# Patient Record
Sex: Male | Born: 1945 | Race: White | Hispanic: No | Smoking: Current every day smoker
Health system: Southern US, Community
[De-identification: ages and names within clinical notes are randomized; demographics above are authoritative.]

## PROBLEM LIST (undated history)

## (undated) DIAGNOSIS — I619 Nontraumatic intracerebral hemorrhage, unspecified: Secondary | ICD-10-CM

## (undated) DIAGNOSIS — I10 Essential (primary) hypertension: Secondary | ICD-10-CM

## (undated) DIAGNOSIS — I639 Cerebral infarction, unspecified: Secondary | ICD-10-CM

## (undated) DIAGNOSIS — Z982 Presence of cerebrospinal fluid drainage device: Secondary | ICD-10-CM

## (undated) DIAGNOSIS — Z720 Tobacco use: Secondary | ICD-10-CM

## (undated) DIAGNOSIS — E785 Hyperlipidemia, unspecified: Secondary | ICD-10-CM

## (undated) HISTORY — PX: OTHER SURGICAL HISTORY: SHX169

## (undated) HISTORY — PX: VENTRICULOPERITONEAL SHUNT: SHX204

## (undated) HISTORY — DX: Cerebral infarction, unspecified: I63.9

---

## 2016-11-15 ENCOUNTER — Observation Stay (HOSPITAL_COMMUNITY)
Admission: EM | Admit: 2016-11-15 | Discharge: 2016-11-17 | Disposition: A | Discharge status: Home / Self Care | Payer: Medicare Other | Attending: Internal Medicine | Admitting: Internal Medicine

## 2016-11-15 ENCOUNTER — Encounter (HOSPITAL_COMMUNITY): Payer: Self-pay | Admitting: Internal Medicine

## 2016-11-15 ENCOUNTER — Emergency Department (HOSPITAL_COMMUNITY): Payer: Medicare Other

## 2016-11-15 DIAGNOSIS — K7290 Hepatic failure, unspecified without coma: Secondary | ICD-10-CM | POA: Insufficient documentation

## 2016-11-15 DIAGNOSIS — I700 Atherosclerosis of aorta: Secondary | ICD-10-CM | POA: Diagnosis not present

## 2016-11-15 DIAGNOSIS — F17200 Nicotine dependence, unspecified, uncomplicated: Secondary | ICD-10-CM | POA: Insufficient documentation

## 2016-11-15 DIAGNOSIS — Z7982 Long term (current) use of aspirin: Secondary | ICD-10-CM | POA: Insufficient documentation

## 2016-11-15 DIAGNOSIS — I69354 Hemiplegia and hemiparesis following cerebral infarction affecting left non-dominant side: Secondary | ICD-10-CM | POA: Diagnosis not present

## 2016-11-15 DIAGNOSIS — E785 Hyperlipidemia, unspecified: Secondary | ICD-10-CM | POA: Diagnosis not present

## 2016-11-15 DIAGNOSIS — Z91013 Allergy to seafood: Secondary | ICD-10-CM | POA: Insufficient documentation

## 2016-11-15 DIAGNOSIS — I4510 Unspecified right bundle-branch block: Secondary | ICD-10-CM | POA: Insufficient documentation

## 2016-11-15 DIAGNOSIS — I10 Essential (primary) hypertension: Secondary | ICD-10-CM | POA: Diagnosis present

## 2016-11-15 DIAGNOSIS — Z801 Family history of malignant neoplasm of trachea, bronchus and lung: Secondary | ICD-10-CM | POA: Insufficient documentation

## 2016-11-15 DIAGNOSIS — R441 Visual hallucinations: Secondary | ICD-10-CM

## 2016-11-15 DIAGNOSIS — I619 Nontraumatic intracerebral hemorrhage, unspecified: Secondary | ICD-10-CM | POA: Diagnosis not present

## 2016-11-15 DIAGNOSIS — Z8249 Family history of ischemic heart disease and other diseases of the circulatory system: Secondary | ICD-10-CM | POA: Diagnosis not present

## 2016-11-15 DIAGNOSIS — G9341 Metabolic encephalopathy: Secondary | ICD-10-CM | POA: Diagnosis not present

## 2016-11-15 DIAGNOSIS — Z720 Tobacco use: Secondary | ICD-10-CM | POA: Diagnosis present

## 2016-11-15 DIAGNOSIS — K7682 Hepatic encephalopathy: Secondary | ICD-10-CM

## 2016-11-15 DIAGNOSIS — G9389 Other specified disorders of brain: Secondary | ICD-10-CM | POA: Insufficient documentation

## 2016-11-15 DIAGNOSIS — R778 Other specified abnormalities of plasma proteins: Secondary | ICD-10-CM | POA: Insufficient documentation

## 2016-11-15 DIAGNOSIS — Z8673 Personal history of transient ischemic attack (TIA), and cerebral infarction without residual deficits: Secondary | ICD-10-CM

## 2016-11-15 DIAGNOSIS — Z79899 Other long term (current) drug therapy: Secondary | ICD-10-CM | POA: Diagnosis not present

## 2016-11-15 DIAGNOSIS — E876 Hypokalemia: Secondary | ICD-10-CM | POA: Diagnosis not present

## 2016-11-15 DIAGNOSIS — Z982 Presence of cerebrospinal fluid drainage device: Secondary | ICD-10-CM

## 2016-11-15 DIAGNOSIS — Z800 Family history of malignant neoplasm of digestive organs: Secondary | ICD-10-CM | POA: Insufficient documentation

## 2016-11-15 DIAGNOSIS — R7989 Other specified abnormal findings of blood chemistry: Secondary | ICD-10-CM | POA: Diagnosis present

## 2016-11-15 HISTORY — DX: Presence of cerebrospinal fluid drainage device: Z98.2

## 2016-11-15 HISTORY — DX: Tobacco use: Z72.0

## 2016-11-15 HISTORY — DX: Hyperlipidemia, unspecified: E78.5

## 2016-11-15 HISTORY — DX: Nontraumatic intracerebral hemorrhage, unspecified: I61.9

## 2016-11-15 HISTORY — DX: Essential (primary) hypertension: I10

## 2016-11-15 LAB — URINALYSIS, ROUTINE W REFLEX MICROSCOPIC
Bilirubin Urine: NEGATIVE
GLUCOSE, UA: NEGATIVE mg/dL
Ketones, ur: NEGATIVE mg/dL
NITRITE: NEGATIVE
Protein, ur: NEGATIVE mg/dL
SPECIFIC GRAVITY, URINE: 1.01 (ref 1.005–1.030)
pH: 6 (ref 5.0–8.0)

## 2016-11-15 LAB — COMPREHENSIVE METABOLIC PANEL
ALBUMIN: 3.5 g/dL (ref 3.5–5.0)
ALK PHOS: 142 U/L — AB (ref 38–126)
ALT: 12 U/L — ABNORMAL LOW (ref 17–63)
ANION GAP: 9 (ref 5–15)
AST: 14 U/L — ABNORMAL LOW (ref 15–41)
BILIRUBIN TOTAL: 0.8 mg/dL (ref 0.3–1.2)
BUN: 12 mg/dL (ref 6–20)
CALCIUM: 8.7 mg/dL — AB (ref 8.9–10.3)
CO2: 25 mmol/L (ref 22–32)
Chloride: 108 mmol/L (ref 101–111)
Creatinine, Ser: 1.13 mg/dL (ref 0.61–1.24)
GFR calc Af Amer: >60 mL/min (ref >60)
GFR calc non Af Amer: >60 mL/min (ref >60)
GLUCOSE: 95 mg/dL (ref 65–99)
Potassium: 2.9 mmol/L — ABNORMAL LOW (ref 3.5–5.1)
Sodium: 142 mmol/L (ref 135–145)
TOTAL PROTEIN: 6.4 g/dL — AB (ref 6.5–8.1)

## 2016-11-15 LAB — CBC WITH DIFFERENTIAL/PLATELET
BASOS PCT: 1 %
Basophils Absolute: 0.1 10*3/uL (ref 0.0–0.1)
EOS ABS: 0.2 10*3/uL (ref 0.0–0.7)
Eosinophils Relative: 3 %
HCT: 49.6 % (ref 39.0–52.0)
HEMOGLOBIN: 16.4 g/dL (ref 13.0–17.0)
Lymphocytes Relative: 31 %
Lymphs Abs: 3 10*3/uL (ref 0.7–4.0)
MCH: 28.4 pg (ref 26.0–34.0)
MCHC: 33.1 g/dL (ref 30.0–36.0)
MCV: 85.8 fL (ref 78.0–100.0)
MONOS PCT: 9 %
Monocytes Absolute: 0.8 10*3/uL (ref 0.1–1.0)
NEUTROS PCT: 56 %
Neutro Abs: 5.3 10*3/uL (ref 1.7–7.7)
PLATELETS: 268 10*3/uL (ref 150–400)
RBC: 5.78 MIL/uL (ref 4.22–5.81)
RDW: 13.9 % (ref 11.5–15.5)
WBC: 9.4 10*3/uL (ref 4.0–10.5)

## 2016-11-15 LAB — RAPID URINE DRUG SCREEN, HOSP PERFORMED
AMPHETAMINES: NOT DETECTED
BARBITURATES: NOT DETECTED
BENZODIAZEPINES: NOT DETECTED
Cocaine: NOT DETECTED
Opiates: NOT DETECTED
Tetrahydrocannabinol: NOT DETECTED

## 2016-11-15 LAB — PROTIME-INR
INR: 1.12
PROTHROMBIN TIME: 14.5 s (ref 11.4–15.2)

## 2016-11-15 LAB — ETHANOL: Alcohol, Ethyl (B): <5 mg/dL (ref <5)

## 2016-11-15 LAB — TROPONIN I: TROPONIN I: 0.03 ng/mL — AB (ref <0.03)

## 2016-11-15 LAB — AMMONIA: AMMONIA: 90 umol/L — AB (ref 9–35)

## 2016-11-15 MED ORDER — AMLODIPINE BESYLATE 5 MG PO TABS
10.0000 mg | ORAL_TABLET | Freq: Every day | ORAL | Status: DC
  Administered 2016-11-16 – 2016-11-17 (×2): 10 mg via ORAL
  Filled 2016-11-15 (×2): qty 2

## 2016-11-15 MED ORDER — ACETAMINOPHEN 325 MG PO TABS: 650.0000 mg | ORAL_TABLET | ORAL | Status: DC | PRN

## 2016-11-15 MED ORDER — ATORVASTATIN CALCIUM 40 MG PO TABS
40.0000 mg | ORAL_TABLET | Freq: Every day | ORAL | Status: DC
Start: 2016-11-16 — End: 2016-11-17
  Administered 2016-11-16: 40 mg via ORAL
  Filled 2016-11-15: qty 1

## 2016-11-15 MED ORDER — SENNOSIDES-DOCUSATE SODIUM 8.6-50 MG PO TABS: 1.0000 | ORAL_TABLET | Freq: Every evening | ORAL | Status: DC | PRN

## 2016-11-15 MED ORDER — POTASSIUM CHLORIDE CRYS ER 10 MEQ PO TBCR
10.0000 meq | EXTENDED_RELEASE_TABLET | Freq: Every day | ORAL | Status: DC
  Administered 2016-11-16 – 2016-11-17 (×2): 10 meq via ORAL
  Filled 2016-11-15 (×2): qty 1

## 2016-11-15 MED ORDER — POTASSIUM CHLORIDE 20 MEQ/15ML (10%) PO SOLN
40.0000 meq | Freq: Once | ORAL | Status: AC
Start: 2016-11-15 — End: 2016-11-15
  Administered 2016-11-15: 40 meq via ORAL
  Filled 2016-11-15: qty 30

## 2016-11-15 MED ORDER — POTASSIUM CHLORIDE 10 MEQ/100ML IV SOLN
10.0000 meq | INTRAVENOUS | Status: AC
  Administered 2016-11-15 (×3): 10 meq via INTRAVENOUS
  Filled 2016-11-15 (×3): qty 100

## 2016-11-15 MED ORDER — NICOTINE 21 MG/24HR TD PT24
21.0000 mg | MEDICATED_PATCH | Freq: Every day | TRANSDERMAL | Status: DC
Start: 2016-11-15 — End: 2016-11-17
  Administered 2016-11-17: 21 mg via TRANSDERMAL
  Filled 2016-11-15 (×3): qty 1

## 2016-11-15 MED ORDER — ZOLPIDEM TARTRATE 5 MG PO TABS: 5.0000 mg | ORAL_TABLET | Freq: Every evening | ORAL | Status: DC | PRN

## 2016-11-15 MED ORDER — LISINOPRIL 20 MG PO TABS
20.0000 mg | ORAL_TABLET | Freq: Every day | ORAL | Status: DC
  Administered 2016-11-16 – 2016-11-17 (×2): 20 mg via ORAL
  Filled 2016-11-15 (×2): qty 1

## 2016-11-15 MED ORDER — ACETAMINOPHEN 650 MG RE SUPP: 650.0000 mg | RECTAL | Status: DC | PRN

## 2016-11-15 MED ORDER — ASPIRIN EC 81 MG PO TBEC
81.0000 mg | DELAYED_RELEASE_TABLET | Freq: Every day | ORAL | Status: DC
  Administered 2016-11-16 – 2016-11-17 (×2): 81 mg via ORAL
  Filled 2016-11-15 (×2): qty 1

## 2016-11-15 MED ORDER — LACTULOSE 10 GM/15ML PO SOLN
20.0000 g | Freq: Three times a day (TID) | ORAL | Status: DC
  Administered 2016-11-15 – 2016-11-17 (×5): 20 g via ORAL
  Filled 2016-11-15 (×5): qty 30

## 2016-11-15 MED ORDER — FUROSEMIDE 20 MG PO TABS
20.0000 mg | ORAL_TABLET | Freq: Every day | ORAL | Status: DC
Start: 2016-11-16 — End: 2016-11-17
  Administered 2016-11-16 – 2016-11-17 (×2): 20 mg via ORAL
  Filled 2016-11-15 (×2): qty 1

## 2016-11-15 MED ORDER — BACLOFEN 10 MG PO TABS: 10.0000 mg | ORAL_TABLET | Freq: Three times a day (TID) | ORAL | Status: DC

## 2016-11-15 MED ORDER — MAGNESIUM SULFATE IN D5W 1-5 GM/100ML-% IV SOLN
1.0000 g | Freq: Once | INTRAVENOUS | Status: AC
  Administered 2016-11-15: 1 g via INTRAVENOUS
  Filled 2016-11-15: qty 100

## 2016-11-15 MED ORDER — ACETAMINOPHEN 160 MG/5ML PO SOLN: 650.0000 mg | ORAL | Status: DC | PRN

## 2016-11-15 MED ORDER — ENOXAPARIN SODIUM 40 MG/0.4ML ~~LOC~~ SOLN
40.0000 mg | SUBCUTANEOUS | Status: DC
  Administered 2016-11-16: 40 mg via SUBCUTANEOUS
  Filled 2016-11-15 (×2): qty 0.4

## 2016-11-15 MED ORDER — ONDANSETRON HCL 4 MG/2ML IJ SOLN: 4.0000 mg | Freq: Three times a day (TID) | INTRAMUSCULAR | Status: DC | PRN

## 2016-11-15 MED ORDER — STROKE: EARLY STAGES OF RECOVERY BOOK
Freq: Once | Status: AC
  Administered 2016-11-16
  Filled 2016-11-15 (×2): qty 1

## 2016-11-15 MED ORDER — SODIUM CHLORIDE 0.9 % IV SOLN
INTRAVENOUS | Status: DC
  Administered 2016-11-15: via INTRAVENOUS

## 2016-11-15 MED ORDER — METOPROLOL TARTRATE 25 MG PO TABS
100.0000 mg | ORAL_TABLET | Freq: Two times a day (BID) | ORAL | Status: DC
  Administered 2016-11-16 – 2016-11-17 (×4): 100 mg via ORAL
  Filled 2016-11-15 (×4): qty 4

## 2016-11-15 MED ORDER — HYDRALAZINE HCL 20 MG/ML IJ SOLN: 5.0000 mg | INTRAMUSCULAR | Status: DC | PRN

## 2016-11-15 MED ORDER — IOPAMIDOL (ISOVUE-370) INJECTION 76%
INTRAVENOUS | Status: AC
  Filled 2016-11-15: qty 50

## 2016-11-15 NOTE — ED Triage Notes (Signed)
Pt. Coming from home via [facility] for odd behavior x3 days. Pt. Neighbor states he is not at baseline. Pt. Hx of stroke with left-sided deficits. Pt. Takes care of himself at home with no issues usually. Pt. Hallucinating yesterday stating there's bugs all over the floor and foam coming from his baseboards. Pt. Aox4 and follows commands. EMS reports 12 lead RBBB.

## 2016-11-15 NOTE — ED Provider Notes (Signed)
[facility]EMERGENCY DEPT Provider Note   CSN: [id number] Arrival date & time: 11/15/16  1324     History   Chief Complaint Chief Complaint  Patient presents with  . Altered Mental Status    HPI Joel Solis is a 71 y.o. male.  Joel Solis is a 71 y.o. Male with a history of CVA with left sided deficits who  After home health reports patient has not been acting normally recently. According to home health patient has  Had visual hallucinations of bugs and found coming from his floorboards. Patient reports he is feeling totally fine. He denies any complaints. He does report to me that he has seen bugs and foam coming out of his floorboards. He reports millions of bugs from his floorboards. He tells me no else has seen them because they leave when other people are looking for them. 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 He denies any physical complaints. He denies fevers, chest pain, shortness of breath, abdominal pain, nausea, vomiting, urinary symptoms, rashes, auditory hallucinations.    The history is provided by the patient and the EMS personnel. No language interpreter was used.  Altered Mental Status   Associated symptoms include hallucinations.    No past medical history on file.  There are no active problems to display for this patient.   No past surgical history on file.     Home Medications    Prior to Admission medications   Not on File    Family History No family history on file.  Social History Social History  Substance Use Topics  . Smoking status: Not on file  . Smokeless tobacco: Not on file  . Alcohol use Not on file     Allergies   Shellfish allergy   Review of Systems Review of Systems  Constitutional: Negative for chills and fever.  HENT: Negative for congestion and sore throat.   Eyes: Negative for visual disturbance.  Respiratory: Negative for cough, shortness of breath and wheezing.   Cardiovascular: Negative for chest  pain.  Gastrointestinal: Negative for abdominal pain, diarrhea, nausea and vomiting.  Genitourinary: Negative for dysuria.  Musculoskeletal: Negative for back pain and neck pain.  Skin: Negative for rash.  Neurological: Negative for headaches.  Psychiatric/Behavioral: Positive for hallucinations. Negative for suicidal ideas.     Physical Exam Updated Vital Signs BP 121/80   Pulse (!) 57   Resp 17   SpO2 100%   Physical Exam  Constitutional: He is oriented to person, place, and time. He appears well-developed and well-nourished. No distress.  Nontoxic appearing.  HENT:  Head: Normocephalic and atraumatic.  Mouth/Throat: Oropharynx is clear and moist.  Eyes: Conjunctivae are normal. Pupils are equal, round, and reactive to light. Right eye exhibits no discharge. Left eye exhibits no discharge.  Neck: Neck supple. No JVD present.  Cardiovascular: Normal rate, regular rhythm, normal heart sounds and intact distal pulses.   Pulmonary/Chest: Effort normal and breath sounds normal. No stridor. No respiratory distress. He has no wheezes. He has no rales.  Lungs are clear to ascultation bilaterally. Symmetric chest expansion bilaterally. No increased work of breathing. No rales or rhonchi.    Abdominal: Soft. There is no tenderness. There is no guarding.  Musculoskeletal: He exhibits no edema.  Lymphadenopathy:    He has no cervical adenopathy.  Neurological: He is alert and oriented to person, place, and time. No cranial nerve deficit. Coordination normal.  Patient is alert and oriented 3. Speech is clear and coherent.  His left arm and leg weakness which is chronic from her previous stroke. No facial droop.  Skin: Skin is warm and dry. Capillary refill takes less than 2 seconds. No rash noted. He is not diaphoretic. No erythema. No pallor.  Psychiatric: He has a normal mood and affect. His behavior is normal. Thought content is not paranoid. He expresses no homicidal and no suicidal  ideation.   His speech is clear and coherent. He does endorse seeing foam and no evidence of bugs coming from his floorboards at home. He denies any current hallucinations. He denies auditory hallucinations. He does not appear to be responding to internal stimuli.  Nursing note and vitals reviewed.    ED Treatments / Results  Labs (all labs ordered are listed, but only abnormal results are displayed) Labs Reviewed  COMPREHENSIVE METABOLIC PANEL - Abnormal; Notable for the following:       Result Value   Potassium 2.9 (*)    Calcium 8.7 (*)    Total Protein 6.4 (*)    AST 14 (*)    ALT 12 (*)    Alkaline Phosphatase 142 (*)    All other components within normal limits  URINALYSIS, ROUTINE W REFLEX MICROSCOPIC - Abnormal; Notable for the following:    Hgb urine dipstick SMALL (*)    Leukocytes, UA SMALL (*)    Bacteria, UA RARE (*)    Squamous Epithelial / LPF 0-5 (*)    Non Squamous Epithelial 0-5 (*)    All other components within normal limits  AMMONIA - Abnormal; Notable for the following:    Ammonia 90 (*)    All other components within normal limits  ETHANOL  CBC WITH DIFFERENTIAL/PLATELET  RAPID URINE DRUG SCREEN, HOSP PERFORMED    EKG  EKG Interpretation  Date/Time:  Tuesday November 15 2016 13:47:12 EDT Ventricular Rate:  59 PR Interval:    QRS Duration: 158 QT Interval:  478 QTC Calculation: 474 R Axis:   68 Text Interpretation:  Sinus rhythm Prolonged PR interval Right bundle branch block Borderline ST depression, lateral leads No previous ECGs available Confirmed by Richardean Canal [phone]) on 11/15/2016 1:49:34 PM Also confirmed by Richardean Canal [phone]), editor Misty Stanley [phone])  on 11/15/2016 1:57:46 PM       Radiology Dg Chest 2 View  Result Date: 11/15/2016 CLINICAL DATA:  Mental status change which is unusual for the patient. EXAM: CHEST  2 VIEW COMPARISON:  None in PACs FINDINGS: The lungs are adequately inflated and clear. The heart is top-normal  in size. The pulmonary vascularity is normal. The mediastinum is normal in width. There is no pleural effusion. There is calcification in the wall of the aortic arch. The bony thorax is unremarkable. IMPRESSION: Top-normal cardiac size without evidence of pulmonary edema. Thoracic aortic atherosclerosis. Electronically Signed   By: David  Swaziland M.D.   On: 11/15/2016 14:25   Ct Head Wo Contrast  Result Date: 11/15/2016 CLINICAL DATA:  Altered mental status, some left-sided weakness since prior stroke EXAM: CT HEAD WITHOUT CONTRAST TECHNIQUE: Contiguous axial images were obtained from the base of the skull through the vertex without intravenous contrast. COMPARISON:  None. FINDINGS: Brain: The ventricular system his min prominent as are the cortical sulci indicating diffuse atrophy. Ventriculostomy catheter enters from the right frontal region with the tip in the posterior aspect of the right lateral ventricle. The septum is midline in position. Moderate small vessel ischemic changes noted throughout the periventricular white matter. Vascular: No vascular abnormality  is noted on this unenhanced study. Skull: On bone window images, no calvarial abnormality is seen. Sinuses/Orbits: Paranasal sinuses appear clear. Other: None. IMPRESSION: Atrophy and moderate small vessel ischemic change. VP shunt present. No acute intracranial abnormality. Electronically Signed   By: Dwyane DeePaul  Barry M.D.   On: 11/15/2016 14:55    Procedures Procedures (including critical care time)  Medications Ordered in ED Medications  potassium chloride 10 mEq in 100 mL IVPB (10 mEq Intravenous New Bag/Given 11/15/16 1605)     Initial Impression / Assessment and Plan / ED Course  I have reviewed the triage vital signs and the nursing notes.  Pertinent labs & imaging results that were available during my care of the patient were reviewed by me and considered in my medical decision making (see chart for details).    This  is a 71 y.o.  Male with a history of CVA with left sided deficits who  After home health reports patient has not been acting normally recently. According to home health patient has  Had visual hallucinations of bugs and found coming from his floorboards. Patient reports he is feeling totally fine. He denies any complaints. He does report to me that he has seen bugs and foam coming out of his floorboards. He reports millions of bugs from his floorboards. He tells me no else has seen them because they leave when other people are looking for them. On exam patient is afebrile nontoxic appearing. He is alert and oriented 4. He does report seeing these bugs at home. He has no active hallucinations. He does not appear to be responded to internal stimuli. He is pleasant.  Workup here is remarkable for an elevated ammonia level. This may be contributing to his symptoms. CT of his head does show a VP shunt. Plan is for MRI to rule out any new stroke. Plan for admission.  At shift change patient care is signed out to oncoming team. Awaiting admission.   Final Clinical Impressions(s) / ED Diagnoses   Final diagnoses:  Visual hallucinations  History of CVA (cerebrovascular accident)  S/P VP shunt  Hepatic encephalopathy (HCC)  Increased ammonia level    New Prescriptions New Prescriptions   No medications on file     Everlene FarrierDansie, Brylin Stopper, Cordelia Poche-C 11/15/16 1715    Charlynne PanderYao, David Hsienta, MD 11/17/16 [phone]

## 2016-11-15 NOTE — ED Notes (Signed)
Pt signed consent to receive medical records from [place] in [place].

## 2016-11-15 NOTE — ED Notes (Signed)
Medical records sent to radiology for radiologist to review to see if pt is able to get MRI done with shunt in brain

## 2016-11-15 NOTE — ED Notes (Signed)
Gave patient a urinal patient is resting 

## 2016-11-15 NOTE — H&P (Addendum)
History and Physical    Joel PearsonDaniel Cooprider ZOX:[id number] DOB: [birth year] DOA: 11/15/2016  Referring MD/NP/PA:   PCP: [facility]   Patient coming from:  The patient is coming from home.  At baseline, pt is independent for most of ADL.   Chief Complaint: confusion and hallucination  HPI: Joel Solis is a 71 y.o. male with medical history significant of hemorrhagic stroke with left-sided weakness and a mild slurred speech, s/p of VP shunt, hypertension, hyperlipidemia, who presents with confusion and hallucination.  Per pt's daughter, pt has been confused in the past 3 days. He has abnormal behavior and hallucination. He states that there are bugs all over the floor and foam coming from his baseboards. He has chronic left-sided weakness and mild slurred speech from previous stroke, which has not changed. Patient does not have new vision change, hearing loss. No new weakness or numbness in his extremities. Patient does not have chest pain, SOB, cough, fever or chills. No nausea, vomiting, diarrhea, abdominal pain, symptoms of UTI. When I saw pt in ED, he is oriented x 3.   ED Course: pt was found to have WBC 9.4, negative UDS, alcohol level less than 5, ammonia 90, LFT normal, potassium 2.9, creatinine 1.13, urinalysis with small amount of leukocytes, chest x-rays negative, oxygen saturation 99% on room air. CT head is negative for acute intracranial abnormalities, but showed atrophy and moderate small vessel ischemic change and VP shunt.  Review of Systems:   General: no fevers, chills, no changes in body weight, has fatigue HEENT: no blurry vision, hearing changes or sore throat Respiratory: no dyspnea, coughing, wheezing CV: no chest pain, no palpitations GI: no nausea, vomiting, abdominal pain, diarrhea, constipation GU: no dysuria, burning on urination, increased urinary frequency, hematuria  Ext: no leg edema Neuro: has left sided weakness. Has hallucination  and confusion. No vision change or hearing loss Skin: no rash, no skin tear. MSK: No muscle spasm, no deformity, no limitation of range of movement in spin Heme: No easy bruising.  Travel history: No recent long distant travel.  Allergy:  Allergies  Allergen Reactions  . Shellfish Allergy Nausea And Vomiting    Past Medical History:  Diagnosis Date  . Essential hypertension   . Hemorrhagic stroke (HCC)   . HLD (hyperlipidemia)   . S/P VP shunt   . Tobacco abuse     Past Surgical History:  Procedure Laterality Date  . cyst      cyst in neck  . VENTRICULOPERITONEAL SHUNT      Social History:  reports that he has been smoking.  He has never used smokeless tobacco. He reports that he drinks alcohol. He reports that he does not use drugs.  Family History:  Family History  Problem Relation Age of Onset  . Intracerebral hemorrhage Mother   . Lung cancer Father   . Esophageal cancer Brother      Prior to Admission medications   Medication Sig Start Date End Date Taking? Authorizing Provider  amLODipine (NORVASC) 10 MG tablet Take 10 mg by mouth daily.   Yes [provider]  aspirin EC 81 MG tablet Take 81 mg by mouth daily.   Yes [provider]  atorvastatin (LIPITOR) 40 MG tablet Take 40 mg by mouth daily.   Yes [provider]  baclofen (LIORESAL) 10 MG tablet Take 10 mg by mouth 3 (three) times daily.   Yes [provider]  furosemide (LASIX) 20 MG tablet Take 20 mg  by mouth.   Yes [provider]  lisinopril (PRINIVIL,ZESTRIL) 20 MG tablet Take 20 mg by mouth daily.   Yes [provider]  metoprolol tartrate (LOPRESSOR) 100 MG tablet Take 100 mg by mouth 2 (two) times daily.   Yes [provider]  potassium chloride (K-DUR,KLOR-CON) 10 MEQ tablet Take 10 mEq by mouth daily.   Yes [provider]    Physical Exam: Vitals:   11/15/16 2142 11/15/16 2144 11/15/16 2226 11/16/16 0001  BP:  138/78 (!)  150/72 (!) 150/80  Pulse:  67 61 67  Resp:  12 18   Temp: 97.8 F (36.6 C)  97.8 F (36.6 C)   TempSrc:   Oral   SpO2:  100% 99%   Weight:   90.9 kg (200 lb 8 oz)   Height:   5\' 8"  (1.727 m)    General: Not in acute distress HEENT:       Eyes: PERRL, EOMI, no scleral icterus.       ENT: No discharge from the ears and nose, no pharynx injection, no tonsillar enlargement.        Neck: No JVD, no bruit, no mass felt. Heme: No neck lymph node enlargement. Cardiac: S1/S2, RRR, No murmurs, No gallops or rubs. Respiratory: No rales, wheezing, rhonchi or rubs. GI: Soft, nondistended, nontender, no rebound pain, no organomegaly, BS present. GU: No hematuria Ext: No pitting leg edema bilaterally. 2+DP/PT pulse bilaterally. Musculoskeletal: No joint deformities, No joint redness or warmth, no limitation of ROM in spin. Skin: No rashes.  Neuro: Alert, oriented X3, cranial nerves II-XII grossly intact. Muscle strength 1/5 in left arm and 4/5 in left leg, 5/5 in right extremities, sensation to light touch intact. Brachial reflex 2+ bilaterally Psych: Patient is not psychotic, no suicidal or hemocidal ideation.  Labs on Admission: I have personally reviewed following labs and imaging studies  CBC:  Recent Labs Lab 11/15/16 1400  WBC 9.4  NEUTROABS 5.3  HGB 16.4  HCT 49.6  MCV 85.8  PLT 268   Basic Metabolic Panel:  Recent Labs Lab 11/15/16 1400  NA 142  K 2.9*  CL 108  CO2 25  GLUCOSE 95  BUN 12  CREATININE 1.13  CALCIUM 8.7*   GFR: Estimated Creatinine Clearance: 65.6 mL/min (by C-G formula based on SCr of 1.13 mg/dL). Liver Function Tests:  Recent Labs Lab 11/15/16 1400  AST 14*  ALT 12*  ALKPHOS 142*  BILITOT 0.8  PROT 6.4*  ALBUMIN 3.5   No results for input(s): LIPASE, AMYLASE in the last 168 hours.  Recent Labs Lab 11/15/16 1601  AMMONIA 90*   Coagulation Profile:  Recent Labs Lab 11/15/16 2045  INR 1.12   Cardiac Enzymes:  Recent Labs Lab  11/15/16 2052  TROPONINI 0.03*   BNP (last 3 results) No results for input(s): PROBNP in the last 8760 hours. HbA1C: No results for input(s): HGBA1C in the last 72 hours. CBG: No results for input(s): GLUCAP in the last 168 hours. Lipid Profile: No results for input(s): CHOL, HDL, LDLCALC, TRIG, CHOLHDL, LDLDIRECT in the last 72 hours. Thyroid Function Tests: No results for input(s): TSH, T4TOTAL, FREET4, T3FREE, THYROIDAB in the last 72 hours. Anemia Panel: No results for input(s): VITAMINB12, FOLATE, FERRITIN, TIBC, IRON, RETICCTPCT in the last 72 hours. Urine analysis:    Component Value Date/Time   COLORURINE YELLOW 11/15/2016 1606   APPEARANCEUR CLEAR 11/15/2016 1606   LABSPEC 1.010 11/15/2016 1606   PHURINE 6.0 11/15/2016 1606  GLUCOSEU NEGATIVE 11/15/2016 1606   HGBUR SMALL (A) 11/15/2016 1606   BILIRUBINUR NEGATIVE 11/15/2016 1606   KETONESUR NEGATIVE 11/15/2016 1606   PROTEINUR NEGATIVE 11/15/2016 1606   NITRITE NEGATIVE 11/15/2016 1606   LEUKOCYTESUR SMALL (A) 11/15/2016 1606   Sepsis Labs: @LABRCNTIP (procalcitonin:4,lacticidven:4) )No results found for this or any previous visit (from the past 240 hour(s)).   Radiological Exams on Admission: Dg Chest 2 View  Result Date: 11/15/2016 CLINICAL DATA:  Mental status change which is unusual for the patient. EXAM: CHEST  2 VIEW COMPARISON:  None in PACs FINDINGS: The lungs are adequately inflated and clear. The heart is top-normal in size. The pulmonary vascularity is normal. The mediastinum is normal in width. There is no pleural effusion. There is calcification in the wall of the aortic arch. The bony thorax is unremarkable. IMPRESSION: Top-normal cardiac size without evidence of pulmonary edema. Thoracic aortic atherosclerosis. Electronically Signed   By: David  [place]iland M.D.   On: 11/15/2016 14:25   Ct Head Wo Contrast  Result Date: 11/15/2016 CLINICAL DATA:  Altered mental status, some left-sided weakness since prior  stroke EXAM: CT HEAD WITHOUT CONTRAST TECHNIQUE: Contiguous axial images were obtained from the base of the skull through the vertex without intravenous contrast. COMPARISON:  None. FINDINGS: Brain: The ventricular system his min prominent as are the cortical sulci indicating diffuse atrophy. Ventriculostomy catheter enters from the right frontal region with the tip in the posterior aspect of the right lateral ventricle. The septum is midline in position. Moderate small vessel ischemic changes noted throughout the periventricular white matter. Vascular: No vascular abnormality is noted on this unenhanced study. Skull: On bone window images, no calvarial abnormality is seen. Sinuses/Orbits: Paranasal sinuses appear clear. Other: None. IMPRESSION: Atrophy and moderate small vessel ischemic change. VP shunt present. No acute intracranial abnormality. Electronically Signed   By: Dwyane Dee M.D.   On: 11/15/2016 14:55     EKG: Independently reviewed.  Sinus rhythm, QTC 474, right bundle blockage, ST depression in inferior leads, V3-V5  Assessment/Plan Principal Problem:   Acute metabolic encephalopathy Active Problems:   Essential hypertension   HLD (hyperlipidemia)   Hemorrhagic stroke (HCC)   S/P VP shunt   Tobacco abuse   Hypokalemia   Elevated troponin   Acute metabolic encephalopathy: Etiology is not clear. UDS negative, alcohol level less than 5. CT head is negative for acute intracranial abnormalities. Differential diagnosis includes frontal lobe stroke and delirium. His ammonia level is elevated at 90, but liver function is okay, does not seem to have hepatic encephalopathy. Pt cannot do MRI of the brain due to presents of VP shunt.  -will admit to tele bed for obs -will get CTA of neck and head-->if negative, may consult [facility] in AM -swallowing screen. -PT/OT -start Lactulose 20 g tid -hold baclofen  Addendum: per RN report, radiologist Dr. Chase Picket approved the MRI to be done for this  patient with the shunt.  -will get MRI of brain instead of CTA of head and neck.  Elevated trop: trop 0.03. No CP. - cycle CE q6 x3 and repeat EKG in the am  - prn Nitroglycerin, and aspirin, lipitor, metoprolol - Risk factor stratification: will check FLP and A1C  - 2d echo  Hemorrhagic stroke: has sequela of left sided weakness and mild slurred speech -continue ASA and lipitor  HLD: -Lipitor  Hypokalemia: K= 2.9 on admission.  - Repleted K - Give 1 gram of magnesium sulfate - Check Mg level  Essential hypertension: -continue  amlodipine, lisinopril, metoprolol, lasix -IV hydralazine when necessary  Tobacco abuse: -Did counseling about importance of quitting smoking -Nicotine patch   DVT ppx: SQ Lovenox Code Status: Full code Family Communication:  Yes, patient's daughter and son-in law at bed side Disposition Plan:  Anticipate discharge back to previous home environment Consults called:  none Admission status: Obs / tele    Date of Service 11/16/2016    Lorretta Harp [facility] Pager [phone]  If 7PM-7AM, please contact night-coverage [email or web address] Password [facility] 11/16/2016, 12:38 AM

## 2016-11-15 NOTE — ED Notes (Signed)
Patient transported to X-ray 

## 2016-11-15 NOTE — ED Provider Notes (Signed)
Received transfer of care at end of shift from Will Dansie, PA-C pending MRI brain and TTS eval. Patient was seen today for altered mental status and hallucinations. He is currently asymptomatic and alert and oriented.  MRI ordered to rule out new CVA.  Ammonia returned elevated and patient will need to be admitted for hepatic encephalopathy. Plan for admission Spoke to Dr. Susie CassetteAbrol who will be admitting patient. Patient is refusing to be admitted but agrees to MRI and will accept admission if anything abnormal on MRI. Patient eventually accepted admission pending MRI brain.  Georgiana ShoreMitchell, Khayden Herzberg B, PA-C 11/15/16 1741    Georgiana ShoreMitchell, Damareon Lanni B, PA-C 11/16/16 0129    Vanetta MuldersZackowski, Scott, MD 11/20/16 [phone]

## 2016-11-15 NOTE — ED Notes (Signed)
Admitting provider at bedside.

## 2016-11-15 NOTE — Progress Notes (Addendum)
Patient arrived to floor with plan for MRI of brain ordered by Robinette HainesJessica Mitchell,PA. This MRI had been cancelled multiple times throughout the day and finally placed again at 2127. Pt has a VP shunt placed in his brain. RN called Niu,MD and questioned MD about this test. MD asked me who ordered this test and this RN told him that Robinette HainesJessica Mitchell,PA had ordered the MRI. MD informed RN to find out the name of the radiologist who approved the MRI and make a note about it due to the risks related to having a shunt and going through the MRI machine. This RN called MRI and spoke with Aundra MilletMegan about the orders for this MRI and about the patient's shunt. Megan told this RN that since a CT of the head had already been, the MD can d/c this order and that the radiologist had approved the MRI for this patient with a shunt. Megan told me that the radiologist Dr.Herman approved the MRI to be done for this patient with the shunt. Niu,MD notified and he told this RN that he would prefer to have the MRI done in addition to the CT of the head because it gives more information. Charge RN Hilda LiasMarie is aware of this situation.

## 2016-11-16 ENCOUNTER — Observation Stay (HOSPITAL_COMMUNITY): Payer: Medicare Other

## 2016-11-16 DIAGNOSIS — I10 Essential (primary) hypertension: Secondary | ICD-10-CM | POA: Diagnosis not present

## 2016-11-16 DIAGNOSIS — E876 Hypokalemia: Secondary | ICD-10-CM

## 2016-11-16 DIAGNOSIS — R748 Abnormal levels of other serum enzymes: Secondary | ICD-10-CM

## 2016-11-16 DIAGNOSIS — G9341 Metabolic encephalopathy: Secondary | ICD-10-CM | POA: Diagnosis not present

## 2016-11-16 DIAGNOSIS — R7989 Other specified abnormal findings of blood chemistry: Secondary | ICD-10-CM

## 2016-11-16 DIAGNOSIS — R778 Other specified abnormalities of plasma proteins: Secondary | ICD-10-CM | POA: Diagnosis present

## 2016-11-16 LAB — BASIC METABOLIC PANEL
Anion gap: 6 (ref 5–15)
BUN: 8 mg/dL (ref 6–20)
CALCIUM: 8.6 mg/dL — AB (ref 8.9–10.3)
CO2: 24 mmol/L (ref 22–32)
CREATININE: 1.02 mg/dL (ref 0.61–1.24)
Chloride: 110 mmol/L (ref 101–111)
GFR calc Af Amer: >60 mL/min (ref >60)
GLUCOSE: 96 mg/dL (ref 65–99)
Potassium: 3.3 mmol/L — ABNORMAL LOW (ref 3.5–5.1)
Sodium: 140 mmol/L (ref 135–145)

## 2016-11-16 LAB — TROPONIN I
TROPONIN I: 0.03 ng/mL — AB (ref <0.03)
TROPONIN I: 0.04 ng/mL — AB (ref <0.03)

## 2016-11-16 LAB — MAGNESIUM: Magnesium: 2.1 mg/dL (ref 1.7–2.4)

## 2016-11-16 LAB — LIPID PANEL
CHOL/HDL RATIO: 3.3 ratio
Cholesterol: 66 mg/dL (ref 0–200)
HDL: 20 mg/dL — ABNORMAL LOW (ref >40)
LDL CALC: 28 mg/dL (ref 0–99)
Triglycerides: 88 mg/dL (ref <150)
VLDL: 18 mg/dL (ref 0–40)

## 2016-11-16 MED ORDER — NITROGLYCERIN 0.4 MG SL SUBL: 0.4000 mg | SUBLINGUAL_TABLET | SUBLINGUAL | Status: DC | PRN

## 2016-11-16 MED ORDER — POTASSIUM CHLORIDE CRYS ER 20 MEQ PO TBCR
40.0000 meq | EXTENDED_RELEASE_TABLET | Freq: Once | ORAL | Status: AC
  Administered 2016-11-16: 40 meq via ORAL
  Filled 2016-11-16: qty 2

## 2016-11-16 NOTE — Progress Notes (Signed)
EEG Completed; Results Pending  

## 2016-11-16 NOTE — Procedures (Signed)
ELECTROENCEPHALOGRAM REPORT  Date of Study: 11/16/2016  Patient's Name: Joel PearsonDaniel Solis MRN: [id number] Date of Birth: [birth year]  Referring Provider: Dr. Osvaldo ShipperGokul Krishnan  Clinical History: This is a 71 year old man with altered mental status.  Medications: acetaminophen (TYLENOL) tablet 650 mg  amLODipine (NORVASC) tablet 10 mg  aspirin EC tablet 81 mg  atorvastatin (LIPITOR) tablet 40 mg  enoxaparin (LOVENOX) injection 40 mg  furosemide (LASIX) tablet 20 mg  hydrALAZINE (APRESOLINE) injection 5 mg  lactulose (CHRONULAC) 10 GM/15ML solution 20 g  lisinopril (PRINIVIL,ZESTRIL) tablet 20 mg  metoprolol tartrate (LOPRESSOR) tablet 100 mg  nicotine (NICODERM CQ - dosed in mg/24 hours) patch 21 mg  nitroGLYCERIN (NITROSTAT) SL tablet 0.4 mg  ondansetron (ZOFRAN) injection 4 mg  potassium chloride (K-DUR,KLOR-CON) CR tablet 10 mEq  senna-docusate (Senokot-S) tablet 1 tablet  zolpidem (AMBIEN) tablet 5 mg   Technical Summary: A multichannel digital EEG recording measured by the international 10-20 system with electrodes applied with paste and impedances below 5000 ohms performed as portable with EKG monitoring in an awake and asleep patient.  Hyperventilation and photic stimulation were not performed.  The digital EEG was referentially recorded, reformatted, and digitally filtered in a variety of bipolar and referential montages for optimal display.   Description: The patient is awake and asleep during the recording. He is noted to be confused and agitated. During maximal wakefulness, there is a symmetric, medium voltage 8 Hz posterior dominant rhythm that poorly attenuates with eye opening and eye closure. This is admixed with a small amount of diffuse 4-5 Hz theta slowing and occasional 2-3 Hz delta slowing of the waking background. There is independent occasional 2-3 Hz focal slowing seen over the bilateral temporal regions.  During drowsiness and sleep, there is an increase in theta and  delta slowing of the background, with poorly formed vertex waves seen.  Hyperventilation and photic stimulation were not performed.  There were no epileptiform discharges or electrographic seizures seen.    EKG lead showed sinus bradycardia.  Impression: This awake and asleep EEG is abnormal due the presence of: 1. Mild to moderate diffuse slowing of the background 2. Occasional focal slowing over the bilateral temporal regions  Clinical Correlation of the above findings indicates diffuse cerebral dysfunction that is non-specific in etiology and can be seen with hypoxic/ischemic injury, toxic/metabolic encephalopathies, neurodegenerative disorders, or medication effect. Focal slowing over the bilateral temporal regions indicates focal cerebral dysfunction in these regions suggestive of underlying structural or physiologic abnormality. The absence of epileptiform discharges does not rule out a clinical diagnosis of epilepsy.  Clinical correlation is advised.   Patrcia DollyKaren Latara Micheli, M.D.

## 2016-11-16 NOTE — Evaluation (Addendum)
Physical Therapy Evaluation Patient Details Name: Joel PearsonDaniel Schoenbeck MRN: [id number] DOB: [birth year] Today's Date: 11/16/2016   History of Present Illness  Pt admitted with confusion and hallucinations, found by his home health therapist. CT negative for acute abnormality. Pt reports he has had these type hallucinations before. PMH: hemorhagic stroke with L side weakness and mild slurred speech, VP shunt, HTN, HLD.  Clinical Impression  Orders received for PT evaluation. Patient demonstrates deficits in functional mobility as indicated below. Will benefit from continued skilled PT to address deficits and maximize function. Will see as indicated and progress as tolerated.  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 Patient has formulated modifications to mobility in his home which include use of a power chair and transfer pole in multiple rooms. Patient was set up for HHPT prior to admission, recommend resuming HHPT and adding HH Aide to assist upon discharge. Will follow acutely.    Follow Up Recommendations Home health PT;Supervision - Intermittent (resume HHPT; recommend HH AIDE)    Equipment Recommendations       Recommendations for Other Services       Precautions / Restrictions Precautions Precautions: Fall Restrictions Weight Bearing Restrictions: No      Mobility  Bed Mobility Overal bed mobility: Modified Independent             General bed mobility comments: used rail and momentum, no physical assist   Transfers Overall transfer level: Needs assistance Equipment used:  (Utilized IV pole with +2 to stabilize pole) Transfers: Sit to/from RaytheonStand;Stand Pivot Transfers Sit to Stand: Min assist Stand pivot transfers: Min assist       General transfer comment: used stabilized IV pole to simulate transfer he performs at home with transfer pole, pt with flexion of L LE and minimal ability to weight bear  Ambulation/Gait              General Gait Details: non ambulatory  Stairs            Wheelchair Mobility    Modified Rankin (Stroke Patients Only) Modified Rankin (Stroke Patients Only) Pre-Morbid Rankin Score: Severe disability Modified Rankin: Severe disability     Balance Overall balance assessment: Needs assistance   Sitting balance-Leahy Scale: Fair       Standing balance-Leahy Scale: Poor                               Pertinent Vitals/Pain Pain Assessment: Faces Faces Pain Scale: Hurts little more Pain Location: buttocks Pain Descriptors / Indicators: Sore Pain Intervention(s): Monitored during session    Home Living Family/patient expects to be discharged to:: Private residence Living Arrangements: Alone Available Help at Discharge: Family;Available PRN/intermittently (daughter checks on daily) Type of Home: Apartment Home Access: Level entry     Home Layout: One level Home Equipment: Wheelchair - power;Tub bench;Hand held shower head;Other (comment);Adaptive equipment (transfer pole)      Prior Function Level of Independence: Independent with assistive device(s)         Comments: uses transfer pole independently, reports daughter helping with IADL     Hand Dominance   Dominant Hand: Right    Extremity/Trunk Assessment   Upper Extremity Assessment Upper Extremity Assessment: LUE deficits/detail LUE Deficits / Details: longstanding deficits from prior CVA LUE Coordination: decreased fine motor;decreased gross motor    Lower Extremity Assessment Lower Extremity Assessment: LLE deficits/detail LLE Deficits / Details: severe LLE limited  ROM due to spastic hemiplegia, flexion and adduction of left leg in standing with inability to place LLE on ground LLE Sensation: decreased light touch;decreased proprioception LLE Coordination: decreased fine motor;decreased gross motor       Communication   Communication: Expressive difficulties (slightly  slurred)  Cognition Arousal/Alertness: Awake/alert Behavior During Therapy: WFL for tasks assessed/performed Overall Cognitive Status: Impaired/Different from baseline Area of Impairment: Safety/judgement                         Safety/Judgement: Decreased awareness of safety     General Comments: pt in a pool of urine, did not call for help      General Comments      Exercises     Assessment/Plan    PT Assessment Patient needs continued PT services  PT Problem List Decreased strength;Decreased activity tolerance;Decreased balance;Decreased mobility;Decreased cognition;Impaired tone       PT Treatment Interventions DME instruction;Functional mobility training;Therapeutic activities;Therapeutic exercise;Balance training;Neuromuscular re-education;Patient/family education;Modalities    PT Goals (Current goals can be found in the Care Plan section)  Acute Rehab PT Goals Patient Stated Goal: to return home PT Goal Formulation: With patient Time For Goal Achievement: 11/30/16 Potential to Achieve Goals: Fair    Frequency Min 3X/week   Barriers to discharge        Co-evaluation               AM-PAC PT "6 Clicks" Daily Activity  Outcome Measure Difficulty turning over in bed (including adjusting bedclothes, sheets and blankets)?: A Little Difficulty moving from lying on back to sitting on the side of the bed? : A Little Difficulty sitting down on and standing up from a chair with arms (e.g., wheelchair, bedside commode, etc,.)?: A Little Help needed moving to and from a bed to chair (including a wheelchair)?: A Little Help needed walking in hospital room?: Total Help needed climbing 3-5 steps with a railing? : Total 6 Click Score: 14    End of Session   Activity Tolerance: Patient limited by fatigue Patient left: in chair;with call bell/phone within reach;with chair alarm set Nurse Communication: Mobility status PT Visit Diagnosis: Other  abnormalities of gait and mobility (R26.89)    Time: 1610-9604 PT Time Calculation (min) (ACUTE ONLY): 33 min   Charges:   PT Evaluation $PT Eval Moderate Complexity: 1 Procedure     PT G Codes:   PT G-Codes **NOT FOR INPATIENT CLASS** Functional Assessment Tool Used: Clinical judgement Functional Limitation: Mobility: Walking and moving around Mobility: Walking and Moving Around Current Status (V4098): At least 20 percent but less than 40 percent impaired, limited or restricted Mobility: Walking and Moving Around Goal Status [phone]): At least 1 percent but less than 20 percent impaired, limited or restricted    Charlotte Crumb, PT DPT [facility] [phone]   Fabio Asa 11/16/2016, 4:02 PM

## 2016-11-16 NOTE — Care Management Obs Status (Signed)
MEDICARE OBSERVATION STATUS NOTIFICATION   Patient Details  Name: Joel PearsonDaniel Brauer MRN: [id number] Date of Birth: January 05, 1946   Medicare Observation Status Notification Given:  Yes    Epifanio LeschesCole, Ijanae Macapagal Hudson, RN 11/16/2016, 4:23 PM

## 2016-11-16 NOTE — Evaluation (Signed)
Occupational Therapy Evaluation Patient Details Name: Joel PearsonDaniel Solis MRN: [id number] DOB: December 23, 1945 Today's Date: 11/16/2016    History of Present Illness Pt admitted with confusion and hallucinations, found by his home health therapist. CT negative for acute abnormality. Pt reports he has had these type hallucinations before. PMH: hemorhagic stroke with L side weakness and mild slurred speech, VP shunt, HTN, HLD.   Clinical Impression   Pt was modified independent using a transfer pole and motorized w/c as well as DME and AE for mobility and ADL. His daughter checks on his daily since [place] from [place] 3 [place] ago. Pt displays dense L hemiplegia, impaired standing balance and decreased safety awareness. Pt likely to perform much better in his home set up. Will follow acutely. Recommending pt resume HHOT.   Follow Up Recommendations  Home health OT    Equipment Recommendations  None recommended by OT    Recommendations for Other Services       Precautions / Restrictions Precautions Precautions: Fall Restrictions Weight Bearing Restrictions: No      Mobility Bed Mobility Overal bed mobility: Modified Independent             General bed mobility comments: used rail and momentum, no physical assist   Transfers Overall transfer level: Needs assistance   Transfers: Sit to/from Stand;Stand Pivot Transfers Sit to Stand: Min assist Stand pivot transfers: Min assist       General transfer comment: used stabilized IV pole to simulate transfer he performs at home with transfer pole, pt with flexion of L LE and minimal ability to weight bear    Balance Overall balance assessment: Needs assistance   Sitting balance-Leahy Scale: Fair       Standing balance-Leahy Scale: Poor                             ADL either performed or assessed with clinical judgement   ADL Overall ADL's : Needs assistance/impaired Eating/Feeding:  Independent;Sitting Eating/Feeding Details (indicate cue type and reason): increased spillage Grooming: Wash/dry face;Set up;Bed level   Upper Body Bathing: Minimal assistance;Sitting   Lower Body Bathing: Maximal assistance;Sit to/from stand   Upper Body Dressing : Moderate assistance;Sitting   Lower Body Dressing: Set up;Sitting/lateral leans Lower Body Dressing Details (indicate cue type and reason): slip on shoes, pt does not wear socks Toilet Transfer: Minimal assistance;Stand-pivot (used IV pole as transfer pole)   Toileting- Clothing Manipulation and Hygiene: Maximal assistance;Sit to/from stand       Functional mobility during ADLs: Minimal assistance (using stabilized IV pole) General ADL Comments: Pt likely at his baseline, may function better in his own environment.     Vision Baseline Vision/History: Wears glasses Wears Glasses: Reading only Patient Visual Report: No change from baseline       Perception     Praxis      Pertinent Vitals/Pain Pain Assessment: Faces Faces Pain Scale: Hurts little more Pain Location: buttocks Pain Descriptors / Indicators: Sore Pain Intervention(s): Monitored during session     Hand Dominance Right   Extremity/Trunk Assessment Upper Extremity Assessment Upper Extremity Assessment: LUE deficits/detail LUE Deficits / Details: longstanding deficits from prior CVA LUE Coordination: decreased fine motor;decreased gross motor   Lower Extremity Assessment Lower Extremity Assessment: Defer to PT evaluation       Communication Communication Communication: Expressive difficulties (slightly slurred)   Cognition Arousal/Alertness: Awake/alert Behavior During Therapy: WFL for tasks assessed/performed Overall Cognitive Status: Impaired/Different from  baseline Area of Impairment: Safety/judgement                         Safety/Judgement: Decreased awareness of safety     General Comments: pt in a pool of urine, did  not call for help   General Comments       Exercises     Shoulder Instructions      Home Living Family/patient expects to be discharged to:: Private residence Living Arrangements: Alone Available Help at Discharge: Family;Available PRN/intermittently (daughter checks on daily) Type of Home: Apartment Home Access: Level entry     Home Layout: One level     Bathroom Shower/Tub: Chief Strategy Officer: Standard     Home Equipment: Wheelchair - power;Tub bench;Hand held shower head;Other (comment);Adaptive equipment (transfer pole) Adaptive Equipment: Reacher        Prior Functioning/Environment Level of Independence: Independent with assistive device(s)        Comments: uses transfer pole independently, reports daughter helping with IADL        OT Problem List: Decreased strength;Decreased activity tolerance;Impaired balance (sitting and/or standing);Decreased coordination;Decreased safety awareness;Pain      OT Treatment/Interventions: Self-care/ADL training;DME and/or AE instruction;Therapeutic activities;Patient/family education    OT Goals(Current goals can be found in the care plan section) Acute Rehab OT Goals Patient Stated Goal: to return home OT Goal Formulation: With patient Time For Goal Achievement: 11/30/16 Potential to Achieve Goals: Good ADL Goals Pt Will Perform Grooming: with set-up;sitting Pt Will Perform Upper Body Bathing: with set-up;with adaptive equipment;sitting Pt Will Perform Lower Body Bathing: with set-up;sitting/lateral leans Pt Will Perform Upper Body Dressing: with set-up;sitting Pt Will Perform Lower Body Dressing: with set-up;with adaptive equipment;sitting/lateral leans Pt Will Transfer to Toilet: with set-up;stand pivot transfer;bedside commode Pt Will Perform Tub/Shower Transfer: Tub transfer;ambulating;tub bench;with supervision  OT Frequency: Min 2X/week   Barriers to D/C: Decreased caregiver support           Co-evaluation              AM-PAC PT "6 Clicks" Daily Activity     Outcome Measure Help from another person eating meals?: None Help from another person taking care of personal grooming?: A Little Help from another person toileting, which includes using toliet, bedpan, or urinal?: A Lot Help from another person bathing (including washing, rinsing, drying)?: A Lot Help from another person to put on and taking off regular upper body clothing?: A Lot Help from another person to put on and taking off regular lower body clothing?: A Lot 6 Click Score: 15   End of Session Equipment Utilized During Treatment: Gait belt Nurse Communication: Mobility status  Activity Tolerance: Patient tolerated treatment well Patient left: in chair;with call bell/phone within reach;with chair alarm set  OT Visit Diagnosis: Unsteadiness on feet (R26.81);Muscle weakness (generalized) (M62.81);Hemiplegia and hemiparesis Hemiplegia - Right/Left: Left Hemiplegia - dominant/non-dominant: Non-Dominant Hemiplegia - caused by: Nontraumatic intracerebral hemorrhage                Time: 1342-1415 OT Time Calculation (min): 33 min Charges:  OT General Charges $OT Visit: 1 Procedure OT Evaluation $OT Eval Moderate Complexity: 1 Procedure G-Codes: OT G-codes **NOT FOR INPATIENT CLASS** Functional Assessment Tool Used: Clinical judgement Functional Limitation: Self care Self Care Current Status (G2952): At least 40 percent but less than 60 percent impaired, limited or restricted Self Care Goal Status (W4132): At least 1 percent but less than 20 percent impaired, limited or restricted  Evern Bio 11/16/2016, 3:43 PM  [phone]

## 2016-11-16 NOTE — Care Management Note (Addendum)
Case Management Note  Patient Details  Name: Joel Solis MRN: [id number] Date of Birth: 02-Dec-1945  Subjective/Objective:       Presents with confusion and hallucination    hx of hemorrhagic stroke with left-sided weakness and a mild slurred speech, s/p of VP shunt, hypertension, hyperlipidemia. PTA active with [facility] for home health services. Pt lives alone. Has support family. Daughter Joel Solis states will not be able to provide 24/7 supervision for dad if needed @ d/c 2/2 working and going to school.   [facility] Dar[facility]  Joel Solis (Daughter) Joel SportsmanScott Solis     [phone]      Action/Plan: Home Health vs SNF PT and OT evaluations pending...Marland Kitchen.CM following for disposition needs.    Expected Discharge Date:  11/17/16               Expected Discharge Plan:  Home w Home Health Services  In-House Referral:  Clinical Social Work  Discharge planning Services  CM Consult  Post Acute Care Choice:  Resumption of Svcs/PTA Provider Choice offered to:  Patient  DME Arranged:    DME Agency:     HH Arranged:  PT, RN, Social Work [facility]   [facility], active client P[facility]. CM called and left voice message regarding current  Hospital visit  and potential need to continue home health services with Kenard GowerDrew [phone].  Status of Service:  In process, will continue to follow  If discussed at Long Length of Stay Meetings, dates discussed:    Additional Comments:  Epifanio LeschesCole, [name], RN 11/16/2016, 11:13 AM

## 2016-11-16 NOTE — Progress Notes (Addendum)
Patient refusing to have q2 hour neuro checks and vital signs, but is willing to have q4 hour neuro checks and vital signs. Will complete neuro checks as requested by patient. Will continue to monitor and treat per MD orders.

## 2016-11-16 NOTE — Progress Notes (Signed)
[facility] PROGRESS NOTE  Brinley Treanor ZOX:[id number] DOB: Dec 01, 1945 DOA: 11/15/2016  PCP: [facility]  Brief History/Interval Summary: 71 year old Caucasian male with a past medical history of previous stroke with left-sided weakness, history of VP shunt, history of hypertension, hyperlipidemia presented with confusion and hallucinations. Apparently he has been confused the last 3 days prior to hospitalization. He has been seen bugs all over the floor of his house. Patient was hospitalized for further management.  Reason for Visit: Acute encephalopathy  Consultants: None  Procedures:  EEG Impression: This awake and asleep EEG is abnormal due the presence of: 1. Mild to moderate diffuse slowing of the background 2. Occasional focal slowing over the bilateral temporal regions  Clinical Correlation of the above findings indicates diffuse cerebral dysfunction that is non-specific in etiology and can be seen with hypoxic/ischemic injury, toxic/metabolic encephalopathies, neurodegenerative disorders, or medication effect. Focal slowing over the bilateral temporal regions indicates focal cerebral dysfunction in these regions suggestive of underlying structural or physiologic abnormality. The absence of epileptiform discharges does not rule out a clinical diagnosis of epilepsy.  Clinical correlation is advised.  Antibiotics: None  Subjective/Interval History: Patient feels well this morning. He tells that he does get visual hallucinations once in a while. None currently. Denies any headaches currently. No visual impairment. No chest pain or shortness of breath  ROS: Denies any nausea or vomiting  Objective:  Vital Signs  Vitals:   11/15/16 2144 11/15/16 2226 11/16/16 0001 11/16/16 0416  BP: 138/78 (!) 150/72 (!) 150/80 (!) 148/75  Pulse: 67 61 67 61  Resp: 12 18  18   Temp:  97.8 F (36.6 C)  97.6 F (36.4 C)  TempSrc:  Oral  Oral  SpO2:  100% 99%  100%  Weight:  90.9 kg (200 lb 8 oz)    Height:  5\' 8"  (1.727 m)      Intake/Output Summary (Last 24 hours) at 11/16/16 1428 Last data filed at 11/16/16 0654  Gross per 24 hour  Intake           711.25 ml  Output              875 ml  Net          -163.75 ml   Filed Weights   11/15/16 2226  Weight: 90.9 kg (200 lb 8 oz)    General appearance: alert, cooperative, appears stated age and no distress Resp: clear to auscultation bilaterally Cardio: regular rate and rhythm, S1, S2 normal, no murmur, click, rub or gallop GI: soft, non-tender; bowel sounds normal; no masses,  no organomegaly Extremities: extremities normal, atraumatic, no cyanosis or edema Neurologic: Left-sided hemiparesis is noted from previous stroke.  Lab Results:  Data Reviewed: I have personally reviewed following labs and imaging studies  CBC:  Recent Labs Lab 11/15/16 1400  WBC 9.4  NEUTROABS 5.3  HGB 16.4  HCT 49.6  MCV 85.8  PLT 268    Basic Metabolic Panel:  Recent Labs Lab 11/15/16 1400 11/16/16 0249  NA 142  --   K 2.9*  --   CL 108  --   CO2 25  --   GLUCOSE 95  --   BUN 12  --   CREATININE 1.13  --   CALCIUM 8.7*  --   MG  --  2.1    GFR: Estimated Creatinine Clearance: 65.6 mL/min (by C-G formula based on SCr of 1.13 mg/dL).  Liver Function Tests:  Recent Labs Lab 11/15/16  1400  AST 14*  ALT 12*  ALKPHOS 142*  BILITOT 0.8  PROT 6.4*  ALBUMIN 3.5     Recent Labs Lab 11/15/16 1601  AMMONIA 90*    Coagulation Profile:  Recent Labs Lab 11/15/16 2045  INR 1.12    Cardiac Enzymes:  Recent Labs Lab 11/15/16 2052 11/16/16 0249 11/16/16 0824  TROPONINI 0.03* 0.03* 0.04*     Lipid Profile:  Recent Labs  11/16/16 0249  CHOL 66  HDL 20*  LDLCALC 28  TRIG 88  CHOLHDL 3.3     Radiology Studies: Dg Chest 2 View  Result Date: 11/15/2016 CLINICAL DATA:  Mental status change which is unusual for the patient. EXAM: CHEST  2 VIEW  COMPARISON:  None in PACs FINDINGS: The lungs are adequately inflated and clear. The heart is top-normal in size. The pulmonary vascularity is normal. The mediastinum is normal in width. There is no pleural effusion. There is calcification in the wall of the aortic arch. The bony thorax is unremarkable. IMPRESSION: Top-normal cardiac size without evidence of pulmonary edema. Thoracic aortic atherosclerosis. Electronically Signed   By: David  SwazilandJordan M.D.   On: 11/15/2016 14:25   Ct Head Wo Contrast  Result Date: 11/15/2016 CLINICAL DATA:  Altered mental status, some left-sided weakness since prior stroke EXAM: CT HEAD WITHOUT CONTRAST TECHNIQUE: Contiguous axial images were obtained from the base of the skull through the vertex without intravenous contrast. COMPARISON:  None. FINDINGS: Brain: The ventricular system his min prominent as are the cortical sulci indicating diffuse atrophy. Ventriculostomy catheter enters from the right frontal region with the tip in the posterior aspect of the right lateral ventricle. The septum is midline in position. Moderate small vessel ischemic changes noted throughout the periventricular white matter. Vascular: No vascular abnormality is noted on this unenhanced study. Skull: On bone window images, no calvarial abnormality is seen. Sinuses/Orbits: Paranasal sinuses appear clear. Other: None. IMPRESSION: Atrophy and moderate small vessel ischemic change. VP shunt present. No acute intracranial abnormality. Electronically Signed   By: Dwyane DeePaul  Barry M.D.   On: 11/15/2016 14:55     Medications:  Scheduled: . amLODipine  10 mg Oral Daily  . aspirin EC  81 mg Oral Daily  . atorvastatin  40 mg Oral q1800  . enoxaparin (LOVENOX) injection  40 mg Subcutaneous Q24H  . furosemide  20 mg Oral Daily  . lactulose  20 g Oral TID  . lisinopril  20 mg Oral Daily  . metoprolol tartrate  100 mg Oral BID  . nicotine  21 mg Transdermal Daily  . potassium chloride  10 mEq Oral Daily    Continuous: . sodium chloride 75 mL/hr at 11/15/16 2353   WJX:BJYNWGNFAOZHYPRN:acetaminophen **OR** acetaminophen (TYLENOL) oral liquid 160 mg/5 mL **OR** acetaminophen, hydrALAZINE, nitroGLYCERIN, ondansetron, senna-docusate, zolpidem  Assessment/Plan:  Principal Problem:   Acute metabolic encephalopathy Active Problems:   Essential hypertension   HLD (hyperlipidemia)   Hemorrhagic stroke (HCC)   S/P VP shunt   Tobacco abuse   Hypokalemia   Elevated troponin    Acute encephalopathy, likely metabolic Etiology is unclear. CT head did not show acute findings. MRI brain is pending. Ammonia level was elevated at 90, but it isn't normal. No history of liver disease. Patient does have a VP shunt, but does not preclude him from getting MRI. Patient was started on lactulose. Mental status appears to have improved. EEG was done and did not show any epileptiform activity. PT and OT to evaluate.  Visual hallucinations. Patient tells  me that he does get these on and off. No abnormalities on EEG. Await MRI. Could be related to his previous stroke.  Essential hypertension. Continue home medications. Monitor closely.  Hypokalemia. This was repleted. Repeat labs pending from this morning.  Mildly elevated troponin. Clinically insignificant. Denies any chest pain. EKG did show nonspecific changes but no acute ischemic changes.  Previous history of stroke. Patient has left-sided weakness and slurred speech as a result. Continue aspirin.  Tobacco abuse. Nicotine patch  DVT Prophylaxis: Lovenox    Code Status: Full code  Family Communication: No family at bedside  Disposition Plan: Management as outlined above. Await MRI Brain.    LOS: 0 days   [facility]  [facility] Pager [phone] 11/16/2016, 2:28 PM  If 7PM-7AM, please contact night-coverage at [email or web address], password [facility]

## 2016-11-16 NOTE — Progress Notes (Signed)
NURSING PROGRESS NOTE  Joel BoomDaniel PowersMRN: [id number] Admission Data: 11/15/16 at 2245 Attending Provider: Lorretta HarpNiu, Xilin, MD PCP: [facility] Code status: Full  Allergies:  Allergies  Allergen Reactions  . Shellfish Allergy Nausea And Vomiting   Past Medical History:  Past Medical History:  Diagnosis Date  . Essential hypertension   . Hemorrhagic stroke (HCC)   . HLD (hyperlipidemia)   . S/P VP shunt   . Tobacco abuse    Past Surgical History:  Past Surgical History:  Procedure Laterality Date  . cyst      cyst in neck  . VENTRICULOPERITONEAL SHUNT     [name] is a 71 y.o. male patient, arrived to floor in room [phone] via stretcher, transferred from ED. Patient alert and oriented X 4. No acute distress noted. Denies pain.   Vital signs: Oral temperature 97.8 F (36.6 C), Blood pressure 150/72, Pulse 61, RR 18, SpO2 99 % on room air. Height 5'8, weight 200 lbs (90.9 kg).   Cardiac monitoring: Telemetry box 5W #20 in place. Second verified by Romero BellingMarie Trogden,RN  IV access: Right forearm and Right AC; condition patent and no redness.  Skin: intact, no pressure ulcer noted in sacral area. Abrasions noted on bilateral arms and legs. Sacrum red, but blanchable. Second verified by Romero BellingMarie Trogden,RN.   Patient's ID armband verified with patient and in place. Information packet given to patient. Fall risk assessed, SR up X2, patient able to verbalize understanding of risks associated with falls and to call nurse or staff to assist before getting out of bed. Patient oriented to room and equipment. Call bell within reach.

## 2016-11-17 ENCOUNTER — Other Ambulatory Visit (HOSPITAL_COMMUNITY): Payer: Medicare Other

## 2016-11-17 DIAGNOSIS — G9341 Metabolic encephalopathy: Secondary | ICD-10-CM | POA: Diagnosis not present

## 2016-11-17 LAB — CBC
HCT: 48.9 % (ref 39.0–52.0)
Hemoglobin: 15.7 g/dL (ref 13.0–17.0)
MCH: 27.5 pg (ref 26.0–34.0)
MCHC: 32.1 g/dL (ref 30.0–36.0)
MCV: 85.6 fL (ref 78.0–100.0)
PLATELETS: 253 10*3/uL (ref 150–400)
RBC: 5.71 MIL/uL (ref 4.22–5.81)
RDW: 13.8 % (ref 11.5–15.5)
WBC: 11.1 10*3/uL — ABNORMAL HIGH (ref 4.0–10.5)

## 2016-11-17 LAB — URINE CULTURE

## 2016-11-17 LAB — BASIC METABOLIC PANEL
Anion gap: 5 (ref 5–15)
BUN: 8 mg/dL (ref 6–20)
CO2: 22 mmol/L (ref 22–32)
Calcium: 8.5 mg/dL — ABNORMAL LOW (ref 8.9–10.3)
Chloride: 111 mmol/L (ref 101–111)
Creatinine, Ser: 1 mg/dL (ref 0.61–1.24)
GFR calc Af Amer: >60 mL/min (ref >60)
GLUCOSE: 94 mg/dL (ref 65–99)
POTASSIUM: 3.4 mmol/L — AB (ref 3.5–5.1)
Sodium: 138 mmol/L (ref 135–145)

## 2016-11-17 LAB — HEMOGLOBIN A1C
Hgb A1c MFr Bld: 5.4 % (ref 4.8–5.6)
Mean Plasma Glucose: 108 mg/dL

## 2016-11-17 LAB — AMMONIA: Ammonia: 35 umol/L (ref 9–35)

## 2016-11-17 MED ORDER — LACTULOSE 10 GM/15ML PO SOLN: 20.0000 g | Freq: Two times a day (BID) | ORAL | 0 refills | Status: DC

## 2016-11-17 NOTE — Progress Notes (Signed)
Physical Therapy Treatment Patient Details Name: Joel Solis MRN: [id number] DOB: Sep 11, 1945 Today's Date: 11/17/2016    History of Present Illness Pt admitted with confusion and hallucinations, found by his home health therapist. CT negative for acute abnormality. Pt reports he has had these type hallucinations before. PMH: hemorhagic stroke with L side weakness and mild slurred speech, VP shunt, HTN, HLD.    PT Comments    Worked on transfers to the R and pt didn't feel comfortable using IV pole today, so used a locked Stedy with it turned around backwards to simulate his transfer pole at home.  He went R to the recliner and then back to bed to the L.  He does better going to the R with increased tone in L LE causing more difficulty. Recommend HHPT and aide.   Follow Up Recommendations  Home health PT;Supervision - Intermittent;Other (comment) (resume HH, recommend HH aide)     Equipment Recommendations       Recommendations for Other Services       Precautions / Restrictions Precautions Precautions: Fall Restrictions Weight Bearing Restrictions: No    Mobility  Bed Mobility Overal bed mobility: Modified Independent             General bed mobility comments: used rail and momentum, no physical assist   Transfers Overall transfer level: Needs assistance   Transfers: Sit to/from Stand;Stand Pivot Transfers Sit to Stand: Min guard Stand pivot transfers: Min assist       General transfer comment: used stabilized and locked stedy bar (turned backward) for pt to pull up, practiced pivot x 2 toward R and L  Ambulation/Gait             General Gait Details: non ambulatory   Stairs            Wheelchair Mobility    Modified Rankin (Stroke Patients Only) Modified Rankin (Stroke Patients Only) Pre-Morbid Rankin Score: Severe disability Modified Rankin: Severe disability     Balance Overall balance assessment: Needs assistance   Sitting  balance-Leahy Scale: Fair       Standing balance-Leahy Scale: Poor                              Cognition Arousal/Alertness: Awake/alert Behavior During Therapy: WFL for tasks assessed/performed Overall Cognitive Status: Within Functional Limits for tasks assessed                                        Exercises      General Comments General comments (skin integrity, edema, etc.): Pt's gown wet with urine upon entry. Discussed safety at home and use of tranfer poles      Pertinent Vitals/Pain Pain Assessment: No/denies pain    Home Living                      Prior Function            PT Goals (current goals can now be found in the care plan section) Acute Rehab PT Goals Patient Stated Goal: to return home PT Goal Formulation: With patient Time For Goal Achievement: 11/30/16 Potential to Achieve Goals: Fair Progress towards PT goals: Progressing toward goals    Frequency    Min 3X/week      PT Plan Current plan remains appropriate    Co-evaluation  PT/OT/SLP Co-Evaluation/Treatment: Yes Reason for Co-Treatment: For patient/therapist safety PT goals addressed during session: Mobility/safety with mobility OT goals addressed during session: ADL's and self-care      AM-PAC PT "6 Clicks" Daily Activity  Outcome Measure  Difficulty turning over in bed (including adjusting bedclothes, sheets and blankets)?: A Little Difficulty moving from lying on back to sitting on the side of the bed? : A Little Difficulty sitting down on and standing up from a chair with arms (e.g., wheelchair, bedside commode, etc,.)?: A Little Help needed moving to and from a bed to chair (including a wheelchair)?: A Little Help needed walking in hospital room?: Total Help needed climbing 3-5 steps with a railing? : Total 6 Click Score: 14    End of Session   Activity Tolerance: Patient tolerated treatment well Patient left: in bed;with call  bell/phone within reach Nurse Communication: Mobility status PT Visit Diagnosis: Other abnormalities of gait and mobility (R26.89)     Time: 0935-1006 PT Time Calculation (min) (ACUTE ONLY): 31 min  Charges:  $Therapeutic Activity: 8-22 mins                    G Codes:       Joel Solis, [place]PT Pager [phone] 11/17/2016    Joel Solis 11/17/2016, 10:40 AM

## 2016-11-17 NOTE — Progress Notes (Signed)
Occupational Therapy Treatment Patient Details Name: Navy Belay MRN: [id number] DOB: [birth year] Today's Date: 11/17/2016    History of present illness Pt admitted with confusion and hallucinations, found by his home health therapist. CT negative for acute abnormality. Pt reports he has had these type hallucinations before. PMH: hemorhagic stroke with L side weakness and mild slurred speech, VP shunt, HTN, HLD.   OT comments  Pt demonstrating how he would manage LB dressing and washing periarea when sponge bathing at bed level. Pt with urine on gown and again did not call for assistance. Pt practiced stand pivot transfer, simulating use of grab bar as he does at home to transfer to the toilet. Pt is eager to go home.  Follow Up Recommendations  Home health OT    Equipment Recommendations  None recommended by OT    Recommendations for Other Services      Precautions / Restrictions Precautions Precautions: Fall Restrictions Weight Bearing Restrictions: No       Mobility Bed Mobility Overal bed mobility: Modified Independent             General bed mobility comments: used rail and momentum, no physical assist   Transfers Overall transfer level: Needs assistance   Transfers: Sit to/from Stand;Stand Pivot Transfers Sit to Stand: Min guard Stand pivot transfers: Min assist       General transfer comment: used stabilized and locked stedy bar (turned backward) for pt to pull up, practiced pivot x 2 toward R and L    Balance Overall balance assessment: Needs assistance   Sitting balance-Leahy Scale: Fair       Standing balance-Leahy Scale: Poor                             ADL either performed or assessed with clinical judgement   ADL Overall ADL's : Needs assistance/impaired     Grooming: Brushing hair;Sitting;Set up           Upper Body Dressing : Moderate assistance;Sitting Upper Body Dressing Details (indicate cue type and reason): cues  for hemitechnique Lower Body Dressing: Set up;Sitting/lateral leans Lower Body Dressing Details (indicate cue type and reason): slip on shoes, pt does not wear socks               General ADL Comments: practiced transfers using locked stedy to pull up, attempting to simulate his grab bars at home     Vision       Perception     Praxis      Cognition Arousal/Alertness: Awake/alert Behavior During Therapy: WFL for tasks assessed/performed Overall Cognitive Status: Within Functional Limits for tasks assessed                                          Exercises     Shoulder Instructions       General Comments      Pertinent Vitals/ Pain       Pain Assessment: No/denies pain  Home Living                                          Prior Functioning/Environment              Frequency  Min 2X/week  Progress Toward Goals  OT Goals(current goals can now be found in the care plan section)  Progress towards OT goals: Progressing toward goals  Acute Rehab OT Goals Patient Stated Goal: to return home OT Goal Formulation: With patient Time For Goal Achievement: 11/30/16 Potential to Achieve Goals: Good  Plan Discharge plan remains appropriate    Co-evaluation    PT/OT/SLP Co-Evaluation/Treatment: Yes Reason for Co-Treatment: For patient/therapist safety   OT goals addressed during session: ADL's and self-care      AM-PAC PT "6 Clicks" Daily Activity     Outcome Measure   Help from another person eating meals?: None Help from another person taking care of personal grooming?: A Little Help from another person toileting, which includes using toliet, bedpan, or urinal?: A Little Help from another person bathing (including washing, rinsing, drying)?: A Little Help from another person to put on and taking off regular upper body clothing?: A Little Help from another person to put on and taking off regular lower body  clothing?: A Little 6 Click Score: 19    End of Session    OT Visit Diagnosis: Unsteadiness on feet (R26.81);Muscle weakness (generalized) (M62.81);Hemiplegia and hemiparesis Hemiplegia - Right/Left: Left Hemiplegia - dominant/non-dominant: Non-Dominant Hemiplegia - caused by: Nontraumatic intracerebral hemorrhage   Activity Tolerance Patient tolerated treatment well   Patient Left in bed;with call bell/phone within reach   Nurse Communication          Time: [id number] OT Time Calculation (min): 32 min  Charges: OT General Charges $OT Visit: 1 Procedure OT Treatments $Self Care/Home Management : 8-22 mins   Evern BioMayberry, Shenelle Klas Lynn 11/17/2016, 10:21 AM  [phone]

## 2016-11-17 NOTE — Care Management Note (Signed)
Case Management Note  Patient Details  Name: Joel PearsonDaniel Solis MRN: [id number] Date of Birth: 07-06-1945  Subjective/Objective:                    Action/Plan: Plan is to d/c today with home health services. Daughter, Irving Burtonmily , is to provide transportation to home.  Expected Discharge Date:  11/17/16               Expected Discharge Plan:  Home w Home Health Services  In-House Referral:  Clinical Social Work  Discharge planning Services  CM Consult  Post Acute Care Choice:  Resumption of Svcs/PTA Provider Choice offered to:  Patient  DME Arranged:    DME Agency:     HH Arranged:  PT, RN, Social Work, OT [facility], CM made [facility] aware of pt's d/c plan, home today.  Status of Service:  Completed, signed off  If discussed at Long Length of Stay Meetings, dates discussed:    Additional Comments:  Epifanio LeschesCole, Ilian Wessell Hudson, RN 11/17/2016, 12:46 PM

## 2016-11-17 NOTE — Progress Notes (Signed)
Pt given discharge instructions, prescriptions, and care notes. Pt verbalized understanding AEB no further questions or concerns at this time. IV was discontinued, no redness, pain, or swelling noted at this time. Telemetry discontinued and Centralized Telemetry was notified. Pt to leave the floor via wheelchair with staff in stable condition. 

## 2016-11-17 NOTE — Discharge Summary (Signed)
[facility]  Physician Discharge Summary   Patient ID: Joel Solis MRN: [id number] DOB/AGE: 08-03-1945 71 y.o.  Admit date: 11/15/2016 Discharge date: 11/17/2016  PCP: [facility]  DISCHARGE DIAGNOSES:  Principal Problem:   Acute metabolic encephalopathy Active Problems:   Essential hypertension   HLD (hyperlipidemia)   Hemorrhagic stroke (HCC)   S/P VP shunt   Tobacco abuse   Hypokalemia   Elevated troponin   RECOMMENDATIONS FOR OUTPATIENT FOLLOW UP: 1. Home health has been ordered 2. Consider outpatient workup for liver disease   DISCHARGE CONDITION: fair  Diet recommendation: As before  [facility] Weights   11/15/16 2226  Weight: 90.9 kg (200 lb 8 oz)    INITIAL HISTORY: 71 year old Caucasian male with a past medical history of previous stroke with left-sided weakness, history of VP shunt, history of hypertension, hyperlipidemia presented with confusion and hallucinations. Apparently he has been confused the last 3 days prior to hospitalization. He has been seen bugs all over the floor of his house. Patient was hospitalized for further management.  Consultations:  None  Procedures: EEG Impression: This awake and asleepEEG is abnormal due the presence of: 1. Mild to moderatediffuse slowing of the background 2. Occasional focal slowing over the bilateral temporal regions  Clinical Correlation of the above findings indicates diffuse cerebral dysfunction that is non-specific in etiology and can be seen with hypoxic/ischemic injury, toxic/metabolic encephalopathies, neurodegenerative disorders, or medication effect. Focal slowing over the bilateral temporal regions indicates focal cerebral dysfunction in these regions suggestive of underlying structural or physiologic abnormality. The absence of epileptiform discharges does not rule out a clinical diagnosis of epilepsy. Clinical correlation is advised.   HOSPITAL COURSE:    Acute encephalopathy, likely metabolic Etiology remains unclear. The only positive finding was an elevated ammonia level at 90. Significance of this is unclear. The patient does not have any known history of liver disease. However, patient does admit to somewhat heavy use of alcohol in the past but not currently. His LFTs here have been normal. Further workup could be considered as an outpatient for liver disease. In the meantime, he will be discharged on lactulose. MRI brain did not show any acute abnormalities. EEG did not show any epileptiform activity. Patient is back to baseline. No further episodes of hallucinations. Seen by physical therapy. Home health has been ordered.   Visual hallucinations. Patient tells me that he does get these on and off. No abnormalities on EEG. No acute findings on MRI. Could be related to his previous stroke. Now resolved.  Essential hypertension. Continue medications  Hypokalemia. This was repleted.   Mildly elevated troponin. Clinically insignificant. Denies any chest pain. EKG did show nonspecific changes but no acute ischemic changes.  Previous history of stroke. Patient has left-sided weakness and slurred speech as a result. Continue aspirin.  Tobacco abuse. Nicotine patch  Overall, stable. Patient wishes to go home. Okay for discharge  PERTINENT LABS:  The results of significant diagnostics from this hospitalization (including imaging, microbiology, ancillary and laboratory) are listed below for reference.    Microbiology: Recent Results (from the past 240 hour(s))  Urine Culture     Status: Abnormal   Collection Time: 11/15/16  8:46 PM  Result Value Ref Range Status   Specimen Description URINE, CLEAN CATCH  Final   Special Requests NONE  Final   Culture MULTIPLE SPECIES PRESENT, SUGGEST RECOLLECTION (A)  Final   Report Status 11/17/2016 FINAL  Final     Labs: Basic Metabolic Panel:  Recent Labs Lab 11/15/16 1400  11/16/16 0249 11/16/16 1125 11/17/16 0622  NA 142  --  140 138  K 2.9*  --  3.3* 3.4*  CL 108  --  110 111  CO2 25  --  24 22  GLUCOSE 95  --  96 94  BUN 12  --  8 8  CREATININE 1.13  --  1.02 1.00  CALCIUM 8.7*  --  8.6* 8.5*  MG  --  2.1  --   --    Liver Function Tests:  Recent Labs Lab 11/15/16 1400  AST 14*  ALT 12*  ALKPHOS 142*  BILITOT 0.8  PROT 6.4*  ALBUMIN 3.5    Recent Labs Lab 11/15/16 1601 11/17/16 0622  AMMONIA 90* 35   CBC:  Recent Labs Lab 11/15/16 1400 11/17/16 0622  WBC 9.4 11.1*  NEUTROABS 5.3  --   HGB 16.4 15.7  HCT 49.6 48.9  MCV 85.8 85.6  PLT 268 253   Cardiac Enzymes:  Recent Labs Lab 11/15/16 2052 11/16/16 0249 11/16/16 0824  TROPONINI 0.03* 0.03* 0.04*     IMAGING STUDIES Dg Chest 2 View  Result Date: 11/15/2016 CLINICAL DATA:  Mental status change which is unusual for the patient. EXAM: CHEST  2 VIEW COMPARISON:  None in PACs FINDINGS: The lungs are adequately inflated and clear. The heart is top-normal in size. The pulmonary vascularity is normal. The mediastinum is normal in width. There is no pleural effusion. There is calcification in the wall of the aortic arch. The bony thorax is unremarkable. IMPRESSION: Top-normal cardiac size without evidence of pulmonary edema. Thoracic aortic atherosclerosis. Electronically Signed   By: David  SwazilandJordan M.D.   On: 11/15/2016 14:25   Ct Head Wo Contrast  Result Date: 11/15/2016 CLINICAL DATA:  Altered mental status, some left-sided weakness since prior stroke EXAM: CT HEAD WITHOUT CONTRAST TECHNIQUE: Contiguous axial images were obtained from the base of the skull through the vertex without intravenous contrast. COMPARISON:  None. FINDINGS: Brain: The ventricular system his min prominent as are the cortical sulci indicating diffuse atrophy. Ventriculostomy catheter enters from the right frontal region with the tip in the posterior aspect of the right lateral ventricle. The septum is  midline in position. Moderate small vessel ischemic changes noted throughout the periventricular white matter. Vascular: No vascular abnormality is noted on this unenhanced study. Skull: On bone window images, no calvarial abnormality is seen. Sinuses/Orbits: Paranasal sinuses appear clear. Other: None. IMPRESSION: Atrophy and moderate small vessel ischemic change. VP shunt present. No acute intracranial abnormality. Electronically Signed   By: Dwyane DeePaul  Barry M.D.   On: 11/15/2016 14:55   Mr Brain Wo Contrast  Result Date: 11/16/2016 CLINICAL DATA:  Initial evaluation for acute encephalopathy. EXAM: MRI HEAD WITHOUT CONTRAST TECHNIQUE: Multiplanar, multiecho pulse sequences of the brain and surrounding structures were obtained without intravenous contrast. COMPARISON:  Prior CT from 11/17/2016. FINDINGS: Brain: Diffuse prominence of the CSF containing spaces compatible with cerebral atrophy, mildly advanced for age. Patchy and confluent T2/FLAIR hyperintensity within the periventricular and deep white matter both cerebral hemispheres most consistent with chronic small vessel ischemic disease, moderate nature. Encephalomalacia within the right thalamus/ basal ganglia, extending towards the external capsule consistent with remote ischemic infarct. Associated chronic hemorrhagic blood products noted. Associated ex vacuo dilatation of the right lateral ventricle. Additional remote lacunar infarcts noted within the left thalamus. No abnormal foci of restricted diffusion to suggest acute or subacute ischemia. Gray-white matter differentiation otherwise maintained. No other evidence  for remote infarction. Right frontal approach ventricular catheter in place with tip terminating in the posterior right lateral ventricle. 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 No other mass lesion. No hydrocephalus. No midline shift. No extra-axial fluid collection. Major  dural sinuses are grossly patent. Pituitary gland suprasellar region within normal limits. Vascular: Abnormal flow void within the right ICA to the level of the terminus, which may be related to slow flow and/ or occlusion. Major intracranial vascular flow voids otherwise maintained. Skull and upper cervical spine: Craniocervical junction within normal limits. Visualized upper cervical spine unremarkable. Bone marrow signal intensity within normal limits. Septated artifact related to shunt catheter present at the right scalp. Scalp soft tissues demonstrate no acute abnormality. Sinuses/Orbits: Globes and orbital soft tissues within normal limits. Paranasal sinuses are clear. No mastoid effusion. Inner ear structures normal. IMPRESSION: 1. No acute intracranial infarct or other process identified. 2. Right frontal approach ventricular catheter in place as above. No hydrocephalus. 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 3. Remote hemorrhagic right thalamic/basal ganglia infarct, with additional remote left thalamic lacunar infarct. 4. Abnormal flow void within the right ICA to the level of the terminus, which may be related to slow flow and/or occlusion. 5. Generalized cerebral atrophy with moderate chronic small vessel ischemic disease. Electronically Signed   By: Rise Mu M.D.   On: 11/16/2016 20:30    DISCHARGE EXAMINATION: Vitals:   11/16/16 0416 11/16/16 1537 11/16/16 2141 11/17/16 0625  BP: (!) 148/75 (!) 151/78 (!) 147/76 (!) 149/76  Pulse: 61 66 68 70  Resp: 18 20 18 18   Temp: 97.6 F (36.4 C) 98.3 F (36.8 C) 98.2 F (36.8 C) 98.6 F (37 C)  TempSrc: Oral  Oral   SpO2: 100% 96% 100% 95%  Weight:      Height:       General appearance: alert, cooperative, appears stated age and no distress Resp: clear to auscultation bilaterally Cardio: regular rate and rhythm, S1, S2 normal, no murmur, click, rub or gallop GI: soft,  non-tender; bowel sounds normal; no masses,  no organomegaly Extremities: extremities normal, atraumatic, no cyanosis or edema  DISPOSITION: Home  Discharge Instructions    Call MD for:  difficulty breathing, headache or visual disturbances    Complete by:  As directed    Call MD for:  extreme fatigue    Complete by:  As directed    Call MD for:  persistant dizziness or light-headedness    Complete by:  As directed    Call MD for:  persistant nausea and vomiting    Complete by:  As directed    Call MD for:  temperature >100.4    Complete by:  As directed    Diet - low sodium heart healthy    Complete by:  As directed    Discharge instructions    Complete by:  As directed    Please have your PCP recheck your ammonia level at follow up. Your PCP should also consider evaluating you to see if you have any liver disease.   You were cared for by a hospitalist during your hospital stay. If you have any questions about your discharge medications or the care you received while you were in the hospital after you are discharged, you can call the unit and asked to speak with the hospitalist on call if the hospitalist that took care  of you is not available. Once you are discharged, your primary care physician will handle any further medical issues. Please note that NO REFILLS for any discharge medications will be authorized once you are discharged, as it is imperative that you return to your primary care physician (or establish a relationship with a primary care physician if you do not have one) for your aftercare needs so that they can reassess your need for medications and monitor your lab values. If you do not have a primary care physician, you can call [phone] for a physician referral.   Increase activity slowly    Complete by:  As directed       ALLERGIES:  Allergies  Allergen Reactions  . Shellfish Allergy Nausea And Vomiting     Discharge Medication List as of 11/17/2016 12:48 PM     START taking these medications   Details  lactulose (CHRONULAC) 10 GM/15ML solution Take 30 mLs (20 g total) by mouth 2 (two) times daily., Starting Thu 11/17/2016, Print      CONTINUE these medications which have NOT CHANGED   Details  amLODipine (NORVASC) 10 MG tablet Take 10 mg by mouth daily., Historical Med    aspirin EC 81 MG tablet Take 81 mg by mouth daily., Historical Med    atorvastatin (LIPITOR) 40 MG tablet Take 40 mg by mouth daily., Historical Med    baclofen (LIORESAL) 10 MG tablet Take 10 mg by mouth 3 (three) times daily., Historical Med    furosemide (LASIX) 20 MG tablet Take 20 mg by mouth., Historical Med    lisinopril (PRINIVIL,ZESTRIL) 20 MG tablet Take 20 mg by mouth daily., Historical Med    metoprolol tartrate (LOPRESSOR) 100 MG tablet Take 100 mg by mouth 2 (two) times daily., Historical Med    potassium chloride (K-DUR,KLOR-CON) 10 MEQ tablet Take 10 mEq by mouth daily., Historical Med         Follow-up Information    [facility]. Schedule an appointment as soon as possible for a visit in 1 week.   Specialty:  Family Medicine Why:  Called and left message they will call patient at home with an appointment. Contact information: [address] [place] [address] [phone]        [facility].   Specialty:  Home Health Services Why:  home health services arranged, office to call and set up home visits Contact information: [address] [place] [address] [phone]           TOTAL DISCHARGE TIME: 35 minutes  [facility]  [facility] Pager [phone]  11/17/2016, 4:43 PM

## 2016-11-22 ENCOUNTER — Encounter (HOSPITAL_COMMUNITY): Payer: Self-pay | Admitting: Emergency Medicine

## 2016-11-22 ENCOUNTER — Inpatient Hospital Stay (HOSPITAL_COMMUNITY)
Admission: EM | Admit: 2016-11-22 | Discharge: 2016-11-24 | DRG: 682 | Disposition: A | Discharge status: Home Health | Payer: Medicare Other | Attending: Internal Medicine | Admitting: Internal Medicine

## 2016-11-22 ENCOUNTER — Observation Stay (HOSPITAL_COMMUNITY): Payer: Medicare Other

## 2016-11-22 DIAGNOSIS — I10 Essential (primary) hypertension: Secondary | ICD-10-CM | POA: Diagnosis present

## 2016-11-22 DIAGNOSIS — Z982 Presence of cerebrospinal fluid drainage device: Secondary | ICD-10-CM

## 2016-11-22 DIAGNOSIS — N390 Urinary tract infection, site not specified: Secondary | ICD-10-CM | POA: Diagnosis not present

## 2016-11-22 DIAGNOSIS — T445X5A Adverse effect of predominantly beta-adrenoreceptor agonists, initial encounter: Secondary | ICD-10-CM | POA: Diagnosis present

## 2016-11-22 DIAGNOSIS — N179 Acute kidney failure, unspecified: Principal | ICD-10-CM | POA: Diagnosis present

## 2016-11-22 DIAGNOSIS — Z5919 Other inadequate housing: Secondary | ICD-10-CM

## 2016-11-22 DIAGNOSIS — Z591 Inadequate housing: Secondary | ICD-10-CM

## 2016-11-22 DIAGNOSIS — G9340 Encephalopathy, unspecified: Secondary | ICD-10-CM

## 2016-11-22 DIAGNOSIS — I69392 Facial weakness following cerebral infarction: Secondary | ICD-10-CM

## 2016-11-22 DIAGNOSIS — G9341 Metabolic encephalopathy: Secondary | ICD-10-CM | POA: Diagnosis present

## 2016-11-22 DIAGNOSIS — Z8673 Personal history of transient ischemic attack (TIA), and cerebral infarction without residual deficits: Secondary | ICD-10-CM

## 2016-11-22 DIAGNOSIS — R197 Diarrhea, unspecified: Secondary | ICD-10-CM | POA: Diagnosis present

## 2016-11-22 DIAGNOSIS — Z91013 Allergy to seafood: Secondary | ICD-10-CM

## 2016-11-22 DIAGNOSIS — Z91148 Patient's other noncompliance with medication regimen for other reason: Secondary | ICD-10-CM

## 2016-11-22 DIAGNOSIS — Z801 Family history of malignant neoplasm of trachea, bronchus and lung: Secondary | ICD-10-CM

## 2016-11-22 DIAGNOSIS — R410 Disorientation, unspecified: Secondary | ICD-10-CM | POA: Diagnosis not present

## 2016-11-22 DIAGNOSIS — Z9114 Patient's other noncompliance with medication regimen: Secondary | ICD-10-CM

## 2016-11-22 DIAGNOSIS — I69354 Hemiplegia and hemiparesis following cerebral infarction affecting left non-dominant side: Secondary | ICD-10-CM

## 2016-11-22 DIAGNOSIS — E785 Hyperlipidemia, unspecified: Secondary | ICD-10-CM | POA: Diagnosis present

## 2016-11-22 DIAGNOSIS — K760 Fatty (change of) liver, not elsewhere classified: Secondary | ICD-10-CM | POA: Diagnosis present

## 2016-11-22 DIAGNOSIS — Z800 Family history of malignant neoplasm of digestive organs: Secondary | ICD-10-CM

## 2016-11-22 DIAGNOSIS — T501X5A Adverse effect of loop [high-ceiling] diuretics, initial encounter: Secondary | ICD-10-CM | POA: Diagnosis present

## 2016-11-22 LAB — COMPREHENSIVE METABOLIC PANEL
ALK PHOS: 137 U/L — AB (ref 38–126)
ALT: 18 U/L (ref 17–63)
AST: 18 U/L (ref 15–41)
Albumin: 3.5 g/dL (ref 3.5–5.0)
Anion gap: 6 (ref 5–15)
BUN: 20 mg/dL (ref 6–20)
CALCIUM: 8.5 mg/dL — AB (ref 8.9–10.3)
CHLORIDE: 111 mmol/L (ref 101–111)
CO2: 24 mmol/L (ref 22–32)
CREATININE: 1.64 mg/dL — AB (ref 0.61–1.24)
GFR calc non Af Amer: 40 mL/min — ABNORMAL LOW (ref >60)
GFR, EST AFRICAN AMERICAN: 47 mL/min — AB (ref >60)
GLUCOSE: 92 mg/dL (ref 65–99)
Potassium: 3.6 mmol/L (ref 3.5–5.1)
SODIUM: 141 mmol/L (ref 135–145)
Total Bilirubin: 0.5 mg/dL (ref 0.3–1.2)
Total Protein: 6.9 g/dL (ref 6.5–8.1)

## 2016-11-22 LAB — URINALYSIS, ROUTINE W REFLEX MICROSCOPIC
BILIRUBIN URINE: NEGATIVE
Glucose, UA: NEGATIVE mg/dL
KETONES UR: NEGATIVE mg/dL
Nitrite: NEGATIVE
Protein, ur: NEGATIVE mg/dL
SPECIFIC GRAVITY, URINE: 1.021 (ref 1.005–1.030)
pH: 5 (ref 5.0–8.0)

## 2016-11-22 LAB — CBC WITH DIFFERENTIAL/PLATELET
BASOS PCT: 1 %
Basophils Absolute: 0.1 10*3/uL (ref 0.0–0.1)
EOS ABS: 0.1 10*3/uL (ref 0.0–0.7)
EOS PCT: 1 %
HCT: 47.4 % (ref 39.0–52.0)
HEMOGLOBIN: 15.8 g/dL (ref 13.0–17.0)
LYMPHS ABS: 2.9 10*3/uL (ref 0.7–4.0)
Lymphocytes Relative: 25 %
MCH: 28 pg (ref 26.0–34.0)
MCHC: 33.3 g/dL (ref 30.0–36.0)
MCV: 83.9 fL (ref 78.0–100.0)
MONO ABS: 1.1 10*3/uL — AB (ref 0.1–1.0)
MONOS PCT: 10 %
Neutro Abs: 7.3 10*3/uL (ref 1.7–7.7)
Neutrophils Relative %: 63 %
PLATELETS: 322 10*3/uL (ref 150–400)
RBC: 5.65 MIL/uL (ref 4.22–5.81)
RDW: 14.3 % (ref 11.5–15.5)
WBC: 11.5 10*3/uL — ABNORMAL HIGH (ref 4.0–10.5)

## 2016-11-22 LAB — SODIUM, URINE, RANDOM: SODIUM UR: 16 mmol/L

## 2016-11-22 LAB — AMMONIA: AMMONIA: 15 umol/L (ref 9–35)

## 2016-11-22 LAB — CREATININE, URINE, RANDOM: CREATININE, URINE: 371.44 mg/dL

## 2016-11-22 MED ORDER — ONDANSETRON HCL 4 MG PO TABS: 4.0000 mg | ORAL_TABLET | Freq: Four times a day (QID) | ORAL | Status: DC | PRN

## 2016-11-22 MED ORDER — SODIUM CHLORIDE 0.9 % IV BOLUS (SEPSIS)
500.0000 mL | Freq: Once | INTRAVENOUS | Status: AC
  Administered 2016-11-22: 500 mL via INTRAVENOUS

## 2016-11-22 MED ORDER — SODIUM CHLORIDE 0.9 % IV SOLN
INTRAVENOUS | Status: AC
  Administered 2016-11-22 – 2016-11-23 (×2): via INTRAVENOUS

## 2016-11-22 MED ORDER — AMLODIPINE BESYLATE 10 MG PO TABS
10.0000 mg | ORAL_TABLET | Freq: Every day | ORAL | Status: DC
  Administered 2016-11-23 – 2016-11-24 (×2): 10 mg via ORAL
  Filled 2016-11-22 (×2): qty 1

## 2016-11-22 MED ORDER — POTASSIUM CHLORIDE CRYS ER 10 MEQ PO TBCR
10.0000 meq | EXTENDED_RELEASE_TABLET | Freq: Every day | ORAL | Status: DC
Start: 2016-11-23 — End: 2016-11-24
  Administered 2016-11-23: 10 meq via ORAL
  Filled 2016-11-22: qty 1

## 2016-11-22 MED ORDER — ACETAMINOPHEN 650 MG RE SUPP: 650.0000 mg | Freq: Four times a day (QID) | RECTAL | Status: DC | PRN

## 2016-11-22 MED ORDER — DEXTROSE 5 % IV SOLN
1.0000 g | Freq: Once | INTRAVENOUS | Status: AC
  Administered 2016-11-22: 1 g via INTRAVENOUS
  Filled 2016-11-22: qty 10

## 2016-11-22 MED ORDER — ATORVASTATIN CALCIUM 40 MG PO TABS
40.0000 mg | ORAL_TABLET | Freq: Every day | ORAL | Status: DC
Start: 2016-11-23 — End: 2016-11-24
  Administered 2016-11-23 – 2016-11-24 (×2): 40 mg via ORAL
  Filled 2016-11-22 (×2): qty 1

## 2016-11-22 MED ORDER — METOPROLOL TARTRATE 50 MG PO TABS
100.0000 mg | ORAL_TABLET | Freq: Two times a day (BID) | ORAL | Status: DC
  Administered 2016-11-22 – 2016-11-24 (×4): 100 mg via ORAL
  Filled 2016-11-22 (×4): qty 2

## 2016-11-22 MED ORDER — DEXTROSE 5 % IV SOLN
1.0000 g | INTRAVENOUS | Status: DC
  Administered 2016-11-23: 1 g via INTRAVENOUS
  Filled 2016-11-22 (×2): qty 10

## 2016-11-22 MED ORDER — HYDROCODONE-ACETAMINOPHEN 5-325 MG PO TABS: 1.0000 | ORAL_TABLET | ORAL | Status: DC | PRN

## 2016-11-22 MED ORDER — HEPARIN SODIUM (PORCINE) 5000 UNIT/ML IJ SOLN
5000.0000 [IU] | Freq: Three times a day (TID) | INTRAMUSCULAR | Status: DC
Start: 2016-11-22 — End: 2016-11-24
  Administered 2016-11-22 – 2016-11-24 (×6): 5000 [IU] via SUBCUTANEOUS
  Filled 2016-11-22 (×8): qty 1

## 2016-11-22 MED ORDER — BACLOFEN 10 MG PO TABS
10.0000 mg | ORAL_TABLET | Freq: Three times a day (TID) | ORAL | Status: DC
  Administered 2016-11-23 – 2016-11-24 (×5): 10 mg via ORAL
  Filled 2016-11-22 (×5): qty 1

## 2016-11-22 MED ORDER — POLYETHYLENE GLYCOL 3350 17 G PO PACK: 17.0000 g | PACK | Freq: Every day | ORAL | Status: DC | PRN

## 2016-11-22 MED ORDER — ASPIRIN EC 81 MG PO TBEC
81.0000 mg | DELAYED_RELEASE_TABLET | Freq: Every day | ORAL | Status: DC
  Administered 2016-11-23 – 2016-11-24 (×2): 81 mg via ORAL
  Filled 2016-11-22 (×2): qty 1

## 2016-11-22 MED ORDER — ONDANSETRON HCL 4 MG/2ML IJ SOLN: 4.0000 mg | Freq: Four times a day (QID) | INTRAMUSCULAR | Status: DC | PRN

## 2016-11-22 MED ORDER — ACETAMINOPHEN 325 MG PO TABS: 650.0000 mg | ORAL_TABLET | Freq: Four times a day (QID) | ORAL | Status: DC | PRN

## 2016-11-22 NOTE — ED Notes (Signed)
Called floor to give report, was told RN in report and unavailable to take at this time.

## 2016-11-22 NOTE — ED Notes (Signed)
hospitalist at bedside

## 2016-11-22 NOTE — H&P (Signed)
History and Physical    Joel PearsonDaniel Strout ZOX:[id number] DOB: 1946-01-27 DOA: 11/22/2016  [facility]P: [facility]   Patient coming from: Home  Chief Complaint: Concern for unsafe living environment   HPI: Joel Solis is a 71 y.o. male with medical history significant for hypertension, hyperlipidemia, tobacco abuse, and history of hemorrhagic stroke with residual left-sided weakness, now presenting to the emergency department at the direction of a caseworker who visited the patient today, finding him to be covered in feces and in a house alone filled with filth and unopened prescription bottles. Patient has no complaints and insists he is doing fine. He was admitted to the hospital from 11/15/2016 until 11/17/2016, managed for encephalopathy of unclear etiology, possibly related to an elevated ammonia level. He was discharged back home with lactulose after refusing SNF. He reports that he has been doing well, but does note that he vomited several times last night and also had 2 loose stools, which he attributes to his lactulose. Caseworker who visited him today reported finding and not opened prescription bottle of lactulose. She also noted the patient be covered in feces and called an ambulance out for transport to the hospital to have the patient evaluated.  ED Course: Upon arrival to the ED, patient is found to be afebrile, saturating well on room air, and with vital signs stable. Chemistry panel reveals a serum creatinine 1.64, up from 1.00 on 11/17/2016. CBC is notable for a mild leukocytosis to 11,500 and urinalysis is suggestive of infection. Social work was consulted by the ED physician and has evaluated the patient and discussed his case with his family at the bedside. Urine was sent for culture, 500 mL normal saline was administered, and the patient was treated with empiric Rocephin. He remained hemodynamically stable in the ED, in no apparent respiratory distress, and has  been alert and fully oriented. He will be observed on the medical/surgical unit for ongoing evaluation and management of UTI and nausea, vomiting, and loose stools with an acute kidney injury.   Review of Systems:  All other systems reviewed and apart from HPI, are negative.  Past Medical History:  Diagnosis Date  . Essential hypertension   . Hemorrhagic stroke (HCC)   . HLD (hyperlipidemia)   . S/P VP shunt   . Tobacco abuse     Past Surgical History:  Procedure Laterality Date  . cyst      cyst in neck  . VENTRICULOPERITONEAL SHUNT       reports that he has been smoking.  He has never used smokeless tobacco. He reports that he drinks alcohol. He reports that he does not use drugs.  Allergies  Allergen Reactions  . Shellfish Allergy Nausea And Vomiting    Family History  Problem Relation Age of Onset  . Intracerebral hemorrhage Mother   . Lung cancer Father   . Esophageal cancer Brother      Prior to Admission medications   Medication Sig Start Date End Date Taking? Authorizing Provider  amLODipine (NORVASC) 10 MG tablet Take 10 mg by mouth daily.   Yes [provider]  aspirin EC 81 MG tablet Take 81 mg by mouth daily.   Yes [provider]  atorvastatin (LIPITOR) 40 MG tablet Take 40 mg by mouth daily.   Yes [provider]  baclofen (LIORESAL) 10 MG tablet Take 10 mg by mouth 3 (three) times daily.   Yes [provider]  furosemide (LASIX) 20 MG tablet Take 20 mg  by mouth daily.    Yes [provider]  lactulose (CHRONULAC) 10 GM/15ML solution Take 30 mLs (20 g total) by mouth 2 (two) times daily. 11/17/16  Yes Osvaldo Shipper, MD  lisinopril (PRINIVIL,ZESTRIL) 20 MG tablet Take 20 mg by mouth daily.   Yes [provider]  metoprolol tartrate (LOPRESSOR) 100 MG tablet Take 100 mg by mouth 2 (two) times daily.   Yes [provider]  potassium chloride (MICRO-K) 10 MEQ CR capsule Take 10 mEq by mouth daily.    Yes [provider]    Physical Exam: Vitals:   11/22/16 1436 11/22/16 1500 11/22/16 1600 11/22/16 1700  BP: 135/71 (!) 142/75 110/88 (!) 144/77  Pulse: 60 63  65  Resp:    18  Temp:      TempSrc:      SpO2: 97% 100%  98%      Constitutional: NAD, calm, disheveled. Feet caked in feces.  Eyes: PERTLA, lids and conjunctivae normal ENMT: Mucous membranes are moist. Posterior pharynx clear of any exudate or lesions.   Neck: normal, supple, no masses, no thyromegaly Respiratory: clear to auscultation bilaterally, no wheezing, no crackles. Normal respiratory effort.   Cardiovascular: S1 & S2 heard, regular rate and rhythm. No significant JVD. Abdomen: No distension, no tenderness, no masses palpated. Bowel sounds normal.  Musculoskeletal: no clubbing / cyanosis. No joint deformity upper and lower extremities.   Skin: no significant rashes, lesions, ulcers. Warm, dry, well-perfused. Neurologic: Mild left facial droop and left-sided extremity weakness noted (family reports as baseline).   Psychiatric: Alert and oriented x 3. Calm and cooperative.     Labs on Admission: I have personally reviewed following labs and imaging studies  CBC:  Recent Labs Lab 11/17/16 0622 11/22/16 1431  WBC 11.1* 11.5*  NEUTROABS  --  7.3  HGB 15.7 15.8  HCT 48.9 47.4  MCV 85.6 83.9  PLT 253 322   Basic Metabolic Panel:  Recent Labs Lab 11/16/16 0249 11/16/16 1125 11/17/16 0622 11/22/16 1431  NA  --  140 138 141  K  --  3.3* 3.4* 3.6  CL  --  110 111 111  CO2  --  24 22 24   GLUCOSE  --  96 94 92  BUN  --  8 8 20   CREATININE  --  1.02 1.00 1.64*  CALCIUM  --  8.6* 8.5* 8.5*  MG 2.1  --   --   --    GFR: Estimated Creatinine Clearance: 45.2 mL/min (A) (by C-G formula based on SCr of 1.64 mg/dL (H)). Liver Function Tests:  Recent Labs Lab 11/22/16 1431  AST 18  ALT 18  ALKPHOS 137*  BILITOT 0.5  PROT 6.9  ALBUMIN 3.5   No results for input(s): LIPASE, AMYLASE in  the last 168 hours.  Recent Labs Lab 11/17/16 0622 11/22/16 1431  AMMONIA 35 15   Coagulation Profile:  Recent Labs Lab 11/15/16 2045  INR 1.12   Cardiac Enzymes:  Recent Labs Lab 11/15/16 2052 11/16/16 0249 11/16/16 0824  TROPONINI 0.03* 0.03* 0.04*   BNP (last 3 results) No results for input(s): PROBNP in the last 8760 hours. HbA1C: No results for input(s): HGBA1C in the last 72 hours. CBG: No results for input(s): GLUCAP in the last 168 hours. Lipid Profile: No results for input(s): CHOL, HDL, LDLCALC, TRIG, CHOLHDL, LDLDIRECT in the last 72 hours. Thyroid Function Tests: No results for input(s): TSH, T4TOTAL, FREET4, T3FREE, THYROIDAB in the last 72 hours. Anemia Panel:  No results for input(s): VITAMINB12, FOLATE, FERRITIN, TIBC, IRON, RETICCTPCT in the last 72 hours. Urine analysis:    Component Value Date/Time   COLORURINE AMBER (A) 11/22/2016 1336   APPEARANCEUR HAZY (A) 11/22/2016 1336   LABSPEC 1.021 11/22/2016 1336   PHURINE 5.0 11/22/2016 1336   GLUCOSEU NEGATIVE 11/22/2016 1336   HGBUR SMALL (A) 11/22/2016 1336   BILIRUBINUR NEGATIVE 11/22/2016 1336   KETONESUR NEGATIVE 11/22/2016 1336   PROTEINUR NEGATIVE 11/22/2016 1336   NITRITE NEGATIVE 11/22/2016 1336   LEUKOCYTESUR LARGE (A) 11/22/2016 1336   Sepsis Labs: @LABRCNTIP (procalcitonin:4,lacticidven:4) ) Recent Results (from the past 240 hour(s))  Urine Culture     Status: Abnormal   Collection Time: 11/15/16  8:46 PM  Result Value Ref Range Status   Specimen Description URINE, CLEAN CATCH  Final   Special Requests NONE  Final   Culture MULTIPLE SPECIES PRESENT, SUGGEST RECOLLECTION (A)  Final   Report Status 11/17/2016 FINAL  Final     Radiological Exams on Admission: No results found.  EKG: Not performed.   Assessment/Plan  1. Acute kidney injury  - Pt reports vomiting and loose stools the day prior to admission  - He is found to have a SCr of 1.64, up from 1.00 less than a week  earlier  - Likely prerenal in setting of N/V and loose stools, with Lasix and lisinopril likely contributing  - He was given 500 cc NS in ED - Plan to continue IVF hydration, obtain renal US to exclude an obstructive etiology, hold lisinopril and Lasix, repeat chemistries in am   2. UTI  - Pt reports suprapubic discomfort, mild leukocytosis present, and UA is consistent with infection  - Urine has been sent for cultures; prior urine culture grew multiple species  - Empiric Rocephin started in ED, plan to continue while awaiting culture data    3. Hx of hemorrhagic CVA with residual deficits  - Appears to be stable; pt and family at bedside confirm that the facial droop and left-sided weakness are chronic, at baseline  - No evidence for acute CVA  - Continue Lipitor and ASA 81   4. Hypertension  - BP at goal  - Continue Lopressor and Norvasc    5. ?Unsafe living situation  - Pt was sent to ED by a case worker who had found the patient to be living alone in filthy conditions - Social work was consulted by ED physician and has been working with patient and family; SW involvement much appreciated   - Family reports wanting for patient to transition to a nursing facility; pt seems to be fully oriented and refuses this    DVT prophylaxis: sq heparin Code Status: Full  Family Communication: Daughter updated at bedside Disposition Plan: Observe on med-surg Consults called: None Admission status: Observation    Briscoe Deutscher, MD [facility] Pager [phone]  If 7PM-7AM, please contact night-coverage [email or web address] Password [email or web address]  11/22/2016, 7:18 PM

## 2016-11-22 NOTE — ED Notes (Signed)
Called floor to give report was left on hold for over 6 minutes without anyone picking back up.

## 2016-11-22 NOTE — ED Notes (Signed)
Per Social worker, patient already has home health services that are coming out.

## 2016-11-22 NOTE — Progress Notes (Signed)
CSW spoke with EDP who is awaiting results of one additional urine test before a decision is made to D/C or admit the pt.  Per EDP, pt will likely D/C.  Per daytime CSW pt is not agreeable to SNF, pt wishes to return home and pt already has home health.    Dorothe PeaJonathan F. Shalia Bartko, Francesco SorLCSWA, LCAS, CSI Clinical Social Worker Ph: [phone]

## 2016-11-22 NOTE — Progress Notes (Signed)
CSW contacted CM Durward Mallard(Camille) and confirmed [facility] Pt will be discharged back to home once medically stable for discharge. No further concerns have been presented at this time.       Claude MangesKierra S. Carole Doner, MSW, LCSW-A Emergency Department Clinical Social Worker [phone]

## 2016-11-22 NOTE — ED Triage Notes (Signed)
Per [facility] patient was found covered in dry feces with house a mess with pills everywhere and unfilled prescription of Lactulose by Case worker from [facility] who went to work on advanced directives for patient.  Patient lives alone per EMS and was recently [facility] for encephalopathy and hallucinations.

## 2016-11-22 NOTE — ED Provider Notes (Signed)
Please see previous physicians note regarding patient's presenting history and physical, initial ED course, and associated medical decision making.  71 year old male who presents from home with concern for inability to care for self. Pending UA at time of sign out. UA suggestive of possible UTI. Patient states intermittent dysuria and frequency. Blood work reviewed and with mild AKI. Discussed with patient and family. Patient is now open to potentially going to assisted living facility or nursing facility. Family concerned that now with recurrent ED visits for confusion and inability to care for self, that he is not safe for discharge. Discussed with family patient likely meeting observation criteria, and likely would not meet inpatient 3 day stay for placement into facility as covered by medicare. They expressed understanding of this, but still would not want him to go home by self. Patient agreeable to stay and if needed to be placed into living facility. SW has provided family with list of living facilities as well. Discussed with Dr. Antionette Charpyd who will admit for observation.   Lavera GuiseLiu, Uzair Godley Duo, MD 11/22/16 Zollie [id number]

## 2016-11-22 NOTE — Progress Notes (Signed)
CSW dropped by pt's room due to family being present. CSW informed family members that pt would be discharged back home once medically stable for discharge. Pt's daughter informed CSW that this is pt's second admission within 7 days and she is concerned about pt. Pt is wanting to discharge home but pt's daughter is unsure about that. CSW suggested that pt and pt's daughter speak with the doctor to determine what is in the best interest of the pt. CSW will continue to follow and assist with needs as they are present.   Claude MangesKierra S. Kharter Sestak, MSW, LCSW-A Emergency Department Clinical Social Worker [phone]

## 2016-11-22 NOTE — ED Notes (Signed)
Social worker at bedside.

## 2016-11-22 NOTE — Progress Notes (Addendum)
CSW consulted regarding iving conditions for pt. Pt states that he was at home and was found by his social worker who suggested he come get checked out. Pt mentions that he lives alone and isn't concerned. CSW asked pt if pt was already receiving HH and pt said " apparently so". CSW asked pt if pt's daughter could be contacted just to gather more information in which  pt is agreeable. Pt's informed CSW that pt does not want to be placed within a  SNF, so CSW will assist in needs present.    Claude MangesKierra S. Davon Folta, MSW, LCSW-A Emergency Department Clinical Social Worker [phone]

## 2016-11-22 NOTE — ED Notes (Signed)
Bed: WA11 Expected date:  Expected time:  Means of arrival:  Comments: EMS- AMS 

## 2016-11-22 NOTE — ED Notes (Signed)
Family asking when social work would be coming to talk to them.  Informed them that lady that came in earlier and spoke with them was Child psychotherapistsocial worker.  They were told a male social worker was supposed to be talking to them.  Informed them that I had only seen two male social workers here today, but I would call to see if they could come and speak with them.  Left message on social worker voicemail letting them know family for patient would like to speak with them.

## 2016-11-22 NOTE — Progress Notes (Signed)
CSW met with pt at request of daytme EDP after reviewing chart and noting pt had been informed of all options by previous CSW.  CSW reiterated what daytime CSW had explained to pt and family and provided pt and family with SNF and ALF lists.  Please reconsult if future social work needs arise.  CSW signing off, as social work intervention is no longer needed.  Alphonse Guild. Rielly Corlett, Reed Pandy, CSI Clinical Social Worker Ph: [phone]

## 2016-11-22 NOTE — ED Provider Notes (Signed)
Emergency Department Provider Note   I have reviewed the triage vital signs and the nursing notes.   HISTORY  Chief Complaint found in unclean living situation   HPI Joel Solis is a 71 y.o. male with PMH of HTN, prior CVA with VP shunt, HLD, and recent admission for metabolic encephalopathy who presents to the emergency room in for evaluation of concern for his living conditions. The patient was discharged to hospitalist service on 11/17/16 with elevated Ammonia of unclear significance and AMS. He had an EEG and MRI which were both unremarkable. His confusion improved and he was discharged home with lactulose. Patient lives alone but his daughter lives here in [place]. His social worker came today to help with advanced directive forms when she reportedly found him sitting and dried feces with pills scattered on the floor.   The patient states last night he had the pills on a side table when he accidentally knocked him to the floor. He also states he's had difficulty with the lactulose and diarrhea. The patient states he wasn't aware that this is the intended effect of the medicine. He reports he feels fine and would like to return home as soon as possible. He reports walking with a walker occasionally but mostly uses a power wheelchair which is at his house. He states that his daughter checks on him daily. He notes that normally he is able to cook for himself and clean himself thoroughly. He denies any fever, chills, chest pain, difficulty breathing. No abdominal discomfort. Denies drinking or illicit drug use.   Past Medical History:  Diagnosis Date  . Essential hypertension   . Hemorrhagic stroke (HCC)   . HLD (hyperlipidemia)   . S/P VP shunt   . Tobacco abuse     Patient Active Problem List   Diagnosis Date Noted  . Acute lower UTI 11/22/2016  . AKI (acute kidney injury) (HCC) 11/22/2016  . Inneffective medication administration routine at home 11/22/2016  . Dirty living  conditions 11/22/2016  . Elevated troponin 11/16/2016  . Acute metabolic encephalopathy 11/15/2016  . Hypokalemia 11/15/2016  . Essential hypertension   . HLD (hyperlipidemia)   . 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 S/P VP shunt   . Tobacco abuse     Past Surgical History:  Procedure Laterality Date  . cyst      cyst in neck  . VENTRICULOPERITONEAL SHUNT      Current Outpatient Rx  . Order #: [id number] Class: Historical Med  . Order #: [id number] Class: Historical Med  . Order #: [id number] Class: Historical Med  . Order #: [id number] Class: Historical Med  . Order #: [id number] Class: Historical Med  . Order #: [id number] Class: Print  . Order #: [id number] Class: Historical Med  . Order #: [id number] Class: Historical Med  . Order #: [id number] Class: Historical Med    Allergies Shellfish allergy  Family History  Problem Relation Age of Onset  . Intracerebral hemorrhage Mother   . Lung cancer Father   . Esophageal cancer Brother     Social History Social History  Substance Use Topics  . Smoking status: Current Every Day Smoker  . Smokeless tobacco: Never Used  . Alcohol use Yes     Comment: not drank in 10 months    Review of Systems  Constitutional: No fever/chills Eyes: No visual changes. ENT: No sore throat. Cardiovascular: Denies chest pain. Respiratory: Denies shortness of breath. Gastrointestinal: No abdominal pain.  No nausea, no vomiting.  No diarrhea.  No constipation. Genitourinary: Negative for dysuria. Musculoskeletal: Negative for back pain. Skin: Negative for rash. Neurological: Negative for headaches, focal weakness or numbness.  10-point ROS otherwise negative.  ____________________________________________   PHYSICAL EXAM:  VITAL SIGNS: ED Triage Vitals [11/22/16 1311]  Enc Vitals Group     BP 109/69     Pulse Rate 70     Resp 17     Temp 98 F (36.7 C)     Temp Source Oral     SpO2 98 %    Constitutional: Patient arrives with dried feces on clothes. Awake, alert, and oriented x 4. Eyes: Conjunctivae are normal.  Head: Atraumatic. Nose: No congestion/rhinnorhea. Mouth/Throat: Mucous membranes are dry.  Neck: No stridor.  Cardiovascular: Normal rate, regular rhythm. Good peripheral circulation. Grossly normal heart sounds.   Respiratory: Normal respiratory effort.  No retractions. Lungs CTAB. Gastrointestinal: Soft and nontender. No distention.  Musculoskeletal: No lower extremity tenderness nor edema. No gross deformities of extremities. Neurologic:  Normal speech and language. No gross focal neurologic deficits are appreciated.  Skin:  Skin is warm, dry and intact. No rash noted. Psychiatric: Mood and affect are normal. Speech and behavior are normal.  ____________________________________________   LABS (all labs ordered are listed, but only abnormal results are displayed)  Labs Reviewed  COMPREHENSIVE METABOLIC PANEL - Abnormal; Notable for the following:       Result Value   Creatinine, Ser 1.64 (*)    Calcium 8.5 (*)    Alkaline Phosphatase 137 (*)    GFR calc non Af Amer 40 (*)    GFR calc Af Amer 47 (*)    All other components within normal limits  CBC WITH DIFFERENTIAL/PLATELET - Abnormal; Notable for the following:    WBC 11.5 (*)    Monocytes Absolute 1.1 (*)    All other components within normal limits  URINALYSIS, ROUTINE W REFLEX MICROSCOPIC - Abnormal; Notable for the following:    Color, Urine AMBER (*)    APPearance HAZY (*)    Hgb urine dipstick SMALL (*)    Leukocytes, UA LARGE (*)    Bacteria, UA RARE (*)    Squamous Epithelial / LPF 0-5 (*)    All other components within normal limits  URINE CULTURE  AMMONIA  SODIUM, URINE, RANDOM  UREA NITROGEN, URINE  CREATININE, URINE, RANDOM   ____________________________________________   PROCEDURES  Procedure(s) performed:    Procedures  None ____________________________________________   INITIAL IMPRESSION / ASSESSMENT AND PLAN / ED COURSE  Pertinent labs & imaging results that were available during my care of the patient were reviewed by me and considered in my medical decision making (see chart for details).  Patient resents to the emergency department for evaluation of concern for unsafe living conditions. The patient is awake and alert to person, place, time, situation. He has baseline left-sided weakness from prior stroke but no new deficits. He is adamant about wanting to return home and is refusing any discussion regarding temporary rehabilitation placement. He would be open to additional home health. He did give me permission to call and discuss this with his daughter who lives in town. Plan for repeat labs including ammonia and reassess.  03:07 PM Patient refusing placement for social work as well. Both they and I attempted to contact his daughter regarding his home situation but she could not be reached. He has an outpatient Child psychotherapistsocial worker and home health team already. Following labs and will reassess.   I had a Gryffin Altice conversation with  the patient and family. UA pending. Patient is adamant about not being placed in a nursing home. He is awake and alert and seems able to make decisions for himself.   Care transferred to Dr. Verdie Mosher who will follow UA and reassess. ____________________________________________  FINAL CLINICAL IMPRESSION(S) / ED DIAGNOSES  Final diagnoses:  AKI (acute kidney injury) (HCC)  Acute lower UTI     MEDICATIONS GIVEN DURING THIS VISIT:  Medications  cefTRIAXone (ROCEPHIN) 1 g in dextrose 5 % 50 mL IVPB (1 g Intravenous New Bag/Given 11/22/16 1835)  sodium chloride 0.9 % bolus 500 mL (0 mLs Intravenous Stopped 11/22/16 1745)     NEW OUTPATIENT MEDICATIONS STARTED DURING THIS VISIT:  None   Note:  This document was prepared using Dragon voice recognition software and may  include unintentional dictation errors.  Alona Bene, MD Emergency Medicine    Jaysean Manville, Arlyss Repress, MD 11/22/16 [phone]

## 2016-11-22 NOTE — Progress Notes (Signed)
CSW reached out to Pt's daughter Irving Burtonmily, no answer so a voicemail has been left. CSW will continue to follow up with needs.   Claude MangesKierra S. Jaislyn Blinn, MSW, LCSW-A Emergency Department Clinical Social Worker [phone]

## 2016-11-23 DIAGNOSIS — R197 Diarrhea, unspecified: Secondary | ICD-10-CM | POA: Diagnosis present

## 2016-11-23 DIAGNOSIS — K760 Fatty (change of) liver, not elsewhere classified: Secondary | ICD-10-CM | POA: Diagnosis present

## 2016-11-23 DIAGNOSIS — I10 Essential (primary) hypertension: Secondary | ICD-10-CM

## 2016-11-23 DIAGNOSIS — T501X5A Adverse effect of loop [high-ceiling] diuretics, initial encounter: Secondary | ICD-10-CM | POA: Diagnosis present

## 2016-11-23 DIAGNOSIS — G9341 Metabolic encephalopathy: Secondary | ICD-10-CM | POA: Diagnosis present

## 2016-11-23 DIAGNOSIS — N179 Acute kidney failure, unspecified: Principal | ICD-10-CM

## 2016-11-23 DIAGNOSIS — E785 Hyperlipidemia, unspecified: Secondary | ICD-10-CM | POA: Diagnosis present

## 2016-11-23 DIAGNOSIS — I69392 Facial weakness following cerebral infarction: Secondary | ICD-10-CM | POA: Diagnosis not present

## 2016-11-23 DIAGNOSIS — Z982 Presence of cerebrospinal fluid drainage device: Secondary | ICD-10-CM | POA: Diagnosis not present

## 2016-11-23 DIAGNOSIS — N390 Urinary tract infection, site not specified: Secondary | ICD-10-CM | POA: Diagnosis present

## 2016-11-23 DIAGNOSIS — I69354 Hemiplegia and hemiparesis following cerebral infarction affecting left non-dominant side: Secondary | ICD-10-CM | POA: Diagnosis not present

## 2016-11-23 DIAGNOSIS — T445X5A Adverse effect of predominantly beta-adrenoreceptor agonists, initial encounter: Secondary | ICD-10-CM | POA: Diagnosis present

## 2016-11-23 DIAGNOSIS — R410 Disorientation, unspecified: Secondary | ICD-10-CM | POA: Diagnosis present

## 2016-11-23 DIAGNOSIS — Z800 Family history of malignant neoplasm of digestive organs: Secondary | ICD-10-CM | POA: Diagnosis not present

## 2016-11-23 DIAGNOSIS — Z591 Inadequate housing: Secondary | ICD-10-CM | POA: Diagnosis not present

## 2016-11-23 DIAGNOSIS — Z801 Family history of malignant neoplasm of trachea, bronchus and lung: Secondary | ICD-10-CM | POA: Diagnosis not present

## 2016-11-23 DIAGNOSIS — Z91013 Allergy to seafood: Secondary | ICD-10-CM | POA: Diagnosis not present

## 2016-11-23 DIAGNOSIS — G9340 Encephalopathy, unspecified: Secondary | ICD-10-CM

## 2016-11-23 LAB — BASIC METABOLIC PANEL
Anion gap: 9 (ref 5–15)
BUN: 16 mg/dL (ref 6–20)
CALCIUM: 8.4 mg/dL — AB (ref 8.9–10.3)
CO2: 27 mmol/L (ref 22–32)
Chloride: 109 mmol/L (ref 101–111)
Creatinine, Ser: 1.15 mg/dL (ref 0.61–1.24)
GFR calc Af Amer: >60 mL/min (ref >60)
GLUCOSE: 88 mg/dL (ref 65–99)
POTASSIUM: 3 mmol/L — AB (ref 3.5–5.1)
Sodium: 145 mmol/L (ref 135–145)

## 2016-11-23 LAB — CBC WITH DIFFERENTIAL/PLATELET
Basophils Absolute: 0.1 10*3/uL (ref 0.0–0.1)
Basophils Relative: 2 %
EOS PCT: 4 %
Eosinophils Absolute: 0.3 10*3/uL (ref 0.0–0.7)
HEMATOCRIT: 46.4 % (ref 39.0–52.0)
Hemoglobin: 15.3 g/dL (ref 13.0–17.0)
LYMPHS ABS: 3.3 10*3/uL (ref 0.7–4.0)
LYMPHS PCT: 39 %
MCH: 27.9 pg (ref 26.0–34.0)
MCHC: 33 g/dL (ref 30.0–36.0)
MCV: 84.7 fL (ref 78.0–100.0)
MONO ABS: 0.8 10*3/uL (ref 0.1–1.0)
Monocytes Relative: 9 %
NEUTROS ABS: 4 10*3/uL (ref 1.7–7.7)
Neutrophils Relative %: 46 %
PLATELETS: 291 10*3/uL (ref 150–400)
RBC: 5.48 MIL/uL (ref 4.22–5.81)
RDW: 14 % (ref 11.5–15.5)
WBC: 8.6 10*3/uL (ref 4.0–10.5)

## 2016-11-23 LAB — GLUCOSE, CAPILLARY: Glucose-Capillary: 73 mg/dL (ref 65–99)

## 2016-11-23 LAB — URINE CULTURE

## 2016-11-23 NOTE — Evaluation (Signed)
Physical Therapy Evaluation Patient Details Name: Joel PearsonDaniel Solis MRN: [id number] DOB: 1945/10/09 Today's Date: 11/23/2016   History of Present Illness  Pt admitted through ED with AMS and with hx of CVA and residual L side deficits  Clinical Impression  Pt admitted as above and presenting with functional mobility limitations 2* generalized weakness, residual deficits associated with previous CVA and balance deficits.  Dependent on acute stay progress, pt could benefit from follow up rehab at SNF level to maximize IND and safety prior to dc home.    Follow Up Recommendations SNF;Home health PT;Supervision/Assistance - 24 hour    Equipment Recommendations  None recommended by PT    Recommendations for Other Services OT consult     Precautions / Restrictions Precautions Precautions: Fall Restrictions Weight Bearing Restrictions: No      Mobility  Bed Mobility Overal bed mobility: Modified Independent             General bed mobility comments: used rail and momentum, no physical assist   Transfers Overall transfer level: Needs assistance Equipment used: Hemi-walker Transfers: Sit to/from BJ'sStand;Stand Pivot Transfers Sit to Stand: Min guard Stand pivot transfers: Min assist       General transfer comment: utilized hemi-walker  Ambulation/Gait Ambulation/Gait assistance: Min assist;+2 physical assistance;+2 safety/equipment Ambulation Distance (Feet): 3 Feet Assistive device: Hemi-walker Gait Pattern/deviations: Step-to pattern;Decreased step length - right;Decreased step length - left;Decreased stance time - left;Decreased stride length;Shuffle;Trunk flexed;Narrow base of support;Scissoring     General Gait Details: Pt states able to walk several hundred feet following CVA years ago but ambulatory ability has deteriorated to point of pt ambulating minimally and relying mainly on transfer pole to move bed<>WC<>recliner  Stairs            Wheelchair Mobility     Modified Rankin (Stroke Patients Only)       Balance Overall balance assessment: Needs assistance Sitting-balance support: No upper extremity supported;Feet supported Sitting balance-Leahy Scale: Good     Standing balance support: Single extremity supported Standing balance-Leahy Scale: Poor                               Pertinent Vitals/Pain Pain Assessment: 0-10 Pain Score: 2  Pain Location: buttocks Pain Descriptors / Indicators: Sore Pain Intervention(s): Limited activity within patient's tolerance;Monitored during session    Home Living Family/patient expects to be discharged to:: Private residence Living Arrangements: Alone Available Help at Discharge: Family;Available PRN/intermittently Type of Home: Apartment Home Access: Level entry     Home Layout: One level Home Equipment: Wheelchair - power;Tub bench;Hand held shower head;Other (comment);Adaptive equipment      Prior Function Level of Independence: Independent with assistive device(s)         Comments: uses transfer pole independently, reports daughter helping with IADL     Hand Dominance   Dominant Hand: Right    Extremity/Trunk Assessment   Upper Extremity Assessment Upper Extremity Assessment: LUE deficits/detail LUE Deficits / Details: longstanding deficits from prior CVA;  LUE Coordination: decreased fine motor;decreased gross motor    Lower Extremity Assessment Lower Extremity Assessment: LLE deficits/detail LLE Deficits / Details: severe LLE limited ROM due to spastic hemiplegia, flexion and adduction of left leg in standing with inability to place LLE on ground LLE Sensation: decreased light touch;decreased proprioception LLE Coordination: decreased fine motor;decreased gross motor       Communication   Communication: No difficulties  Cognition Arousal/Alertness: Awake/alert Behavior During Therapy: [facility]  for tasks assessed/performed Overall Cognitive Status: Within  Functional Limits for tasks assessed                                 General Comments: pt in a pool of urine, did not call for help      General Comments General comments (skin integrity, edema, etc.): On arrival pt with significant amount of urine in bed - had not called for assist    Exercises     Assessment/Plan    PT Assessment Patient needs continued PT services  PT Problem List Decreased strength;Decreased activity tolerance;Decreased balance;Decreased mobility;Decreased cognition;Impaired tone       PT Treatment Interventions DME instruction;Functional mobility training;Therapeutic activities;Therapeutic exercise;Balance training;Neuromuscular re-education;Patient/family education;Modalities    PT Goals (Current goals can be found in the Care Plan section)  Acute Rehab PT Goals Patient Stated Goal: to return home PT Goal Formulation: With patient Time For Goal Achievement: 11/30/16 Potential to Achieve Goals: Fair    Frequency Min 3X/week   Barriers to discharge Decreased caregiver support Pt does not have 24/7    Co-evaluation               AM-PAC PT "6 Clicks" Daily Activity  Outcome Measure Difficulty turning over in bed (including adjusting bedclothes, sheets and blankets)?: A Little Difficulty moving from lying on back to sitting on the side of the bed? : A Little Difficulty sitting down on and standing up from a chair with arms (e.g., wheelchair, bedside commode, etc,.)?: A Lot Help needed moving to and from a bed to chair (including a wheelchair)?: A Little Help needed walking in hospital room?: A Lot Help needed climbing 3-5 steps with a railing? : Total 6 Click Score: 14    End of Session Equipment Utilized During Treatment: Gait belt Activity Tolerance: Patient tolerated treatment well Patient left: in chair;with call bell/phone within reach;with nursing/sitter in room Nurse Communication: Mobility status PT Visit Diagnosis:  Other abnormalities of gait and mobility (R26.89)    Time: 1610-9604 PT Time Calculation (min) (ACUTE ONLY): 22 min   Charges:   PT Evaluation $PT Eval Moderate Complexity: 1 Procedure     PT G Codes:   PT G-Codes **NOT FOR INPATIENT CLASS** Functional Assessment Tool Used: AM-PAC 6 Clicks Basic Mobility Functional Limitation: Mobility: Walking and moving around Mobility: Walking and Moving Around Current Status (V4098): At least 40 percent but less than 60 percent impaired, limited or restricted Mobility: Walking and Moving Around Goal Status [phone]): At least 1 percent but less than 20 percent impaired, limited or restricted    Pg [phone]   Jhayla Podgorski 11/23/2016, 1:46 PM

## 2016-11-23 NOTE — Progress Notes (Signed)
[facility] PROGRESS NOTE    Progress Note  Joel Solis  ZOX:[id number] DOB: 11/26/1945 DOA: 11/22/2016 PCP: Darrick Grinder, [facility]     Brief Narrative:   Joel Solis is an 71 y.o. male past medical history significant for essential hypertension, hemorrhagic stroke with residual left-sided weakness now presents to the emergency department by referral caseworker visited the patient on the day of admission and found him covered in feces alone in his house alone, with an unopen prescription bottles. In the ED he was found to be afebrile saturations above 90%, with a creatinine 1.6 (with a baseline of 1.0) with a mild leukocytosis of 11 urinalysis suggestive of infection.  Assessment/Plan:   AKI (acute kidney injury) (HCC) Patient reports vomiting with loose stools started day prior to admission. Likely prerenal in etiology in the setting of Lasix and ACE inhibitor use.  He was started on IV fluid hydration, and his creatinine returned to baseline.   Mild elevation of his ongoing phosphatase: Check abdominal ultrasound, question if this is a component of cirrhosis.  UTI: Suprapubic discomfort UA showing signs consistent with infections. Cultures are pending agree with IV Rocephin awaiting culture data.  History of hemorrhagic CVA with residual deficits: Seems to be a baseline continue aspirin and Lipitor.  Essential hypertension: Blood pressure at goal.  Unsafe living situation: Patient was sent to the ED by case manager, will try to make arrangements for skilled nursing facility.  Diarrhea: He has remained afebrile, has no leukocytosis. Had an episode of watery diarrhea today we'll continue to monitor.  DVT prophylaxis: lovenox Family Communication:none Disposition Plan/Barrier to D/C: hopefully in 2- days Code Status:     Code Status Orders        Start     Ordered   11/22/16 1914  Full code  Continuous     11/22/16 1918    Code Status  History    Date Active Date Inactive Code Status Order ID Comments User Context   11/15/2016  8:29 PM 11/17/2016  5:00 PM Full Code [id number]  Lorretta Harp, MD ED        IV Access:    Peripheral IV   Procedures and diagnostic studies:   US Renal  Result Date: 11/22/2016 CLINICAL DATA:  71 year old male with acute kidney injury. EXAM: RENAL / URINARY TRACT ULTRASOUND COMPLETE COMPARISON:  None. FINDINGS: Right Kidney: Length: 10.3 cm with moderate cortical atrophy. Echogenicity within normal limits. No mass or hydronephrosis visualized. Left Kidney: Length: 11.1 cm with moderate cortical atrophy. Echogenicity within normal limits. No mass or hydronephrosis visualized. Bladder: Appears normal for degree of bladder distention. IMPRESSION: Moderate bilateral renal cortical atrophy without other significant abnormality. No evidence of hydronephrosis. Electronically Signed   By: Harmon Pier M.D.   On: 11/22/2016 19:32     Medical Consultants:    None.  Anti-Infectives:   IV Rocephin.  Subjective:    Joel Solis no new complains, he would like to think about going to a skilled nursing facility.  Objective:    Vitals:   11/22/16 1700 11/22/16 1800 11/22/16 2026 11/23/16 0454  BP: (!) 144/77 (!) 142/76 137/69 (!) 141/82  Pulse: 65 65 79 64  Resp: 18 18 (!) 22 20  Temp:   98 F (36.7 C) 98.4 F (36.9 C)  TempSrc:   Oral Oral  SpO2: 98% 97% 99% 98%  Height:   5\' 8"  (1.727 m)     Intake/Output Summary (Last 24 hours) at 11/23/16 9562 Last data  filed at 11/23/16 0452  Gross per 24 hour  Intake          1058.75 ml  Output                0 ml  Net          1058.75 ml   There were no vitals filed for this visit.  Exam: General exam:In no acute distress Respiratory system: Good air movement clear to auscultation Cardiovascular system: Positive S1 and S2 regular rate and rhythm no JVD. Gastrointestinal system: Bowel sounds soft nontender nondistended Central nervous  system: Alert and oriented 3. Extremities: No edema Skin: No rashes Psychiatry: Judgment and insight appear appropriate.   Data Reviewed:    Labs: Basic Metabolic Panel:  Recent Labs Lab 11/16/16 1125 11/17/16 0622 11/22/16 1431 11/23/16 0540  NA 140 138 141 145  K 3.3* 3.4* 3.6 3.0*  CL 110 111 111 109  CO2 24 22 24 27   GLUCOSE 96 94 92 88  BUN 8 8 20 16   CREATININE 1.02 1.00 1.64* 1.15  CALCIUM 8.6* 8.5* 8.5* 8.4*   GFR Estimated Creatinine Clearance: 64.5 mL/min (by C-G formula based on SCr of 1.15 mg/dL). Liver Function Tests:  Recent Labs Lab 11/22/16 1431  AST 18  ALT 18  ALKPHOS 137*  BILITOT 0.5  PROT 6.9  ALBUMIN 3.5   No results for input(s): LIPASE, AMYLASE in the last 168 hours.  Recent Labs Lab 11/17/16 0622 11/22/16 1431  AMMONIA 35 15   Coagulation profile No results for input(s): INR, PROTIME in the last 168 hours.  CBC:  Recent Labs Lab 11/17/16 0622 11/22/16 1431 11/23/16 0540  WBC 11.1* 11.5* 8.6  NEUTROABS  --  7.3 4.0  HGB 15.7 15.8 15.3  HCT 48.9 47.4 46.4  MCV 85.6 83.9 84.7  PLT 253 322 291   Cardiac Enzymes:  Recent Labs Lab 11/16/16 0824  TROPONINI 0.04*   BNP (last 3 results) No results for input(s): PROBNP in the last 8760 hours. CBG:  Recent Labs Lab 11/23/16 0745  GLUCAP 73   D-Dimer: No results for input(s): DDIMER in the last 72 hours. Hgb A1c: No results for input(s): HGBA1C in the last 72 hours. Lipid Profile: No results for input(s): CHOL, HDL, LDLCALC, TRIG, CHOLHDL, LDLDIRECT in the last 72 hours. Thyroid function studies: No results for input(s): TSH, T4TOTAL, T3FREE, THYROIDAB in the last 72 hours.  Invalid input(s): FREET3 Anemia work up: No results for input(s): VITAMINB12, FOLATE, FERRITIN, TIBC, IRON, RETICCTPCT in the last 72 hours. Sepsis Labs:  Recent Labs Lab 11/17/16 0622 11/22/16 1431 11/23/16 0540  WBC 11.1* 11.5* 8.6   Microbiology Recent Results (from the past  240 hour(s))  Urine Culture     Status: Abnormal   Collection Time: 11/15/16  8:46 PM  Result Value Ref Range Status   Specimen Description URINE, CLEAN CATCH  Final   Special Requests NONE  Final   Culture MULTIPLE SPECIES PRESENT, SUGGEST RECOLLECTION (A)  Final   Report Status 11/17/2016 FINAL  Final     Medications:   . amLODipine  10 mg Oral Daily  . aspirin EC  81 mg Oral Daily  . atorvastatin  40 mg Oral q1800  . baclofen  10 mg Oral TID  . heparin  5,000 Units Subcutaneous Q8H  . metoprolol tartrate  100 mg Oral BID  . potassium chloride  10 mEq Oral Daily   Continuous Infusions: . cefTRIAXone (ROCEPHIN)  IV  Time spent: 35 min   LOS: 0 days   Marinda Elk  [facility] Pager [phone]  *Please refer to [email or web address], password [email or web address] to get updated schedule on who will round on this patient, as hospitalists switch teams weekly. If 7PM-7AM, please contact night-coverage at [email or web address], password [email or web address] for any overnight needs.  11/23/2016, 8:22 AM

## 2016-11-23 NOTE — Clinical Social Work Note (Signed)
Clinical Social Work Assessment  Patient Details  Name: Joel Solis MRN: [id number] Date of Birth: 06-22-1945  Date of referral:  11/23/16               Reason for consult:  Facility Placement, Housing Concerns/Homelessness, Family Concerns                Permission sought to share information with:  Family Supports Permission granted to share information::  Yes, Verbal Permission Granted  Name::     Joel Solis  Agency::     Relationship::  Daughter   Contact Information:     Housing/Transportation Living arrangements for the past 2 months:  Single Family Home Source of Information:  Patient, Adult Children Patient Interpreter Needed:  None Criminal Activity/Legal Involvement Pertinent to Current Situation/Hospitalization:  No - Comment as needed Significant Relationships:  Adult Children, Other(Comment) (Pt is currently set up with [employer]. ) Lives with:  Self Do you feel safe going back to the place where you live?  Yes Need for family participation in patient care:  (S) Yes (Comment)  Care giving concerns:  Pt is a 71 year old male who lives alone. Pt's daughter Joel Solis) presented concerns of pt being a bit confused at times and not able to take care of himself as ordered. Pt's daughter Joel Solis informed CSW that there are other concerns that are taking place, however didn't go into much detail about the situation.    Social Worker assessment / plan:  CSW spoke with pt at bedside to address his cause for being present in the ED. Pt informed CSW that pt lives alone and has a Child psychotherapist that comes to pt's home weekly. Pt went on to inform CSW that pt's social worker is the one that brought pt here. CSW asked pt if pt was agreeable to go to a SNf if needed. Pt informed CSW that pt wanted to go back home and be there. CSW offered pt HH, but was notified by pt that pt already is receiving HH with [facility] and was satisfied with that. CSW  spoke with pt's doctor to confirm this information. Doctor was notified that pt wants to return back home once medically stable for discharge.   Employment status:  Unemployed Health and safety inspector:  Medicare PT Recommendations:  Not assessed at this time Information / Referral to community resources:  Skilled Holiday representative, Other (Comment Required) (When speaking with pt, pt informed CSW that pt did not wnt to go to a SNF, but would like  to return home. )  Patient/Family's Response to care:  Pt is currently not wanting to go to a SNF. However pt's daughter, Joel Solis is suggested that this is the best option at this time. CSW spoke with pt and daughter together and informed daughter that pt has the capacity to make his own decisions therefore CSW or other hospital staff can not force pt to go if pt isn't agreeable.   Patient/Family's Understanding of and Emotional Response to Diagnosis, Current Treatment, and Prognosis:  CSW attempted to explain to pt's family that pt is not agreeable to SNF at this time and would rather return home since pt is already receiving HH. Pt's daughter informed CSW that she understood, but asked to speak with the doctor to express further concerns as to why pt is needing to be placed within a SNF as compared to going back home with [facility] at the time of discharge.   Emotional Assessment  Appearance:  Appears stated age Attitude/Demeanor/Rapport:  Other (Sarcastic) Affect (typically observed):  Adaptable, Calm Orientation:  Oriented to Self, Oriented to Place, Oriented to  Time, Oriented to Situation Alcohol / Substance use:  Not Applicable Psych involvement (Current and /or in the community):  No (Comment)  Discharge Needs  Concerns to be addressed:  Basic Needs, Home Safety Concerns, Medication Concerns Readmission within the last 30 days:  Yes Current discharge risk:  Lives alone Barriers to Discharge:  Family Issues (Pt wants to return home as opposed to being sent  to a SNF.)   Joel Solis, LCSWA 11/23/2016, 7:58 AM

## 2016-11-24 ENCOUNTER — Inpatient Hospital Stay (HOSPITAL_COMMUNITY): Payer: Medicare Other

## 2016-11-24 DIAGNOSIS — G9341 Metabolic encephalopathy: Secondary | ICD-10-CM

## 2016-11-24 DIAGNOSIS — Z591 Inadequate housing: Secondary | ICD-10-CM

## 2016-11-24 LAB — GLUCOSE, CAPILLARY: Glucose-Capillary: 76 mg/dL (ref 65–99)

## 2016-11-24 LAB — UREA NITROGEN, URINE: UREA NITROGEN UR: 615 mg/dL

## 2016-11-24 MED ORDER — LACTULOSE 10 GM/15ML PO SOLN: 20.0000 g | Freq: Every day | ORAL | 1 refills | Status: DC

## 2016-11-24 MED ORDER — POTASSIUM CHLORIDE CRYS ER 20 MEQ PO TBCR
40.0000 meq | EXTENDED_RELEASE_TABLET | Freq: Two times a day (BID) | ORAL | Status: DC
  Administered 2016-11-24: 40 meq via ORAL
  Filled 2016-11-24: qty 2

## 2016-11-24 MED ORDER — LACTULOSE 10 GM/15ML PO SOLN
20.0000 g | Freq: Every day | ORAL | 0 refills | Status: DC
Start: 2016-11-24 — End: 2016-11-24

## 2016-11-24 NOTE — Progress Notes (Addendum)
Per patient request. CSW met with patient at bedside. Patient reports his is agreeable to SNF placement. CSW inquired if patient can pay privately as he did not meet three qualifying stay of medicare to cover the cost at SNF. Patient requested CSW talk with his daughter about payment options. CSW spoke with patient daughter Raquel Sarna, she reports the patient and family cannot afford to pay for SNF.  Family is agreeable for patient to d/c home w/ homehealth.  RNCM notified.   Kathrin Greathouse, Latanya Presser, MSW Clinical Social Worker 5E and Psychiatric Service Line [phone] 11/24/2016  11:22 AM

## 2016-11-24 NOTE — Progress Notes (Signed)
Physical Therapy Treatment Patient Details Name: Joel Solis MRN: [id number] DOB: 1945-05-26 Today's Date: 11/24/2016    History of Present Illness Pt admitted through ED with AMS and with hx of CVA and residual L side deficits    PT Comments    Assisted OOB to amb a limited distance in hallway with hemi walker.  Very unsteady gait.   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  Restrictions Weight Bearing Restrictions: No    Mobility  Bed Mobility Overal bed mobility: Modified Independent             General bed mobility comments: used rail and momentum, no physical assist   Transfers Overall transfer level: Needs assistance Equipment used: Hemi-walker;None Transfers: Sit to/from [employer] Transfers Sit to Stand: Min assist;Min guard Stand pivot transfers: Min guard;Min assist       General transfer comment: utilized hemi-walker and R UE to steady self   Ambulation/Gait Ambulation/Gait assistance: Min assist;Mod assist Ambulation Distance (Feet): 42 Feet Assistive device: Hemi-walker Gait Pattern/deviations: Step-to pattern;Decreased step length - right;Decreased step length - left;Decreased stance time - left;Decreased stride length;Shuffle;Trunk flexed;Narrow base of support;Scissoring Gait velocity: decreased   General Gait Details: very ataxic unsteady choppy gait.  Difficulty correctly placing L LE forward without adducting too much near cross over.     Stairs            Wheelchair Mobility    Modified Rankin (Stroke Patients Only)       Balance                                            Cognition Arousal/Alertness: Awake/alert Behavior During Therapy: WFL for tasks  assessed/performed Overall Cognitive Status: Within Functional Limits for tasks assessed Area of Impairment: Safety/judgement                         Safety/Judgement: Decreased awareness of safety            Exercises      General Comments        Pertinent Vitals/Pain Pain Assessment: No/denies pain    Home Living                      Prior Function            PT Goals (current goals can now be found in the care plan section) Progress towards PT goals: Progressing toward goals    Frequency    Min 3X/week      PT Plan Current plan remains appropriate    Co-evaluation              AM-PAC PT "6 Clicks" Daily Activity  Outcome Measure  Difficulty turning over in bed (including adjusting bedclothes, sheets and blankets)?: Total Difficulty moving from lying on back to sitting on the side of the bed? : Total Difficulty sitting down on and standing up from a chair with arms (e.g., wheelchair, bedside commode, etc,.)?: Total Help needed moving to and from a bed to chair (including a wheelchair)?: A Lot Help needed walking in hospital room?: A Lot Help  needed climbing 3-5 steps with a railing? : Total 6 Click Score: 8    End of Session Equipment Utilized During Treatment: Gait belt Activity Tolerance: Patient tolerated treatment well Patient left: in chair;with call bell/phone within reach;with nursing/sitter in room Nurse Communication: Mobility status PT Visit Diagnosis: Other abnormalities of gait and mobility (R26.89)     Time: [id number] PT Time Calculation (min) (ACUTE ONLY): 25 min  Charges:  $Gait Training: 8-22 mins $Therapeutic Activity: 8-22 mins                    G Codes:       Felecia ShellingLori Anastasha Ortez  PTA [facility]  Acute  Rehab Pager      [phone]

## 2016-11-24 NOTE — Care Management Note (Signed)
Case Management Note  Patient Details  Name: Tonye PearsonDaniel Norrod MRN: [id number] Date of Birth: [birth year]  Subjective/Objective:     AKI, acute metabolic encephalopathy               Action/Plan: Discharge Planning: NCM spoke to pt and gave permission to speak to Collene Leydendtr, Emily Hollerbach # [phone]. Dtr states [facility], RN, OT and SW. He lives at home alone. Contacted [facility] rep for resumption of care with aide added. Pt has RW at home.   PCP [facility]   Expected Discharge Date:  11/24/16               Expected Discharge Plan:  Home w Home Health Services  In-House Referral:  Clinical Social Work  Discharge planning Services  CM Consult  Post Acute Care Choice:  Home Health, Resumption of Svcs/PTA Provider Choice offered to:  Patient  DME Arranged:  N/A DME Agency:  NA  HH Arranged:  RN, PT, OT, Nurse's Aide, Social Work [facility]  Status of Service:  Completed, signed off  If discussed at [employer] Length of [employer] Meetings, dates discussed:    Additional Comments:  Elliot CousinShavis, Aitan Rossbach Ellen, RN 11/24/2016, 11:47 AM

## 2016-11-24 NOTE — Discharge Summary (Signed)
Physician Discharge Summary  Joel Solis ZHY:[id number] DOB: [birth year] DOA: 11/22/2016  PCP: [facility]  Admit date: 11/22/2016 Discharge date: 11/24/2016  Admitted From: home Disposition:  Home  Recommendations for Outpatient Follow-up:  1. Follow up with PCP in 1-2 weeks 2. Please obtain BMP/CBC in one week 3. Patient will go to skilled nursing facility.   Home Health:NO Equipment/Devices:None  Discharge Condition:stable CODE STATUS:full Diet recommendation: Heart Healthy   Brief/Interim Summary: 71 year old with past nuchal history of essential hypertension, hemorrhagic stroke with residual left-sided weakness presents to the emergency room for confusion, social worker revisited at home found her covered pieces confuse unable to open his prescription bottle for lactulose.  Discharge Diagnoses:  AKI (acute kidney injury) (HCC) Likely due to vomiting and diarrhea in the setting of Lasix and an ace inhibitor use. He was started on IV fluid hydration and his creatinine returned to baseline. Urinalysis was done that showed less than 10,000 colonies of bacteria. So antibiotics were stopped.  Acute metabolic encephalopathy: Likely due acute renal failure in the setting of liver dysfunction, his ammonia level was not checked on admission. But after the rehydrating him in starting his lactulose his encephalopathy resolved.  Mild elevation of his alkaline phosphatase: Bilirubin and LFTs were within normal limits, abdominal ultrasound was done that showed significant fatty liver. It was discussed with the patient still we will need to start a diet.  History of hemorrhagic CVA with residual deficits: Continue aspirin and Lipitor.  Essential hypertension: no changes were made to his medication.  Unsafe living situation: Physical therapy evaluated the patient and recommended skilled nursing facility, social worker was consulted and the patient was debating  whether or not to go to skilled nursing facility but finally he agreed.  Diarrhea: Has remained afebrile with no leukocytosis likely due to the lactulose.  Dirty living conditions    Discharge Instructions  Discharge Instructions    Diet - low sodium heart healthy    Complete by:  As directed    Increase activity slowly    Complete by:  As directed      Allergies as of 11/24/2016      Reactions   Shellfish Allergy Nausea And Vomiting      Medication List    TAKE these medications   amLODipine 10 MG tablet Commonly known as:  NORVASC Take 10 mg by mouth daily.   aspirin EC 81 MG tablet Take 81 mg by mouth daily.   atorvastatin 40 MG tablet Commonly known as:  LIPITOR Take 40 mg by mouth daily.   baclofen 10 MG tablet Commonly known as:  LIORESAL Take 10 mg by mouth 3 (three) times daily.   furosemide 20 MG tablet Commonly known as:  LASIX Take 20 mg by mouth daily.   lactulose 10 GM/15ML solution Commonly known as:  CHRONULAC Take 30 mLs (20 g total) by mouth daily. What changed:  when to take this   lisinopril 20 MG tablet Commonly known as:  PRINIVIL,ZESTRIL Take 20 mg by mouth daily.   metoprolol tartrate 100 MG tablet Commonly known as:  LOPRESSOR Take 100 mg by mouth 2 (two) times daily.   potassium chloride 10 MEQ CR capsule Commonly known as:  MICRO-K Take 10 mEq by mouth daily.       Allergies  Allergen Reactions  . Shellfish Allergy Nausea And Vomiting    Consultations:  None   Procedures/Studies: Dg Chest 2 View  Result Date: 11/15/2016 CLINICAL DATA:  Mental status  change which is unusual for the patient. EXAM: CHEST  2 VIEW COMPARISON:  None in PACs FINDINGS: The lungs are adequately inflated and clear. The heart is top-normal in size. The pulmonary vascularity is normal. The mediastinum is normal in width. There is no pleural effusion. There is calcification in the wall of the aortic arch. The bony thorax is unremarkable.  IMPRESSION: Top-normal cardiac size without evidence of pulmonary edema. Thoracic aortic atherosclerosis. Electronically Signed   By: David  [place] M.D.   On: 11/15/2016 14:25   Ct Head Wo Contrast  Result Date: 11/15/2016 CLINICAL DATA:  Altered mental status, some left-sided weakness since prior stroke EXAM: CT HEAD WITHOUT CONTRAST TECHNIQUE: Contiguous axial images were obtained from the base of the skull through the vertex without intravenous contrast. COMPARISON:  None. FINDINGS: Brain: The ventricular system his min prominent as are the cortical sulci indicating diffuse atrophy. Ventriculostomy catheter enters from the right frontal region with the tip in the posterior aspect of the right lateral ventricle. The septum is midline in position. Moderate small vessel ischemic changes noted throughout the periventricular white matter. Vascular: No vascular abnormality is noted on this unenhanced study. Skull: On bone window images, no calvarial abnormality is seen. Sinuses/Orbits: Paranasal sinuses appear clear. Other: None. IMPRESSION: Atrophy and moderate small vessel ischemic change. VP shunt present. No acute intracranial abnormality. Electronically Signed   By: Dwyane Dee M.D.   On: 11/15/2016 14:55   Mr Brain Wo Contrast  Result Date: 11/16/2016 CLINICAL DATA:  Initial evaluation for acute encephalopathy. EXAM: MRI HEAD WITHOUT CONTRAST TECHNIQUE: Multiplanar, multiecho pulse sequences of the brain and surrounding structures were obtained without intravenous contrast. COMPARISON:  Prior CT from 11/17/2016. FINDINGS: Brain: Diffuse prominence of the CSF containing spaces compatible with cerebral atrophy, mildly advanced for age. Patchy and confluent T2/FLAIR hyperintensity within the periventricular and deep white matter both cerebral hemispheres most consistent with chronic small vessel ischemic disease, moderate nature. Encephalomalacia within the right thalamus/ basal ganglia, extending towards  the external capsule consistent with remote ischemic infarct. Associated chronic hemorrhagic blood products noted. Associated ex vacuo dilatation of the right lateral ventricle. Additional remote lacunar infarcts noted within the left thalamus. No abnormal foci of restricted diffusion to suggest acute or subacute ischemia. Gray-white matter differentiation otherwise maintained. No other evidence for remote infarction. Right frontal approach ventricular catheter in place with tip terminating in the posterior right lateral ventricle. 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 No other mass lesion. No hydrocephalus. No midline shift. No extra-axial fluid collection. Major dural sinuses are grossly patent. Pituitary gland suprasellar region within normal limits. Vascular: Abnormal flow void within the right ICA to the level of the terminus, which may be related to slow flow and/ or occlusion. Major intracranial vascular flow voids otherwise maintained. Skull and upper cervical spine: Craniocervical junction within normal limits. Visualized upper cervical spine unremarkable. Bone marrow signal intensity within normal limits. Septated artifact related to shunt catheter present at the right scalp. Scalp soft tissues demonstrate no acute abnormality. Sinuses/Orbits: Globes and orbital soft tissues within normal limits. Paranasal sinuses are clear. No mastoid effusion. Inner ear structures normal. IMPRESSION: 1. No acute intracranial infarct or other process identified. 2. Right frontal approach ventricular catheter in place as above. No hydrocephalus. 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 3. Remote hemorrhagic right thalamic/basal ganglia infarct, with additional remote left thalamic lacunar infarct.  4. Abnormal flow void within the right ICA to the level of the terminus, which may be related to slow flow  and/or occlusion. 5. Generalized cerebral atrophy with moderate chronic small vessel ischemic disease. Electronically Signed   By: Rise MuBenjamin  McClintock M.D.   On: 11/16/2016 20:30   [place] Abdomen Complete  Result Date: 11/24/2016 CLINICAL DATA:  Encephalopathy, hypertension, hyperlipidemia, clinical concern of cirrhosis. EXAM: ABDOMEN ULTRASOUND COMPLETE COMPARISON:  Renal ultrasound dated November 22, 2016. FINDINGS: Gallbladder: No gallstones or wall thickening visualized. No sonographic Murphy sign noted by sonographer. Common bile duct: Diameter: 3.1 mm where visualized. Liver: The hepatic echotexture is increased. There is no discrete mass or ductal dilation. The surface contour of the liver is normal. IVC: No abnormality visualized. Pancreas: Bowel gas limits evaluation of the pancreas Spleen: Size and appearance within normal limits. Right Kidney: Length: 10.3 cm. Echogenicity within normal limits. No mass or hydronephrosis visualized. Left Kidney: Length: 9.1 cm. Echogenicity within normal limits. No mass or hydronephrosis visualized. Abdominal aorta: The proximal abdominal aorta measures 2.9 cm. There is a normal tapering caliber. Other findings: No ascites is observed. IMPRESSION: Increased hepatic echotexture most compatible with fatty infiltrative change. No discrete mass, ductal dilation, or surface contour irregularity. There is no ascites or splenomegaly. No gallstones or sonographic evidence of acute cholecystitis. No acute intra-abdominal abnormality is observed. Electronically Signed   By: David  [place]azilandJordan M.D.   On: 11/24/2016 07:02   [place] Renal  Result Date: 11/22/2016 CLINICAL DATA:  71 year old male with acute kidney injury. EXAM: RENAL / URINARY TRACT ULTRASOUND COMPLETE COMPARISON:  None. FINDINGS: Right Kidney: Length: 10.3 cm with moderate cortical atrophy. Echogenicity within normal limits. No mass or hydronephrosis visualized. Left Kidney: Length: 11.1 cm with moderate cortical atrophy.  Echogenicity within normal limits. No mass or hydronephrosis visualized. Bladder: Appears normal for degree of bladder distention. IMPRESSION: Moderate bilateral renal cortical atrophy without other significant abnormality. No evidence of hydronephrosis. Electronically Signed   By: Harmon PierJeffrey  Hu M.D.   On: 11/22/2016 19:32    Subjective: No new complains.  Discharge Exam: Vitals:   11/23/16 1431 11/23/16 2114  BP: 126/77 132/72  Pulse: 65 62  Resp: 20 20  Temp: 98.1 F (36.7 C) 98.1 F (36.7 C)   Vitals:   11/22/16 2026 11/23/16 0454 11/23/16 1431 11/23/16 2114  BP: 137/69 (!) 141/82 126/77 132/72  Pulse: 79 64 65 62  Resp: (!) 22 20 20 20   Temp:  98.4 F (36.9 C) 98.1 F (36.7 C) 98.1 F (36.7 C)  TempSrc: Oral Oral Oral Oral  SpO2: 99% 98% 99% 99%  Height: 5\' 8"  (1.727 m)       General: Asian is awake alert and oriented no distress Cardiovascular: Regular rate and rhythm Respiratory: Good air movement and clear to auscultation. Abdominal: Positive bowel sounds, soft nontender nondistended Extremities: No edema or cyanosis.    The results of significant diagnostics from this hospitalization (including imaging, microbiology, ancillary and laboratory) are listed below for reference.     Microbiology: Recent Results (from the past 240 hour(s))  Urine Culture     Status: Abnormal   Collection Time: 11/15/16  8:46 PM  Result Value Ref Range Status   Specimen Description URINE, CLEAN CATCH  Final   Special Requests NONE  Final   Culture MULTIPLE SPECIES PRESENT, SUGGEST RECOLLECTION (A)  Final   Report Status 11/17/2016 FINAL  Final  Urine culture     Status: Abnormal   Collection Time: 11/22/16  1:36  PM  Result Value Ref Range Status   Specimen Description URINE, CLEAN CATCH  Final   Special Requests NONE  Final   Culture (A)  Final    <10,000 COLONIES/mL INSIGNIFICANT GROWTH Performed at [facility], [address], [place], [place] [address]    Report  Status 11/23/2016 FINAL  Final     Labs: BNP (last 3 results) No results for input(s): BNP in the last 8760 hours. Basic Metabolic Panel:  Recent Labs Lab 11/22/16 1431 11/23/16 0540  NA 141 145  K 3.6 3.0*  CL 111 109  CO2 24 27  GLUCOSE 92 88  BUN 20 16  CREATININE 1.64* 1.15  CALCIUM 8.5* 8.4*   Liver Function Tests:  Recent Labs Lab 11/22/16 1431  AST 18  ALT 18  ALKPHOS 137*  BILITOT 0.5  PROT 6.9  ALBUMIN 3.5   No results for input(s): LIPASE, AMYLASE in the last 168 hours.  Recent Labs Lab 11/22/16 1431  AMMONIA 15   CBC:  Recent Labs Lab 11/22/16 1431 11/23/16 0540  WBC 11.5* 8.6  NEUTROABS 7.3 4.0  HGB 15.8 15.3  HCT 47.4 46.4  MCV 83.9 84.7  PLT 322 291   Cardiac Enzymes: No results for input(s): CKTOTAL, CKMB, CKMBINDEX, TROPONINI in the last 168 hours. BNP: Invalid input(s): POCBNP CBG:  Recent Labs Lab 11/23/16 0745 11/24/16 0740  GLUCAP 73 76   D-Dimer No results for input(s): DDIMER in the last 72 hours. Hgb A1c No results for input(s): HGBA1C in the last 72 hours. Lipid Profile No results for input(s): CHOL, HDL, LDLCALC, TRIG, CHOLHDL, LDLDIRECT in the last 72 hours. Thyroid function studies No results for input(s): TSH, T4TOTAL, T3FREE, THYROIDAB in the last 72 hours.  Invalid input(s): FREET3 Anemia work up No results for input(s): VITAMINB12, FOLATE, FERRITIN, TIBC, IRON, RETICCTPCT in the last 72 hours. Urinalysis    Component Value Date/Time   COLORURINE AMBER (A) 11/22/2016 1336   APPEARANCEUR HAZY (A) 11/22/2016 1336   LABSPEC 1.021 11/22/2016 1336   PHURINE 5.0 11/22/2016 1336   GLUCOSEU NEGATIVE 11/22/2016 1336   HGBUR SMALL (A) 11/22/2016 1336   BILIRUBINUR NEGATIVE 11/22/2016 1336   KETONESUR NEGATIVE 11/22/2016 1336   PROTEINUR NEGATIVE 11/22/2016 1336   NITRITE NEGATIVE 11/22/2016 1336   LEUKOCYTESUR LARGE (A) 11/22/2016 1336   Sepsis Labs Invalid input(s): PROCALCITONIN,  WBC,   LACTICIDVEN Microbiology Recent Results (from the past 240 hour(s))  Urine Culture     Status: Abnormal   Collection Time: 11/15/16  8:46 PM  Result Value Ref Range Status   Specimen Description URINE, CLEAN CATCH  Final   Special Requests NONE  Final   Culture MULTIPLE SPECIES PRESENT, SUGGEST RECOLLECTION (A)  Final   Report Status 11/17/2016 FINAL  Final  Urine culture     Status: Abnormal   Collection Time: 11/22/16  1:36 PM  Result Value Ref Range Status   Specimen Description URINE, CLEAN CATCH  Final   Special Requests NONE  Final   Culture (A)  Final    <10,000 COLONIES/mL INSIGNIFICANT GROWTH Performed at [facility], [address] [place], [place] [address]    Report Status 11/23/2016 FINAL  Final     Time coordinating discharge: Over 30 minutes  SIGNED:   Marinda Elk, MD  [facility] 11/24/2016, 8:09 AM Pager   If 7PM-7AM, please contact night-coverage [email or web address] Password [email or web address]

## 2017-10-30 IMAGING — US US ABDOMEN COMPLETE
1 series · 13 of 25 positions shown · non-contrast
Comparison: Renal ultrasound dated November 22, 2016.

CLINICAL DATA: Encephalopathy, hypertension, hyperlipidemia,
clinical concern of cirrhosis.

EXAM:
ABDOMEN ULTRASOUND COMPLETE

[Series 1: us abdomen complete · 0.20mm/px · 13 of 70 slices shown]
[im 1/70]
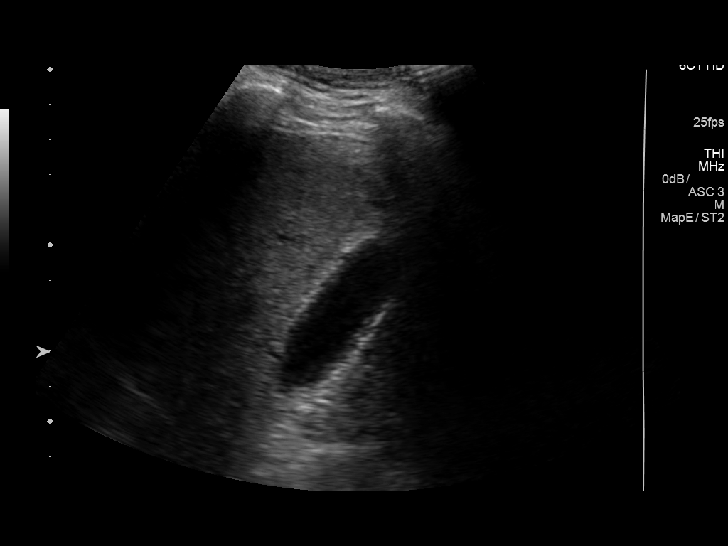
[im 6/70]
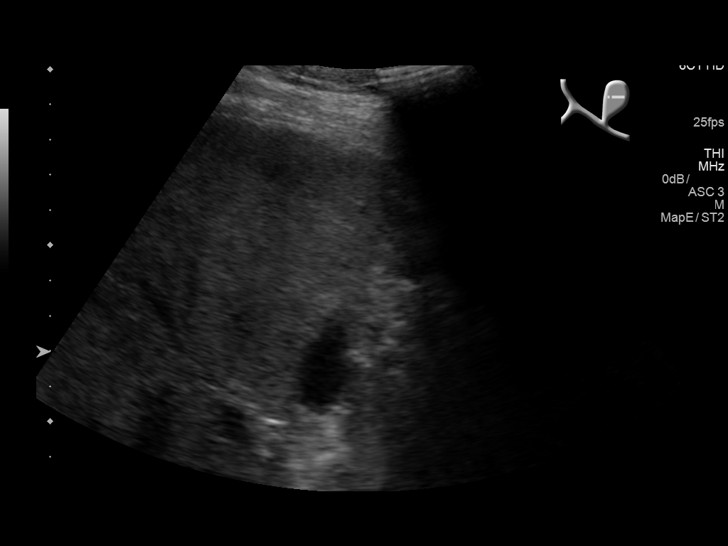
[im 12/70]
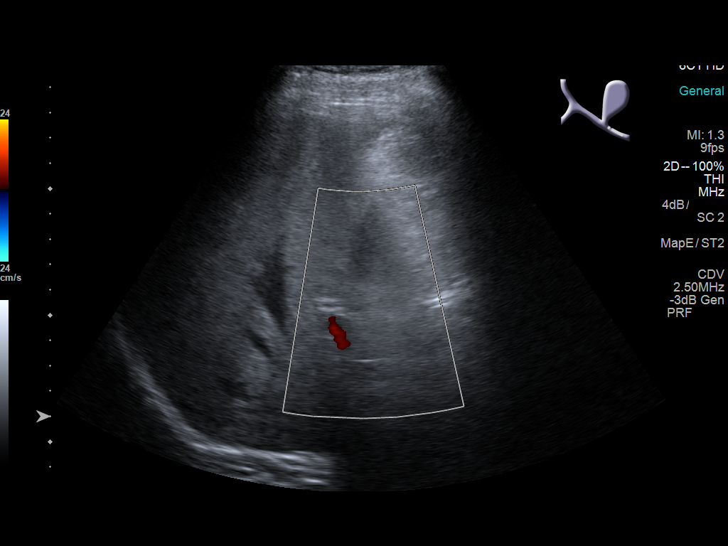
[im 18/70]
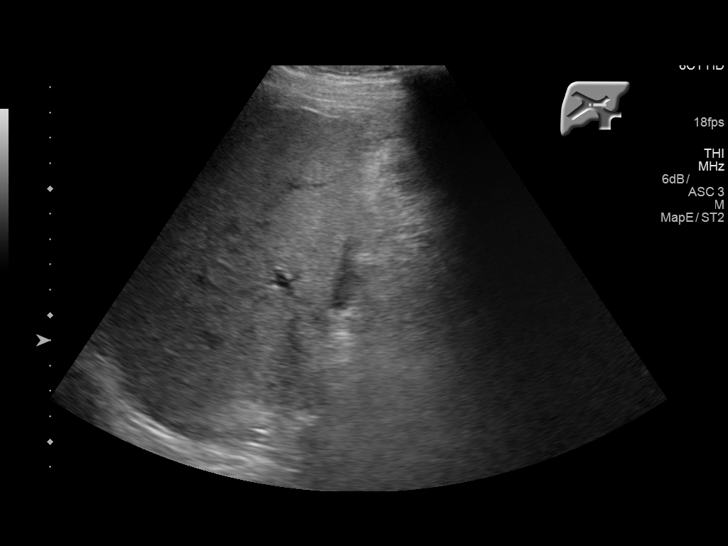
[im 24/70]
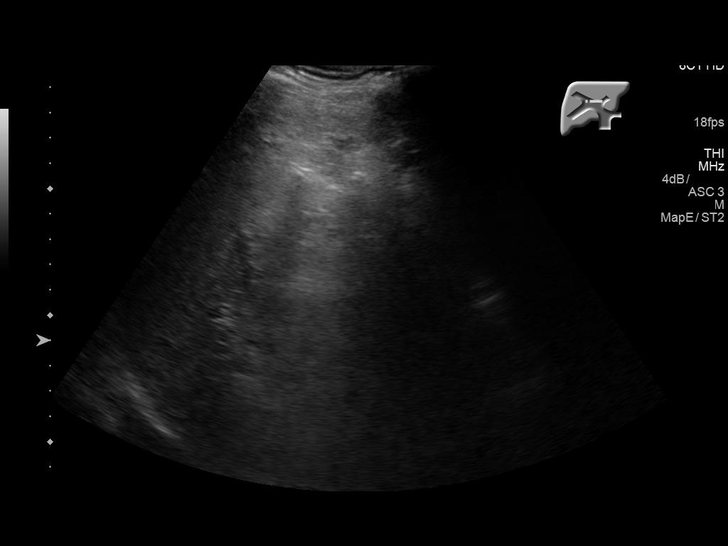
[im 29/70]
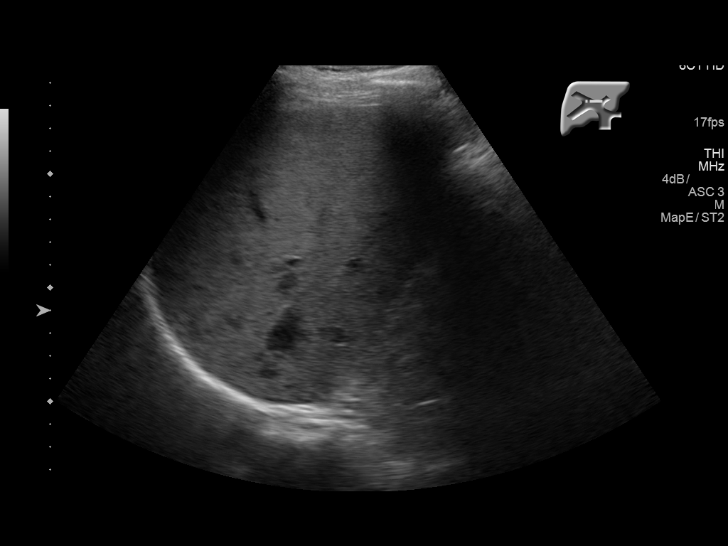
[im 35/70]
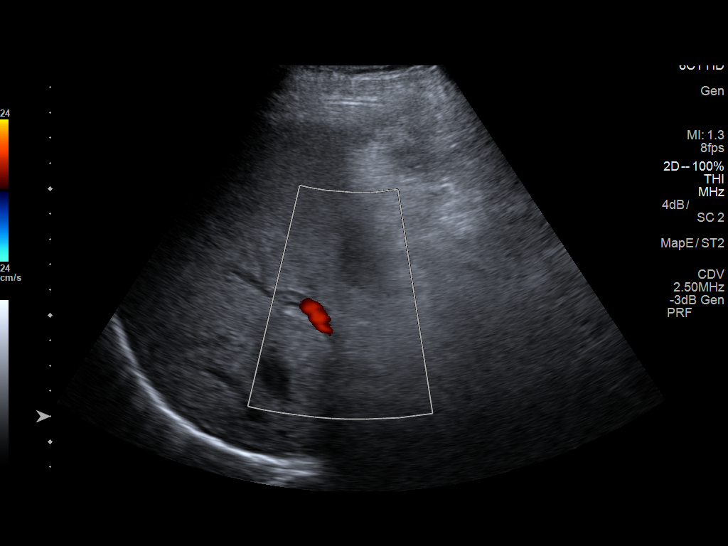
[im 41/70]
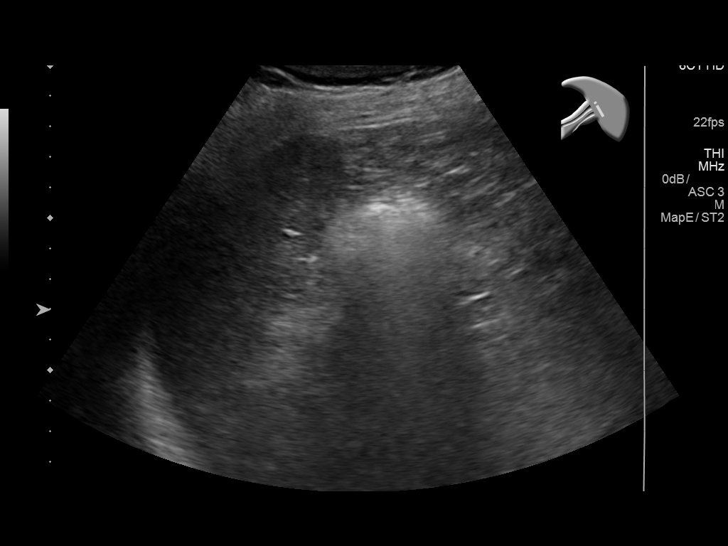
[im 47/70]
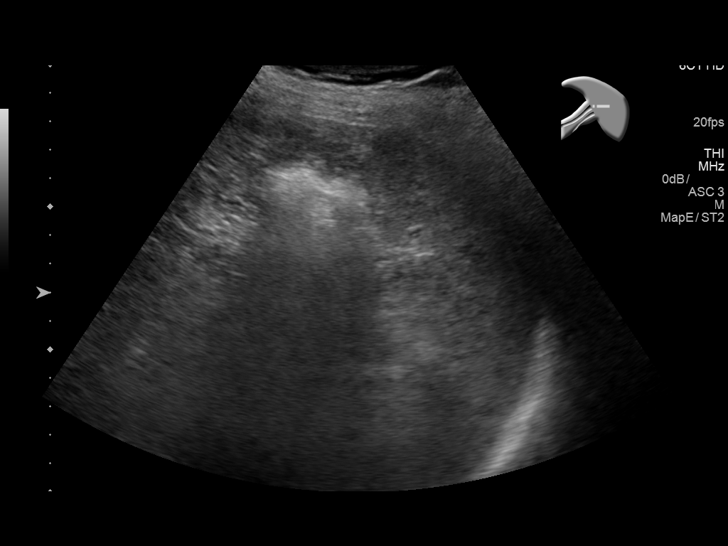
[im 52/70]
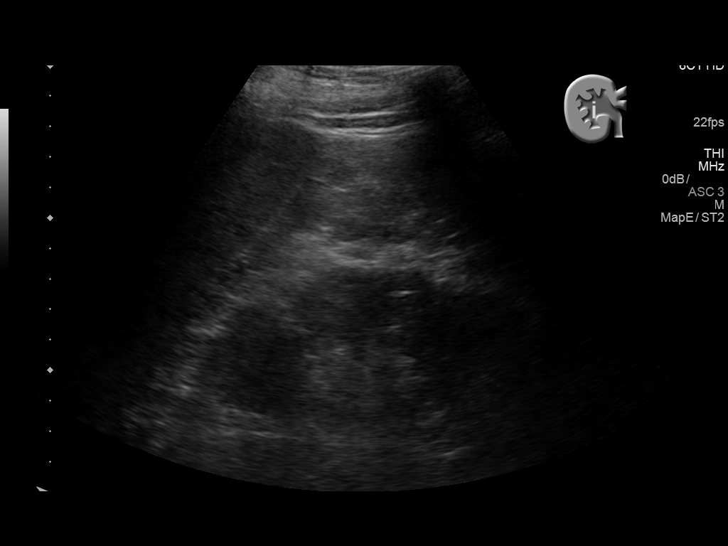
[im 58/70]
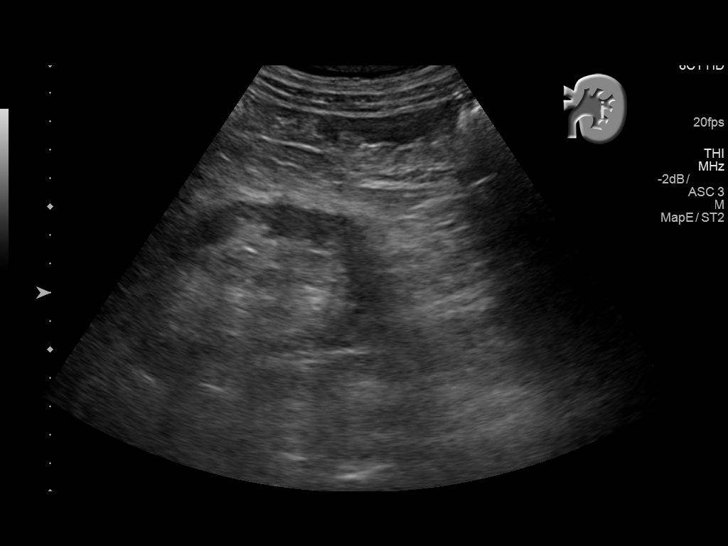
[im 64/70]
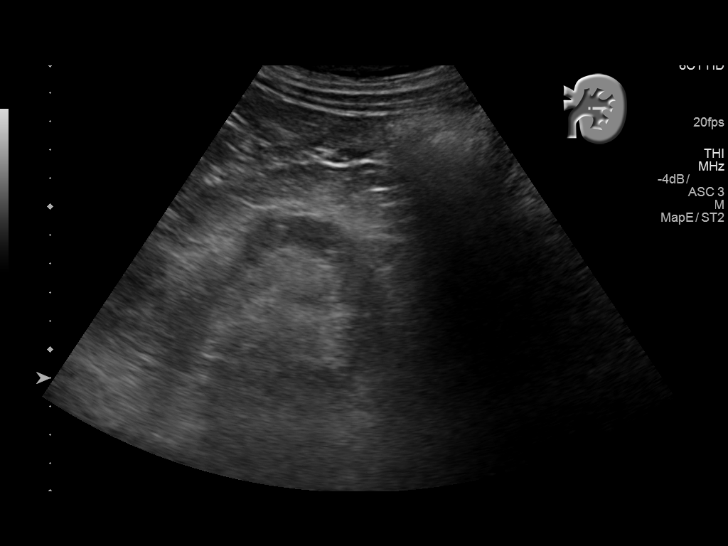
[im 70/70]
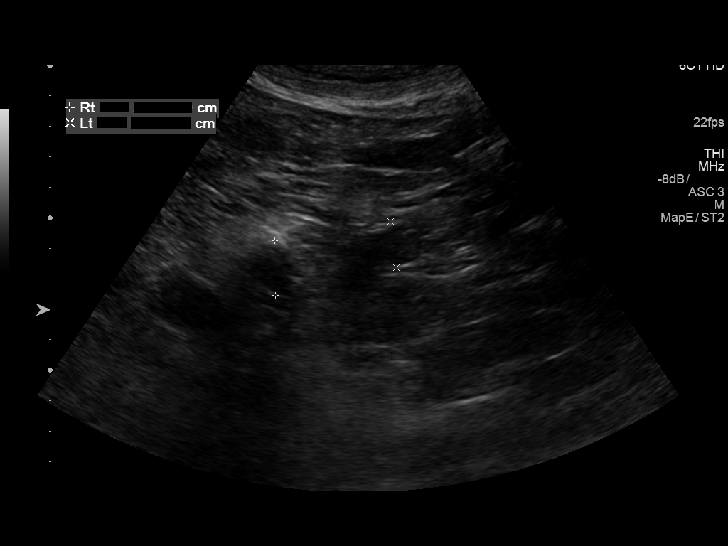

[13 of 25 positions shown; findings below may reference images not displayed]

FINDINGS: Gallbladder: No gallstones or wall thickening visualized. No
sonographic Murphy sign noted by sonographer.

Common bile duct: Diameter: 3.1 mm where visualized.

Liver: The hepatic echotexture is increased. There is no discrete
mass or ductal dilation. The surface contour of the liver is normal.

IVC: No abnormality visualized.

Pancreas: Bowel gas limits evaluation of the pancreas

Spleen: Size and appearance within normal limits.

Right Kidney: Length: 10.3 cm. Echogenicity within normal limits. No
mass or hydronephrosis visualized.

Left Kidney: Length: 9.1 cm. Echogenicity within normal limits. No
mass or hydronephrosis visualized.

Abdominal aorta: The proximal abdominal aorta measures 2.9 cm. There
is a normal tapering caliber.

Other findings: No ascites is observed.
IMPRESSION: Increased hepatic echotexture most compatible with fatty
infiltrative change. No discrete mass, ductal dilation, or surface
contour irregularity. There is no ascites or splenomegaly.

No gallstones or sonographic evidence of acute cholecystitis.

No acute intra-abdominal abnormality is observed.

## 2018-02-02 ENCOUNTER — Emergency Department (HOSPITAL_COMMUNITY): Payer: Medicare Other

## 2018-02-02 ENCOUNTER — Encounter (HOSPITAL_COMMUNITY): Payer: Self-pay

## 2018-02-02 ENCOUNTER — Other Ambulatory Visit: Payer: Self-pay

## 2018-02-02 ENCOUNTER — Inpatient Hospital Stay (HOSPITAL_COMMUNITY)
Admission: EM | Admit: 2018-02-02 | Discharge: 2018-02-08 | DRG: 872 | Disposition: A | Discharge status: Skilled Nursing Facility | Payer: Medicare Other | Attending: Internal Medicine | Admitting: Internal Medicine

## 2018-02-02 DIAGNOSIS — Z801 Family history of malignant neoplasm of trachea, bronchus and lung: Secondary | ICD-10-CM | POA: Diagnosis not present

## 2018-02-02 DIAGNOSIS — Z23 Encounter for immunization: Secondary | ICD-10-CM | POA: Diagnosis present

## 2018-02-02 DIAGNOSIS — Z79899 Other long term (current) drug therapy: Secondary | ICD-10-CM

## 2018-02-02 DIAGNOSIS — N179 Acute kidney failure, unspecified: Secondary | ICD-10-CM | POA: Diagnosis present

## 2018-02-02 DIAGNOSIS — Z91013 Allergy to seafood: Secondary | ICD-10-CM

## 2018-02-02 DIAGNOSIS — N182 Chronic kidney disease, stage 2 (mild): Secondary | ICD-10-CM | POA: Diagnosis present

## 2018-02-02 DIAGNOSIS — F17200 Nicotine dependence, unspecified, uncomplicated: Secondary | ICD-10-CM | POA: Diagnosis present

## 2018-02-02 DIAGNOSIS — E876 Hypokalemia: Secondary | ICD-10-CM | POA: Diagnosis present

## 2018-02-02 DIAGNOSIS — Z8249 Family history of ischemic heart disease and other diseases of the circulatory system: Secondary | ICD-10-CM | POA: Diagnosis not present

## 2018-02-02 DIAGNOSIS — K760 Fatty (change of) liver, not elsewhere classified: Secondary | ICD-10-CM | POA: Diagnosis present

## 2018-02-02 DIAGNOSIS — I69354 Hemiplegia and hemiparesis following cerebral infarction affecting left non-dominant side: Secondary | ICD-10-CM | POA: Diagnosis not present

## 2018-02-02 DIAGNOSIS — Z982 Presence of cerebrospinal fluid drainage device: Secondary | ICD-10-CM

## 2018-02-02 DIAGNOSIS — I129 Hypertensive chronic kidney disease with stage 1 through stage 4 chronic kidney disease, or unspecified chronic kidney disease: Secondary | ICD-10-CM | POA: Diagnosis present

## 2018-02-02 DIAGNOSIS — L089 Local infection of the skin and subcutaneous tissue, unspecified: Secondary | ICD-10-CM | POA: Diagnosis present

## 2018-02-02 DIAGNOSIS — L97829 Non-pressure chronic ulcer of other part of left lower leg with unspecified severity: Secondary | ICD-10-CM | POA: Diagnosis present

## 2018-02-02 DIAGNOSIS — Z800 Family history of malignant neoplasm of digestive organs: Secondary | ICD-10-CM

## 2018-02-02 DIAGNOSIS — I69393 Ataxia following cerebral infarction: Secondary | ICD-10-CM

## 2018-02-02 DIAGNOSIS — L03116 Cellulitis of left lower limb: Secondary | ICD-10-CM | POA: Diagnosis present

## 2018-02-02 DIAGNOSIS — E785 Hyperlipidemia, unspecified: Secondary | ICD-10-CM | POA: Diagnosis present

## 2018-02-02 DIAGNOSIS — Z993 Dependence on wheelchair: Secondary | ICD-10-CM | POA: Diagnosis not present

## 2018-02-02 DIAGNOSIS — E669 Obesity, unspecified: Secondary | ICD-10-CM | POA: Diagnosis present

## 2018-02-02 DIAGNOSIS — E7220 Disorder of urea cycle metabolism, unspecified: Secondary | ICD-10-CM | POA: Diagnosis present

## 2018-02-02 DIAGNOSIS — Z6829 Body mass index (BMI) 29.0-29.9, adult: Secondary | ICD-10-CM

## 2018-02-02 DIAGNOSIS — A419 Sepsis, unspecified organism: Secondary | ICD-10-CM | POA: Diagnosis not present

## 2018-02-02 DIAGNOSIS — S90934A Unspecified superficial injury of right lesser toe(s), initial encounter: Secondary | ICD-10-CM

## 2018-02-02 DIAGNOSIS — L0390 Cellulitis, unspecified: Secondary | ICD-10-CM | POA: Diagnosis present

## 2018-02-02 DIAGNOSIS — L03119 Cellulitis of unspecified part of limb: Secondary | ICD-10-CM | POA: Diagnosis not present

## 2018-02-02 LAB — COMPREHENSIVE METABOLIC PANEL
ALT: 12 U/L (ref 0–44)
ANION GAP: 9 (ref 5–15)
AST: 15 U/L (ref 15–41)
Albumin: 3.4 g/dL — ABNORMAL LOW (ref 3.5–5.0)
Alkaline Phosphatase: 142 U/L — ABNORMAL HIGH (ref 38–126)
BILIRUBIN TOTAL: 0.9 mg/dL (ref 0.3–1.2)
BUN: 31 mg/dL — ABNORMAL HIGH (ref 8–23)
CHLORIDE: 105 mmol/L (ref 98–111)
CO2: 28 mmol/L (ref 22–32)
Calcium: 8.7 mg/dL — ABNORMAL LOW (ref 8.9–10.3)
Creatinine, Ser: 1.6 mg/dL — ABNORMAL HIGH (ref 0.61–1.24)
GFR, EST AFRICAN AMERICAN: 48 mL/min — AB (ref >60)
GFR, EST NON AFRICAN AMERICAN: 41 mL/min — AB (ref >60)
Glucose, Bld: 89 mg/dL (ref 70–99)
POTASSIUM: 3.4 mmol/L — AB (ref 3.5–5.1)
Sodium: 142 mmol/L (ref 135–145)
TOTAL PROTEIN: 7.9 g/dL (ref 6.5–8.1)

## 2018-02-02 LAB — CBC WITH DIFFERENTIAL/PLATELET
BASOS ABS: 0.1 10*3/uL (ref 0.0–0.1)
BASOS PCT: 1 %
Eosinophils Absolute: 0.3 10*3/uL (ref 0.0–0.7)
Eosinophils Relative: 4 %
HEMATOCRIT: 48.4 % (ref 39.0–52.0)
Hemoglobin: 15.7 g/dL (ref 13.0–17.0)
LYMPHS PCT: 30 %
Lymphs Abs: 2.5 10*3/uL (ref 0.7–4.0)
MCH: 27.9 pg (ref 26.0–34.0)
MCHC: 32.4 g/dL (ref 30.0–36.0)
MCV: 86.1 fL (ref 78.0–100.0)
Monocytes Absolute: 0.7 10*3/uL (ref 0.1–1.0)
Monocytes Relative: 8 %
NEUTROS ABS: 4.8 10*3/uL (ref 1.7–7.7)
Neutrophils Relative %: 57 %
PLATELETS: 340 10*3/uL (ref 150–400)
RBC: 5.62 MIL/uL (ref 4.22–5.81)
RDW: 13.4 % (ref 11.5–15.5)
WBC: 8.4 10*3/uL (ref 4.0–10.5)

## 2018-02-02 LAB — MRSA PCR SCREENING: MRSA by PCR: NEGATIVE

## 2018-02-02 LAB — I-STAT CG4 LACTIC ACID, ED: LACTIC ACID, VENOUS: 1.33 mmol/L (ref 0.5–1.9)

## 2018-02-02 MED ORDER — METOPROLOL TARTRATE 50 MG PO TABS
100.0000 mg | ORAL_TABLET | Freq: Two times a day (BID) | ORAL | Status: DC
  Administered 2018-02-02 – 2018-02-08 (×12): 100 mg via ORAL
  Filled 2018-02-02 (×12): qty 2

## 2018-02-02 MED ORDER — ATORVASTATIN CALCIUM 40 MG PO TABS
40.0000 mg | ORAL_TABLET | Freq: Every day | ORAL | Status: DC
  Administered 2018-02-03 – 2018-02-08 (×6): 40 mg via ORAL
  Filled 2018-02-02 (×6): qty 1

## 2018-02-02 MED ORDER — SODIUM CHLORIDE 0.9 % IV BOLUS (SEPSIS)
1000.0000 mL | Freq: Once | INTRAVENOUS | Status: AC
  Administered 2018-02-02: 1000 mL via INTRAVENOUS

## 2018-02-02 MED ORDER — TRAMADOL HCL 50 MG PO TABS
50.0000 mg | ORAL_TABLET | Freq: Four times a day (QID) | ORAL | Status: DC
  Administered 2018-02-02 – 2018-02-07 (×17): 50 mg via ORAL
  Filled 2018-02-02 (×18): qty 1

## 2018-02-02 MED ORDER — BACLOFEN 10 MG PO TABS
10.0000 mg | ORAL_TABLET | Freq: Three times a day (TID) | ORAL | Status: DC
  Administered 2018-02-02 – 2018-02-08 (×18): 10 mg via ORAL
  Filled 2018-02-02 (×18): qty 1

## 2018-02-02 MED ORDER — AMLODIPINE BESYLATE 10 MG PO TABS
10.0000 mg | ORAL_TABLET | Freq: Every day | ORAL | Status: DC
  Administered 2018-02-03 – 2018-02-08 (×6): 10 mg via ORAL
  Filled 2018-02-02 (×6): qty 1

## 2018-02-02 MED ORDER — SODIUM CHLORIDE 0.9 % IV SOLN
2.0000 g | INTRAVENOUS | Status: DC
  Administered 2018-02-03 – 2018-02-04 (×2): 2 g via INTRAVENOUS
  Filled 2018-02-02 (×3): qty 2

## 2018-02-02 MED ORDER — VANCOMYCIN HCL IN DEXTROSE 1-5 GM/200ML-% IV SOLN: 1000.0000 mg | Freq: Once | INTRAVENOUS | Status: DC

## 2018-02-02 MED ORDER — INFLUENZA VAC SPLIT HIGH-DOSE 0.5 ML IM SUSY
0.5000 mL | PREFILLED_SYRINGE | INTRAMUSCULAR | Status: AC
  Administered 2018-02-03: 0.5 mL via INTRAMUSCULAR
  Filled 2018-02-02: qty 0.5

## 2018-02-02 MED ORDER — ENOXAPARIN SODIUM 40 MG/0.4ML ~~LOC~~ SOLN
40.0000 mg | SUBCUTANEOUS | Status: DC
  Administered 2018-02-03 – 2018-02-07 (×5): 40 mg via SUBCUTANEOUS
  Filled 2018-02-02 (×5): qty 0.4

## 2018-02-02 MED ORDER — HYDROCODONE-ACETAMINOPHEN 5-325 MG PO TABS
1.0000 | ORAL_TABLET | Freq: Once | ORAL | Status: AC
  Administered 2018-02-02: 1 via ORAL
  Filled 2018-02-02: qty 1

## 2018-02-02 MED ORDER — ATORVASTATIN CALCIUM 40 MG PO TABS: 40.0000 mg | ORAL_TABLET | Freq: Every day | ORAL | Status: DC

## 2018-02-02 MED ORDER — SODIUM CHLORIDE 0.9 % IV SOLN
2.0000 g | Freq: Once | INTRAVENOUS | Status: AC
  Administered 2018-02-02: 2 g via INTRAVENOUS
  Filled 2018-02-02: qty 2

## 2018-02-02 MED ORDER — LACTULOSE 10 GM/15ML PO SOLN
20.0000 g | Freq: Every day | ORAL | Status: DC
  Administered 2018-02-03: 20 g via ORAL
  Administered 2018-02-04: 10 g via ORAL
  Administered 2018-02-05: 20 g via ORAL
  Administered 2018-02-06: 10 g via ORAL
  Filled 2018-02-02 (×5): qty 30

## 2018-02-02 MED ORDER — IBUPROFEN 800 MG PO TABS: 800.0000 mg | ORAL_TABLET | Freq: Four times a day (QID) | ORAL | Status: DC

## 2018-02-02 MED ORDER — METOPROLOL TARTRATE 25 MG PO TABS: 100.0000 mg | ORAL_TABLET | Freq: Two times a day (BID) | ORAL | Status: DC

## 2018-02-02 MED ORDER — SODIUM CHLORIDE 0.9 % IV SOLN
INTRAVENOUS | Status: DC
  Administered 2018-02-02 – 2018-02-05 (×5): via INTRAVENOUS

## 2018-02-02 MED ORDER — HYDROCODONE-ACETAMINOPHEN 5-325 MG PO TABS
1.0000 | ORAL_TABLET | Freq: Four times a day (QID) | ORAL | Status: DC | PRN
  Administered 2018-02-02 – 2018-02-03 (×2): 1 via ORAL
  Administered 2018-02-03 – 2018-02-04 (×2): 2 via ORAL
  Filled 2018-02-02: qty 1
  Filled 2018-02-02 (×2): qty 2
  Filled 2018-02-02: qty 1

## 2018-02-02 MED ORDER — VANCOMYCIN HCL 10 G IV SOLR
1750.0000 mg | Freq: Once | INTRAVENOUS | Status: AC
  Administered 2018-02-02: 1750 mg via INTRAVENOUS
  Filled 2018-02-02: qty 1750

## 2018-02-02 MED ORDER — AMLODIPINE BESYLATE 5 MG PO TABS: 10.0000 mg | ORAL_TABLET | Freq: Every day | ORAL | Status: DC

## 2018-02-02 MED ORDER — METRONIDAZOLE IN NACL 5-0.79 MG/ML-% IV SOLN
500.0000 mg | Freq: Three times a day (TID) | INTRAVENOUS | Status: DC
  Administered 2018-02-02 – 2018-02-06 (×13): 500 mg via INTRAVENOUS
  Filled 2018-02-02 (×13): qty 100

## 2018-02-02 NOTE — ED Notes (Signed)
Patient transported to X-ray 

## 2018-02-02 NOTE — ED Notes (Signed)
Bed: [id number] Expected date:  Expected time:  Means of arrival:  Comments: Hold EMS-leg infection

## 2018-02-02 NOTE — Progress Notes (Signed)
A consult was received from an ED physician for vancomycin and cefepime per pharmacy dosing.  The patient's profile has been reviewed for ht/wt/allergies/indication/available labs.    A one time order has been placed for cefepime 2 gm and vancomycin 1750 mg IV x1.  Further antibiotics/pharmacy consults should be ordered by admitting physician if indicated.                       Thank you, Lucia Gaskinsham, Dametri Ozburn P 02/02/2018  1:02 PM

## 2018-02-02 NOTE — H&P (Addendum)
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 He has had ulcers on the bottom in the past as well He ambulates around motorized wheelchair 2015 because of his hemorrhagic stroke  Last admitted to the hospital 11/24/2016 with confusion and metabolic encephalopathy from renal dysfunction Use  Comes back with lower extremity swelling and cellulitic changes maggots in the wound as per report   ED Course: Patient started on cefepime Flagyl and vancomycin given IV fluids and pain medication at my request general surgery involved  Review of Systems:   Negative for fever, visual changes, sore throat, rash, new muscle aches, chest pain, SOB, dysuria, bleeding, n/v/abdominal pain.  Past Medical History:  Diagnosis Date  . Essential hypertension   . Hemorrhagic stroke (HCC)   . HLD (hyperlipidemia)   . S/P VP shunt   . Tobacco abuse     Past Surgical History:  Procedure Laterality Date  . cyst      cyst in neck  . VENTRICULOPERITONEAL SHUNT       reports that he has been smoking. He has never used smokeless tobacco. He reports that he drinks alcohol. He reports that he does not use drugs. Mobility: None he is dependent because of his VP shunt  Allergies  Allergen Reactions  . Shellfish Allergy Nausea And Vomiting    Family History  Problem Relation Age of Onset  . Intracerebral hemorrhage Mother   . Lung cancer Father   . Esophageal cancer Brother      Prior to Admission medications   Medication Sig Start Date End Date Taking?  Authorizing Provider  amLODipine (NORVASC) 10 MG tablet Take 10 mg by mouth daily.   Yes [provider]  atorvastatin (LIPITOR) 40 MG tablet Take 40 mg by mouth daily.   Yes [provider]  baclofen (LIORESAL) 10 MG tablet Take 10 mg by mouth 3 (three) times daily.   Yes [provider]  furosemide (LASIX) 20 MG tablet Take 20 mg by mouth daily.    Yes [provider]  ibuprofen (ADVIL,MOTRIN) 200 MG tablet Take 600 mg by mouth every 6 (six) hours as needed for mild pain.   Yes [provider]  Liniments (SALONPAS ARTHRITIS PAIN RELIEF EX) Apply 1 patch topically daily as needed (leg pain).   Yes [provider]  lisinopril (PRINIVIL,ZESTRIL) 20 MG tablet Take 20 mg by mouth daily.   Yes [provider]  metoprolol tartrate (LOPRESSOR) 100 MG tablet Take 100 mg by mouth 2 (two) times daily.   Yes [provider]  lactulose (CHRONULAC) 10 GM/15ML solution Take 30 mLs (20 g total) by mouth daily. Patient not taking: Reported on 02/02/2018 11/24/16   Briscoe Deutscher, MD    Physical Exam:  Vitals:   02/02/18 1124  BP: 132/79  Pulse: 63  Resp: 18  Temp: 97.9 F (36.6 C)  SpO2: 97%     Awake alert pleasant no distress  EOMI no icterus no pallor  added sound neck Mallampati 3 moderate dentition  Unkempt habitus-smell of urine  chest is clinically clear without  abd soft nt nd no rebound no guard  Neuro intact wihtout focal deficit  Skin changes as per EDP pictures noted below  I have personally reviewed following labs and imaging studies  Labs:   BUN/creatinine 31/1.6 lactic acid 1.3 LFTs within normal limits potassium 3.4  Hemoglobin 15 WBC 8  Imaging studies:   X-rays of lower extremity not concerning for osteomyelitis  Medical tests:   EKG independently reviewed: none    Test discussed with performing physician:  non   Decision to obtain old records:   y   Review and summation of old  records:   y   Active Problems:   Lower extremity cellulitis  Sepsis secondary to lower extremity cellulitis with maggots-wound care and general surgery probably need to be involved continue antibiotics, monitor, saline, needs debridement possibly surgical versus enzymatic-defer to general surgery who have been consulted by the ED-we will need outpatient wound care and placement PT to be involved  6:07 PM Addend--wound nurse to see the patient as general surgery defers to them at this juncture-note that the cultures were superficial culture is not deep cultures so we will probably antibiotics to gram-negative coverage with cefepime in addition to anaerobic coverage with Flagyl only and hold vancomycin going forward   Complicated cancer history in a patient who comes in with abnormal labs-patient was seen by chronic pain etc. etc. and I would suggest you do not-note reviewed it looks like surgery elected not to see the patient as they felt  AKI-saline as above-Baseline creatinine is 1.1 currently 31/1 would discontinue Advil that he was taking at home--- tramadol  Intraparenchymal hemorrhagic stroke in the past with CVA-deficits are not severe however patient is wheelchair-bound at baseline and motorized wheelchair  Metoprolol 100-I have held ACE and Lasix given AKI  Prior metabolic encephalopathy admissions-also has a history of steatosis probably related to heavy EtOH, [place]-continue lactulose 20 daily  Prior smoker    Severity of Illness: The appropriate patient status for this patient is INPATIENT. Inpatient status is judged to be reasonable and necessary in order to provide the required intensity of service to ensure the patient's safety. The patient's presenting symptoms, physical exam findings, and initial radiographic and laboratory data in the context of their chronic comorbidities is felt to place them at high risk for further clinical deterioration. Furthermore, it is not  anticipated that the patient will be medically stable for discharge from the hospital within 2 midnights of admission. The following factors support the patient status of inpatient.   " The patient's presenting symptoms include infection. " The worrisome physical exam findings include none. " The initial radiographic and laboratory data are worrisome because of maggots, cellutlisi. " The chronic co-morbidities include multiple.   * I certify that at the point of admission it is my clinical judgment that the patient will require inpatient hospital care spanning beyond 2 midnights from the point of admission due to high intensity of service, high risk for further deterioration and high frequency of surveillance required.*   Time spent: 25 minutes  Mahala MenghiniSamtani, MD  [facility] Direct contact: [phone] --Via amion app OR  --[email or web address]; password [email or web address]  7PM-7AM contact night coverage as above  02/02/2018, 3:29 PM

## 2018-02-02 NOTE — Care Management Note (Signed)
Case Management Note  CM noted pt was active with [facility].  Contacted Kathlene NovemberMike who will follow pt for needs.  Elfrieda Espino, Lynnae SandhoffAngela N, RN 02/02/2018, 2:53 PM

## 2018-02-02 NOTE — ED Notes (Signed)
ED TO INPATIENT HANDOFF REPORT  Name/Age/Gender Joel Solis 72 y.o. male  Code Status Code Status History    Date Active Date Inactive Code Status Order ID Comments User Context   11/22/2016 1918 11/24/2016 2247 Full Code [id number]  Vianne Bulls, MD ED   11/15/2016 2029 11/17/2016 1700 Full Code [id number]  Ivor Costa, MD ED      Home/SNF/Other Home  Chief Complaint leg infection  Level of Care/Admitting Diagnosis ED Disposition    ED Disposition Condition [facility] Area: [facility] [[id number]]  Level of Care: Med-Surg [16]  Diagnosis: Lower extremity cellulitis [341962]  Admitting Physician: Nita Sells [[id number]]  Attending Physician: Nita Sells [phone]  Estimated length of stay: 3 - 4 days  Certification:: I certify this patient will need inpatient services for at least 2 midnights  PT Class (Do Not Modify): Inpatient [101]  PT Acc Code (Do Not Modify): Private [1]       Medical History Past Medical History:  Diagnosis Date  . Essential hypertension   . Hemorrhagic stroke ([place])   . HLD (hyperlipidemia)   . S/P VP shunt   . Tobacco abuse     Allergies Allergies  Allergen Reactions  . Shellfish Allergy Nausea And Vomiting    IV Location/Drains/Wounds Patient Lines/Drains/Airways Status   Active Line/Drains/Airways    Name:   Placement date:   Placement time:   Site:   Days:   Peripheral IV 11/22/16 Right Hand   11/22/16    1247    Hand   437   Peripheral IV 02/02/18 Right Forearm   02/02/18    1137    Forearm   less than 1   Peripheral IV 02/02/18 Right Forearm   02/02/18    1323    Forearm   less than 1          Labs/Imaging Results for orders placed or performed during the hospital encounter of 02/02/18 (from the past 48 hour(s))  Comprehensive metabolic panel     Status: Abnormal   Collection Time: 02/02/18 11:36 AM  Result Value Ref Range   Sodium 142 135 - 145 mmol/L   Potassium 3.4 (L) 3.5 - 5.1  mmol/L   Chloride 105 98 - 111 mmol/L   CO2 28 22 - 32 mmol/L   Glucose, Bld 89 70 - 99 mg/dL   BUN 31 (H) 8 - 23 mg/dL   Creatinine, Ser 1.60 (H) 0.61 - 1.24 mg/dL   Calcium 8.7 (L) 8.9 - 10.3 mg/dL   Total Protein 7.9 6.5 - 8.1 g/dL   Albumin 3.4 (L) 3.5 - 5.0 g/dL   AST 15 15 - 41 U/L   ALT 12 0 - 44 U/L   Alkaline Phosphatase 142 (H) 38 - 126 U/L   Total Bilirubin 0.9 0.3 - 1.2 mg/dL   GFR calc non Af Amer 41 (L) >60 mL/min   GFR calc Af Amer 48 (L) >60 mL/min    Comment: (NOTE) The eGFR has been calculated using the CKD EPI equation. This calculation has not been validated in all clinical situations. eGFR's persistently <60 mL/min signify possible Chronic Kidney Disease.    Anion gap 9 5 - 15    Comment: Performed at [facility], [place] [address], [place], [place] [address]  CBC with Differential     Status: None   Collection Time: 02/02/18 11:36 AM  Result Value Ref Range   WBC 8.4 4.0 - 10.5 K/uL  RBC 5.62 4.22 - 5.81 MIL/uL   Hemoglobin 15.7 13.0 - 17.0 g/dL   HCT 48.4 39.0 - 52.0 %   MCV 86.1 78.0 - 100.0 fL   MCH 27.9 26.0 - 34.0 pg   MCHC 32.4 30.0 - 36.0 g/dL   RDW 13.4 11.5 - 15.5 %   Platelets 340 150 - 400 K/uL   Neutrophils Relative % 57 %   Neutro Abs 4.8 1.7 - 7.7 K/uL   Lymphocytes Relative 30 %   Lymphs Abs 2.5 0.7 - 4.0 K/uL   Monocytes Relative 8 %   Monocytes Absolute 0.7 0.1 - 1.0 K/uL   Eosinophils Relative 4 %   Eosinophils Absolute 0.3 0.0 - 0.7 K/uL   Basophils Relative 1 %   Basophils Absolute 0.1 0.0 - 0.1 K/uL    Comment: Performed at [facility], [place] [address] [place], [place] [address]  I-Stat CG4 Lactic Acid, ED     Status: None   Collection Time: 02/02/18 11:43 AM  Result Value Ref Range   Lactic Acid, Venous 1.33 0.5 - 1.9 mmol/L   Dg Tibia/fibula Left  Result Date: 02/02/2018 CLINICAL DATA:  Left leg cellulitis and pain. EXAM: LEFT TIBIA AND FIBULA - 2 VIEW COMPARISON:  None. FINDINGS:  Diffuse soft tissue swelling. No bone destruction or periosteal reaction. No soft tissue gas. Diffuse osteopenia. Mild atheromatous arterial calcifications. IMPRESSION: Diffuse soft tissue swelling without radiographic evidence of underlying osteomyelitis. Electronically Signed   By: Claudie Revering M.D.   On: 02/02/2018 13:58   Dg Foot Complete Left  Result Date: 02/02/2018 CLINICAL DATA:  Left leg and foot cellulitis. EXAM: LEFT FOOT - COMPLETE 3+ VIEW COMPARISON:  None. FINDINGS: Diffuse soft tissue swelling and diffuse osteopenia. No soft tissue gas, bone destruction or periosteal reaction. IMPRESSION: Diffuse soft tissue swelling and diffuse osteopenia without radiographic evidence of underlying osteomyelitis. Electronically Signed   By: Claudie Revering M.D.   On: 02/02/2018 13:59    Pending Labs Unresulted Labs (From admission, onward)    Start     Ordered   02/02/18 1252  Culture, blood (routine x 2)  BLOOD CULTURE X 2,   STAT     02/02/18 1251          Vitals/Pain Today's Vitals   02/02/18 1127 02/02/18 1315 02/02/18 1414 02/02/18 1430  BP:   (!) 144/81   Pulse:   66   Resp:   17   Temp:      TempSrc:      SpO2:   98%   Weight:  88.5 kg    Height:  5' 8"  (1.727 m)    PainSc: 6   10-Worst pain ever 8     Isolation Precautions No active isolations  Medications Medications  metroNIDAZOLE (FLAGYL) IVPB 500 mg (500 mg Intravenous New Bag/Given 02/02/18 1351)  sodium chloride 0.9 % bolus 1,000 mL (1,000 mLs Intravenous New Bag/Given 02/02/18 1349)    And  sodium chloride 0.9 % bolus 1,000 mL (1,000 mLs Intravenous New Bag/Given 02/02/18 1348)    And  sodium chloride 0.9 % bolus 1,000 mL (has no administration in time range)  vancomycin (VANCOCIN) 1,750 mg in sodium chloride 0.9 % 500 mL IVPB (1,750 mg Intravenous New Bag/Given 02/02/18 1432)  ibuprofen (ADVIL,MOTRIN) tablet 800 mg (has no administration in time range)  ceFEPIme (MAXIPIME) 2 g in sodium chloride 0.9 % 100 mL IVPB  (0 g Intravenous Stopped 02/02/18 1422)  HYDROcodone-acetaminophen (NORCO/VICODIN) 5-325 MG per tablet  1 tablet (1 tablet Oral Given 02/02/18 1326)    Mobility non-ambulatory

## 2018-02-02 NOTE — ED Triage Notes (Signed)
Per EMS: Pt is coming from home. Pt has a wound on his left leg that has a culture taken by a home health nurse and the pt reports he was called today and told he needed to be admitted to the hospital.  Pt is alert and oriented. Pt could not remember what the results were of the swab.

## 2018-02-02 NOTE — ED Provider Notes (Signed)
[facility]-EMERGENCY DEPT Provider Note   CSN: [id number] Arrival date & time: 02/02/18  1118     History   Chief Complaint Chief Complaint  Patient presents with  . Leg Pain  . Wound Infection    HPI Joel Solis is a 72 y.o. male with past medical history of hypertension, status post VP shunt, hemorrhagic stroke, presenting to the ED with wound to left leg.  He states his home health nurse has been caring for this wound to his left leg that he believes began about 1 month ago.  He states leg is significantly painful worse with palpation.  Has been taking ibuprofen for pain.  Reports his nurse called him today and told him the wound culture came back positive so he needed to report to the ED for admission.  He is unable to recall what the culture resulted as.  He denies fever, chills, or systemic symptoms.  Denies history of diabetes.   Per chart review, patient left lower leg wound positive for Pseudomonas.   The history is provided by the patient and medical records.    Past Medical History:  Diagnosis Date  . Essential hypertension   . Hemorrhagic stroke (HCC)   . HLD (hyperlipidemia)   . S/P VP shunt   . Tobacco abuse     Patient Active Problem List   Diagnosis Date Noted  . Lower extremity cellulitis 02/02/2018  . Infected superficial injury of toe of right foot 02/02/2018  . AKI (acute kidney injury) (HCC) 11/22/2016  . Inneffective medication administration routine at home 11/22/2016  . Dirty living conditions 11/22/2016  . Elevated troponin 11/16/2016  . Acute metabolic encephalopathy 11/15/2016  . Hypokalemia 11/15/2016  . Essential hypertension   . HLD (hyperlipidemia)   . 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 S/P VP shunt   . Tobacco abuse     Past Surgical History:  Procedure Laterality Date  . cyst      cyst in neck  . VENTRICULOPERITONEAL SHUNT          Home Medications     Prior to Admission medications   Medication Sig Start Date End Date Taking? Authorizing Provider  amLODipine (NORVASC) 10 MG tablet Take 10 mg by mouth daily.   Yes [provider]  atorvastatin (LIPITOR) 40 MG tablet Take 40 mg by mouth daily.   Yes [provider]  baclofen (LIORESAL) 10 MG tablet Take 10 mg by mouth 3 (three) times daily.   Yes [provider]  furosemide (LASIX) 20 MG tablet Take 20 mg by mouth daily.    Yes [provider]  ibuprofen (ADVIL,MOTRIN) 200 MG tablet Take 600 mg by mouth every 6 (six) hours as needed for mild pain.   Yes [provider]  Liniments (SALONPAS ARTHRITIS PAIN RELIEF EX) Apply 1 patch topically daily as needed (leg pain).   Yes [provider]  lisinopril (PRINIVIL,ZESTRIL) 20 MG tablet Take 20 mg by mouth daily.   Yes [provider]  metoprolol tartrate (LOPRESSOR) 100 MG tablet Take 100 mg by mouth 2 (two) times daily.   Yes [provider]  lactulose (CHRONULAC) 10 GM/15ML solution Take 30 mLs (20 g total) by mouth daily. Patient not taking: Reported on 02/02/2018 11/24/16   OpydLavone Neri, Timothy S, MD    Family History Family History  Problem Relation Age of Onset  . Intracerebral hemorrhage Mother   . Lung cancer Father   .  Esophageal cancer Brother     Social History Social History   Tobacco Use  . Smoking status: Current Every Day Smoker  . Smokeless tobacco: Never Used  Substance Use Topics  . Alcohol use: Yes    Comment: not drank in 10 months  . Drug use: No     Allergies   Shellfish allergy   Review of Systems Review of Systems  Constitutional: Negative for chills and fever.  Musculoskeletal: Positive for myalgias.  Skin: Positive for color change and wound.  Allergic/Immunologic: Negative for immunocompromised state.  All other systems reviewed and are negative.    Physical Exam Updated Vital Signs BP (!) 144/81 (BP Location: Right Arm)    Pulse 66   Temp 97.9 F (36.6 C) (Oral)   Resp 17   Ht 5\' 8"  (1.727 m)   Wt 88.5 kg   SpO2 98%   BMI 29.65 kg/m   Physical Exam  Constitutional: He appears well-developed and well-nourished. No distress.  HENT:  Head: Normocephalic and atraumatic.  Eyes: Conjunctivae are normal.  Cardiovascular: Normal rate and regular rhythm.  Pulmonary/Chest: Effort normal and breath sounds normal.  Abdominal: Soft.  Musculoskeletal:  Dirty bandage removed from left lower leg. Left lower leg with swelling, erythema, and skin changes consistent with vascular insufficiency.  There is a large malodorous ulcerated wound to the medial aspect as well as smaller wound to the lateral aspect with live maggots.  Leg is significantly tender.  See images below.  Neurological: He is alert.  Skin: Skin is warm.  Psychiatric: He has a normal mood and affect. His behavior is normal.  Nursing note and vitals reviewed.            ED Treatments / Results  Labs (all labs ordered are listed, but only abnormal results are displayed) Labs Reviewed  COMPREHENSIVE METABOLIC PANEL - Abnormal; Notable for the following components:      Result Value   Potassium 3.4 (*)    BUN 31 (*)    Creatinine, Ser 1.60 (*)    Calcium 8.7 (*)    Albumin 3.4 (*)    Alkaline Phosphatase 142 (*)    GFR calc non Af Amer 41 (*)    GFR calc Af Amer 48 (*)    All other components within normal limits  CULTURE, BLOOD (ROUTINE X 2)  CULTURE, BLOOD (ROUTINE X 2)  CBC WITH DIFFERENTIAL/PLATELET  CBC  CREATININE, SERUM  I-STAT CG4 LACTIC ACID, ED  I-STAT CG4 LACTIC ACID, ED    EKG None  Radiology Dg Tibia/fibula Left  Result Date: 02/02/2018 CLINICAL DATA:  Left leg cellulitis and pain. EXAM: LEFT TIBIA AND FIBULA - 2 VIEW COMPARISON:  None. FINDINGS: Diffuse soft tissue swelling. No bone destruction or periosteal reaction. No soft tissue gas. Diffuse osteopenia. Mild atheromatous arterial calcifications. IMPRESSION:  Diffuse soft tissue swelling without radiographic evidence of underlying osteomyelitis. Electronically Signed   By: Beckie Salts M.D.   On: 02/02/2018 13:58   Dg Foot Complete Left  Result Date: 02/02/2018 CLINICAL DATA:  Left leg and foot cellulitis. EXAM: LEFT FOOT - COMPLETE 3+ VIEW COMPARISON:  None. FINDINGS: Diffuse soft tissue swelling and diffuse osteopenia. No soft tissue gas, bone destruction or periosteal reaction. IMPRESSION: Diffuse soft tissue swelling and diffuse osteopenia without radiographic evidence of underlying osteomyelitis. Electronically Signed   By: Beckie Salts M.D.   On: 02/02/2018 13:59    Procedures Procedures (including critical care time)  Medications Ordered in ED Medications  metroNIDAZOLE (  FLAGYL) IVPB 500 mg (0 mg Intravenous Stopped 02/02/18 1451)  sodium chloride 0.9 % bolus 1,000 mL (0 mLs Intravenous Stopped 02/02/18 1419)    And  sodium chloride 0.9 % bolus 1,000 mL (1,000 mLs Intravenous New Bag/Given 02/02/18 1348)    And  sodium chloride 0.9 % bolus 1,000 mL (has no administration in time range)  vancomycin (VANCOCIN) 1,750 mg in sodium chloride 0.9 % 500 mL IVPB (1,750 mg Intravenous New Bag/Given 02/02/18 1432)  ibuprofen (ADVIL,MOTRIN) tablet 800 mg (has no administration in time range)  lactulose (CHRONULAC) 10 GM/15ML solution 20 g (has no administration in time range)  amLODipine (NORVASC) tablet 10 mg (has no administration in time range)  atorvastatin (LIPITOR) tablet 40 mg (has no administration in time range)  metoprolol tartrate (LOPRESSOR) tablet 100 mg (has no administration in time range)  baclofen (LIORESAL) tablet 10 mg (has no administration in time range)  enoxaparin (LOVENOX) injection 40 mg (has no administration in time range)  0.9 %  sodium chloride infusion (has no administration in time range)  ceFEPIme (MAXIPIME) 2 g in sodium chloride 0.9 % 100 mL IVPB (0 g Intravenous Stopped 02/02/18 1422)  HYDROcodone-acetaminophen  (NORCO/VICODIN) 5-325 MG per tablet 1 tablet (1 tablet Oral Given 02/02/18 1326)     Initial Impression / Assessment and Plan / ED Course  I have reviewed the triage vital signs and the nursing notes.  Pertinent labs & imaging results that were available during my care of the patient were reviewed by me and considered in my medical decision making (see chart for details).  Pt presenting to the ED with left leg wound and cellulitis that was cultured on outpatient basis, positive for Pseudomonas.  Patient with large ulcerated wound to the medial aspect of the left lower leg as well as smaller wound to the lateral and posterior aspect with live maggots.  There is surrounding erythema and swelling with significant tenderness.  He is afebrile without systemic symptoms.  No leukocytosis and normal lactic acid.  Creatinine is mildly elevated at 1.6.  Pain treated in the ED.  Patient discussed with Dr. Silverio Lay.  Fluids, as well as antibiotics ordered.  X-rays negative for evidence of osteomyelitis. Hospitalist consulted for admission.  Clinical Course as of Feb 03 1536  Fri Feb 02, 2018  1454 Dr. Mahala Menghini with triad accepting admission.  Requesting surgery consult.   [JR]  1521 Surgery consulted, recommending no surgical intervention is indicated at this time. Recommends wound care nurse clean wound. Surgery will consult after as requested by wound care nurse.   [JR]  1529 Culture results received from PCP. Positive for beta hemolytic strep, citrobacter freundii, pseudomonas, and staph xylosus. Unclear if it was a deep or superficial wound culture based on document.   [JR]    Clinical Course User Index [JR] Emine Lopata, [place] N, PA-C   The patient appears reasonably stabilized for admission considering the current resources, flow, and capabilities available in the ED at this time, and I doubt any other [facility] requiring further screening and/or treatment in the ED prior to admission.  Final Clinical  Impressions(s) / ED Diagnoses   Final diagnoses:  Cellulitis of left leg    ED Discharge Orders    None       Shalia Bartko, [place] N, PA-C 02/02/18 1538    Charlynne Pander, MD 02/05/18 1504

## 2018-02-03 LAB — CBC
HCT: 43.5 % (ref 39.0–52.0)
Hemoglobin: 13.5 g/dL (ref 13.0–17.0)
MCH: 27 pg (ref 26.0–34.0)
MCHC: 31 g/dL (ref 30.0–36.0)
MCV: 87 fL (ref 78.0–100.0)
PLATELETS: 307 10*3/uL (ref 150–400)
RBC: 5 MIL/uL (ref 4.22–5.81)
RDW: 13.3 % (ref 11.5–15.5)
WBC: 7.9 10*3/uL (ref 4.0–10.5)

## 2018-02-03 LAB — COMPREHENSIVE METABOLIC PANEL
ALK PHOS: 113 U/L (ref 38–126)
ALT: 8 U/L (ref 0–44)
ANION GAP: 7 (ref 5–15)
AST: 12 U/L — ABNORMAL LOW (ref 15–41)
Albumin: 2.6 g/dL — ABNORMAL LOW (ref 3.5–5.0)
BUN: 23 mg/dL (ref 8–23)
CALCIUM: 7.2 mg/dL — AB (ref 8.9–10.3)
CO2: 26 mmol/L (ref 22–32)
CREATININE: 1.35 mg/dL — AB (ref 0.61–1.24)
Chloride: 106 mmol/L (ref 98–111)
GFR, EST AFRICAN AMERICAN: 59 mL/min — AB (ref >60)
GFR, EST NON AFRICAN AMERICAN: 51 mL/min — AB (ref >60)
Glucose, Bld: 92 mg/dL (ref 70–99)
Potassium: 2.9 mmol/L — ABNORMAL LOW (ref 3.5–5.1)
SODIUM: 139 mmol/L (ref 135–145)
TOTAL PROTEIN: 6.2 g/dL — AB (ref 6.5–8.1)
Total Bilirubin: 0.5 mg/dL (ref 0.3–1.2)

## 2018-02-03 MED ORDER — POTASSIUM CHLORIDE 10 MEQ/100ML IV SOLN
10.0000 meq | Freq: Once | INTRAVENOUS | Status: AC
  Administered 2018-02-03: 10 meq via INTRAVENOUS
  Filled 2018-02-03: qty 100

## 2018-02-03 NOTE — Evaluation (Signed)
Physical Therapy Evaluation Patient Details Name: Joel Solis MRN: [id number] DOB: 06-03-1945 Today's Date: 02/03/2018   History of Present Illness  72yo male arriving to ED with LE edema and maggots in LE wounds. Diagnosed with sepsis due to wound infection. PMH HTN, chronic LE wounds, CVA 2011, CKD, VP shunt   Clinical Impression  Patient received in bed, very pleasant and willing to participate in PT session today; he reports he has been receiving HHPT at home, and has been able to perform transfers with transfer pole, usually uses motorized WC but has recently had multiple falls during transfers. He requires ModA for functional bed mobility but reports at home he is able to use bed functions as well as overhead trapeze with Mod(I). He required [place] for squat-pivot transfer to the right in drop arm recliner as well as MinA to reposition in recliner chair today. He was left up in the recliner with all needs met, chair alarm active, and RN present and aware of mobility status/PT recommendation for nursing staff to use lift for back to bed. He will continue to benefit from skilled PT services in the acute setting, PT recommends ST-SNF moving forward however patient vehemently refuses- thus he will require HHPT and 24/7 assist/supervision for safety due to recent falls moving forward.     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  Restrictions Weight Bearing Restrictions: No      Mobility  Bed Mobility Overal bed mobility: Needs Assistance Bed Mobility: Supine to Sit     Supine to sit: Mod assist     General bed mobility  comments: ModA to come to full upright at EOB, reports he has overhead trapeze and hospital bed at home and can perform mobility with bed functions   Transfers Overall transfer level: Needs assistance Equipment used: 1 person hand held assist Transfers: Squat Pivot Transfers     Squat pivot transfers: Mod assist     General transfer comment: ModA for squat pivot to drop arm recliner, Mod cues and MinA to reposition in chair   Ambulation/Gait             General Gait Details: DNT due to safety concerns   Stairs            Wheelchair Mobility    Modified Rankin (Stroke Patients Only)       Balance Overall balance assessment: Needs assistance;History of Falls Sitting-balance support: Feet supported;No upper extremity supported Sitting balance-Leahy Scale: Good       Standing balance-Leahy Scale: Poor                               Pertinent Vitals/Pain Pain Assessment: Faces Faces Pain Scale: Hurts even more Pain Location: wounds L LE  Pain Descriptors / Indicators: Aching;Sore Pain Intervention(s): Limited activity within patient's tolerance;Monitored during session;Patient requesting pain meds-RN notified    Home Living Family/patient expects to be discharged to:: Private residence Living Arrangements: Alone Available Help at Discharge: Family;Available PRN/intermittently Type of Home: Apartment Home Access: Level entry     Home Layout: One level Home Equipment: Wheelchair - power;Tub bench;Adaptive equipment;Hospital bed  Prior Function Level of Independence: Independent with assistive device(s)         Comments: able to use transfer pole independently, has home health RN and is working on getting home aide      Hand Dominance        Extremity/Trunk Assessment   Upper Extremity Assessment Upper Extremity Assessment: Defer to OT evaluation;LUE deficits/detail LUE Deficits / Details: gross hemiparesis and contracture L UE  from chronic CVA     Lower Extremity Assessment Lower Extremity Assessment: RLE deficits/detail;LLE deficits/detail RLE Deficits / Details: WNL  LLE Deficits / Details: 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     Cervical / Trunk Assessment Cervical / Trunk Assessment: Kyphotic  Communication   Communication: No difficulties  Cognition Arousal/Alertness: Awake/alert Behavior During Therapy: WFL for tasks assessed/performed Overall Cognitive Status: Within Functional Limits for tasks assessed                                        General Comments      Exercises     Assessment/Plan    PT Assessment Patient needs continued PT services  PT Problem List Decreased strength;Decreased mobility;Decreased safety awareness;Decreased coordination;Decreased activity tolerance;Decreased balance;Pain;Decreased knowledge of use of DME       PT Treatment Interventions DME instruction;Therapeutic activities;Gait training;Therapeutic exercise;Patient/family education;Stair training;Balance training;Wheelchair mobility training;Functional mobility training;Neuromuscular re-education    PT Goals (Current goals can be found in the Care Plan section)  Acute Rehab PT Goals Patient Stated Goal: to go home  PT Goal Formulation: With patient Time For Goal Achievement: 02/17/18 Potential to Achieve Goals: Fair    Frequency Min 3X/week   Barriers to discharge        Co-evaluation               AM-PAC PT "6 Clicks" Daily Activity  Outcome Measure Difficulty turning over in bed (including adjusting bedclothes, sheets and blankets)?: Unable Difficulty moving from lying on back to sitting on the side of the bed? : Unable Difficulty sitting down on and standing up from a chair with arms (e.g., wheelchair, bedside commode, etc,.)?: Unable Help needed moving to and from a bed to chair (including a wheelchair)?: A Lot Help needed walking in hospital room?: A Lot Help needed  climbing 3-5 steps with a railing? : Total 6 Click Score: 8    End of Session Equipment Utilized During Treatment: Gait belt Activity Tolerance: Patient tolerated treatment well Patient left: in chair;with call bell/phone within reach;with nursing/sitter in room;with chair alarm set Nurse Communication: Mobility status;Need for lift equipment PT Visit Diagnosis: Unsteadiness on feet (R26.81);Other abnormalities of gait and mobility (R26.89);History of falling (Z91.81);Hemiplegia and hemiparesis Hemiplegia - Right/Left: Left Hemiplegia - dominant/non-dominant: Non-dominant Hemiplegia - caused by: Cerebral infarction(old CVA 2011 )    Time: 3557-3220 PT Time Calculation (min) (ACUTE ONLY): 41 min   Charges:   PT Evaluation $PT Eval Moderate Complexity: 1 Mod PT Treatments $Therapeutic Activity: 23-37 mins        Deniece Ree PT, DPT, CBIS  Supplemental Physical Therapist [place]    Pager [phone] Acute Rehab Office [phone]

## 2018-02-03 NOTE — Progress Notes (Signed)
PROGRESS NOTE  Joel PearsonDaniel Velardi XBJ:[id number] DOB: [birth year] DOA: 02/02/2018 PCP: [facility]  HPI/Recap of past 24 hours:  Joel Solis is a 72 y.o. male with past medical history of hypertension, status post VP shunt, hemorrhagic stroke, presenting to the ED with wound to left leg.  He states his home health nurse has been caring for this wound to his left leg that he believes began about 1 month ago.  He states leg is significantly painful worse with palpation.  Has been taking ibuprofen for pain.  Reports his nurse called him today and told him the wound culture came back positive so he needed to report to the ED for admission.  He is unable to recall what the culture resulted as.  He denies fever, chills, or systemic symptoms.  Denies history of diabetes.  Subjective patient doing well denies any fever chills.  Assessment/Plan: Active Problems:   Lower extremity cellulitis   Infected superficial injury of toe of right foot   Cellulitis  Lower extremity cellulitis 02/02/2018   . Infected superficial injury of toe of right foot 02/02/2018  . AKI (acute kidney injury) (HCC) 11/22/2016  . Inneffective medication administration routine at home 11/22/2016  . Dirty living conditions 11/22/2016  . Elevated troponin 11/16/2016  . Acute metabolic encephalopathy 11/15/2016  . Hypokalemia 11/15/2016  . Essential hypertension   . HLD (hyperlipidemia)   . 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 S/P VP shunt   . Tobacco abuse     1 cellulitis with chronic left lower extremity ulcer continue Maxipen waiting for surgical consult for wound care likely going to need debridement.  We will continue dressing 2.  Hypertension stable continue lisinopril and Lopressor and amlodipine  Code Status: Full  Family Communication: None at bedside  Disposition Plan: Possibly  home   Consultants:  Surgery  Procedures:  None  Antimicrobials:  Cefepime  DVT prophylaxis: Subcu Lovenox   Objective: Vitals:   02/02/18 1900 02/03/18 0530  BP: 130/76 136/83  Pulse: 75 70  Resp: 17 16  Temp: 97.8 F (36.6 C) 98.4 F (36.9 C)  SpO2: 97% 97%    Intake/Output Summary (Last 24 hours) at 02/03/2018 0849 Last data filed at 02/03/2018 0553 Gross per 24 hour  Intake 3595.13 ml  Output 2200 ml  Net 1395.13 ml   Filed Weights   02/02/18 1315  Weight: 88.5 kg   Body mass index is 29.65 kg/m.  Exam:   General: Obese well-developed well-nourished  Cardiovascular: Regular rate and rhythm no murmur  Respiratory: Effort is normal no wheezes or rhonchi  Abdomen: Full soft nontender no hepatosplenomegaly normoactive bowel sounds  Musculoskeletal: Moves all 4 extremities no edema  Skin: Large ulcer on the left posterior leg lower leg near the ankle.  Anteriorly there is crustiness scaliness and an ulcer in the lower leg just above the ankle  Psychiatry: Normal mood affect and behavior   Data Reviewed: CBC: Recent Labs  Lab 02/02/18 1136 02/03/18 0411  WBC 8.4 7.9  NEUTROABS 4.8  --   HGB 15.7 13.5  HCT 48.4 43.5  MCV 86.1 87.0  PLT 340 307   Basic Metabolic Panel: Recent Labs  Lab 02/02/18 1136 02/03/18 0411  NA 142 139  K 3.4* 2.9*  CL 105 106  CO2 28 26  GLUCOSE 89 92  BUN 31* 23  CREATININE 1.60* 1.35*  CALCIUM 8.7* 7.2*   GFR: Estimated Creatinine Clearance: 53.4 mL/min (A) (  by C-G formula based on SCr of 1.35 mg/dL (H)). Liver Function Tests: Recent Labs  Lab 02/02/18 1136 02/03/18 0411  AST 15 12*  ALT 12 8  ALKPHOS 142* 113  BILITOT 0.9 0.5  PROT 7.9 6.2*  ALBUMIN 3.4* 2.6*   No results for input(s): LIPASE, AMYLASE in the last 168 hours. No results for input(s): AMMONIA in the last 168 hours. Coagulation Profile: No results for input(s): INR, PROTIME in the last 168 hours. Cardiac Enzymes: No results  for input(s): CKTOTAL, CKMB, CKMBINDEX, TROPONINI in the last 168 hours. BNP (last 3 results) No results for input(s): PROBNP in the last 8760 hours. HbA1C: No results for input(s): HGBA1C in the last 72 hours. CBG: No results for input(s): GLUCAP in the last 168 hours. Lipid Profile: No results for input(s): CHOL, HDL, LDLCALC, TRIG, CHOLHDL, LDLDIRECT in the last 72 hours. Thyroid Function Tests: No results for input(s): TSH, T4TOTAL, FREET4, T3FREE, THYROIDAB in the last 72 hours. Anemia Panel: No results for input(s): VITAMINB12, FOLATE, FERRITIN, TIBC, IRON, RETICCTPCT in the last 72 hours. Urine analysis:    Component Value Date/Time   COLORURINE AMBER (A) 11/22/2016 1336   APPEARANCEUR HAZY (A) 11/22/2016 1336   LABSPEC 1.021 11/22/2016 1336   PHURINE 5.0 11/22/2016 1336   GLUCOSEU NEGATIVE 11/22/2016 1336   HGBUR SMALL (A) 11/22/2016 1336   BILIRUBINUR NEGATIVE 11/22/2016 1336   KETONESUR NEGATIVE 11/22/2016 1336   PROTEINUR NEGATIVE 11/22/2016 1336   NITRITE NEGATIVE 11/22/2016 1336   LEUKOCYTESUR LARGE (A) 11/22/2016 1336   Sepsis Labs: @LABRCNTIP (procalcitonin:4,lacticidven:4)  ) Recent Results (from the past 240 hour(s))  MRSA PCR Screening     Status: None   Collection Time: 02/02/18  7:37 PM  Result Value Ref Range Status   MRSA by PCR NEGATIVE NEGATIVE Final    Comment:        The GeneXpert MRSA Assay (FDA approved for NASAL specimens only), is one component of a comprehensive MRSA colonization surveillance program. It is not intended to diagnose MRSA infection nor to guide or monitor treatment for MRSA infections. Performed at [facility], [address] [place], [place] [address]       Studies: Dg Tibia/fibula Left  Result Date: 02/02/2018 CLINICAL DATA:  Left leg cellulitis and pain. EXAM: LEFT TIBIA AND FIBULA - 2 VIEW COMPARISON:  None. FINDINGS: Diffuse soft tissue swelling. No bone destruction or periosteal reaction. No  soft tissue gas. Diffuse osteopenia. Mild atheromatous arterial calcifications. IMPRESSION: Diffuse soft tissue swelling without radiographic evidence of underlying osteomyelitis. Electronically Signed   By: Beckie Salts M.D.   On: 02/02/2018 13:58   Dg Foot Complete Left  Result Date: 02/02/2018 CLINICAL DATA:  Left leg and foot cellulitis. EXAM: LEFT FOOT - COMPLETE 3+ VIEW COMPARISON:  None. FINDINGS: Diffuse soft tissue swelling and diffuse osteopenia. No soft tissue gas, bone destruction or periosteal reaction. IMPRESSION: Diffuse soft tissue swelling and diffuse osteopenia without radiographic evidence of underlying osteomyelitis. Electronically Signed   By: Beckie Salts M.D.   On: 02/02/2018 13:59    Scheduled Meds: . amLODipine  10 mg Oral Daily  . atorvastatin  40 mg Oral Daily  . baclofen  10 mg Oral TID  . enoxaparin (LOVENOX) injection  40 mg Subcutaneous Q24H  . Influenza vac split quadrivalent PF  0.5 mL Intramuscular Tomorrow-1000  . lactulose  20 g Oral Daily  . metoprolol tartrate  100 mg Oral BID  . traMADol  50 mg Oral Q6H  Continuous Infusions: . sodium chloride 75 mL/hr at 02/02/18 2032  . ceFEPime (MAXIPIME) IV    . metronidazole 500 mg (02/03/18 0545)  . potassium chloride       LOS: 1 day     Myrtie Neither, MD [facility]  To reach me or the doctor on call, go to: [email or web address] Password [facility]  02/03/2018, 8:49 AM

## 2018-02-03 NOTE — Progress Notes (Signed)
Pt's potassium level 2.9. MD on call notified of lab results. Await further orders

## 2018-02-04 NOTE — Care Management Note (Addendum)
Case Management Note  Patient Details  Name: Joel Solis MRN: [id number] Date of Birth: 1945/12/14  Subjective/Objective:  Sepsis, wound infection                  Action/Plan: NCM spoke to pt and states he lives at home alone. [facility] Pt is declining SNF and [facility]. He was active with HHRN, PT, Aide and SW. Will need resumption of care orders. Message left for attending.  Has power wheelchair, transfer poles, hospital bed, and one hand walker at home. [facility].  Will need PT[facility] transport home.   Expected Discharge Date:                 Expected Discharge Plan:  Home w Home Health Services  In-House Referral:  NA  Discharge planning Services  CM Consult  Post Acute Care Choice:  Home Health, Resumption of Svcs/PTA Provider Choice offered to:  Patient  DME Arranged:  N/A DME Agency:  NA  HH Arranged:  RN, PT, Nurse's Aide, Social Work, Refused SNF HH Agency[facility]  Status of Service:  In process, will continue to follow  If discussed at Long Length of Stay Meetings, dates discussed:    Additional Comments:  Elliot CousinShavis, Chewey Wenzlick Ellen, RN 02/04/2018, 10:07 AM

## 2018-02-04 NOTE — Progress Notes (Signed)
PROGRESS NOTE  Joel PearsonDaniel Solis [id number] DOB: Nov 17, 1945 DOA: 02/02/2018 PCP: [facility]  HPI/Recap of past 24 hours:  Joel PearsonDaniel Bracknell is a 72 y.o. male with past medical history of hypertension, status post VP shunt, hemorrhagic stroke, presenting to the ED with wound to left leg.  He states his home health nurse has been caring for this wound to his left leg that he believes began about 1 month ago.  He states leg is significantly painful worse with palpation.  Has been taking ibuprofen for pain.  Reports his nurse called him today and told him the wound culture came back positive so he needed to report to the ED for admission.  He is unable to recall what the culture resulted as.  He denies fever, chills, or systemic symptoms.  Denies history of diabetes.   Subjective patient doing well denies any fever chills or pain.  Daughter is at bedside  Assessment/Plan: Active Problems:   Lower extremity cellulitis   Infected superficial injury of toe of right foot   Cellulitis  Lower extremity cellulitis 02/02/2018   . Infected superficial injury of toe of right foot 02/02/2018  . AKI (acute kidney injury) (HCC) 11/22/2016  . Inneffective medication administration routine at home 11/22/2016  . Dirty living conditions 11/22/2016  . Elevated troponin 11/16/2016  . Acute metabolic encephalopathy 11/15/2016  . Hypokalemia 11/15/2016  . Essential hypertension   . HLD (hyperlipidemia)   . 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 S/P VP shunt   . Tobacco abuse     1 cellulitis with chronic left lower extremity ulcer continue Maxipen waiting for surgical consult for wound care likely going to need debridement.  We will continue dressing 2.  Hypertension stable continue lisinopril and Lopressor and amlodipine  Code Status: Full  Family Communication: Daughter at bedside  Disposition Plan: Possibly  home   Consultants:  Surgery  Procedures:  None  Antimicrobials:  Cefepime  DVT prophylaxis: Subcu Lovenox   Objective: Vitals:   02/04/18 0542 02/04/18 1419  BP: (!) 156/86 (!) 156/79  Pulse: (!) 59 62  Resp: 18 18  Temp: 97.8 F (36.6 C) 98.1 F (36.7 C)  SpO2: 98% 97%    Intake/Output Summary (Last 24 hours) at 02/04/2018 1932 Last data filed at 02/04/2018 1800 Gross per 24 hour  Intake 1176 ml  Output 2150 ml  Net -974 ml   Filed Weights   02/02/18 1315  Weight: 88.5 kg   Body mass index is 29.65 kg/m.  Exam:   General: Obese well-developed well-nourished  Cardiovascular: Regular rate and rhythm no murmur  Respiratory: Effort is normal no wheezes or rhonchi  Abdomen: Full soft nontender no hepatosplenomegaly normoactive bowel sounds  Musculoskeletal: Moves all 4 extremities no edema  Skin: Large ulcer on the left posterior leg lower leg near the ankle.  Anteriorly there is crustiness scaliness and an ulcer in the lower leg just above the ankle  Psychiatry: Normal mood affect and behavior   Data Reviewed: CBC: Recent Labs  Lab 02/02/18 1136 02/03/18 0411  WBC 8.4 7.9  NEUTROABS 4.8  --   HGB 15.7 13.5  HCT 48.4 43.5  MCV 86.1 87.0  PLT 340 307   Basic Metabolic Panel: Recent Labs  Lab 02/02/18 1136 02/03/18 0411  NA 142 139  K 3.4* 2.9*  CL 105 106  CO2 28 26  GLUCOSE 89 92  BUN 31* 23  CREATININE 1.60* 1.35*  CALCIUM  8.7* 7.2*   GFR: Estimated Creatinine Clearance: 53.4 mL/min (A) (by C-G formula based on SCr of 1.35 mg/dL (H)). Liver Function Tests: Recent Labs  Lab 02/02/18 1136 02/03/18 0411  AST 15 12*  ALT 12 8  ALKPHOS 142* 113  BILITOT 0.9 0.5  PROT 7.9 6.2*  ALBUMIN 3.4* 2.6*   No results for input(s): LIPASE, AMYLASE in the last 168 hours. No results for input(s): AMMONIA in the last 168 hours. Coagulation Profile: No results for input(s): INR, PROTIME in the last 168 hours. Cardiac Enzymes: No  results for input(s): CKTOTAL, CKMB, CKMBINDEX, TROPONINI in the last 168 hours. BNP (last 3 results) No results for input(s): PROBNP in the last 8760 hours. HbA1C: No results for input(s): HGBA1C in the last 72 hours. CBG: No results for input(s): GLUCAP in the last 168 hours. Lipid Profile: No results for input(s): CHOL, HDL, LDLCALC, TRIG, CHOLHDL, LDLDIRECT in the last 72 hours. Thyroid Function Tests: No results for input(s): TSH, T4TOTAL, FREET4, T3FREE, THYROIDAB in the last 72 hours. Anemia Panel: No results for input(s): VITAMINB12, FOLATE, FERRITIN, TIBC, IRON, RETICCTPCT in the last 72 hours. Urine analysis:    Component Value Date/Time   COLORURINE AMBER (A) 11/22/2016 1336   APPEARANCEUR HAZY (A) 11/22/2016 1336   LABSPEC 1.021 11/22/2016 1336   PHURINE 5.0 11/22/2016 1336   GLUCOSEU NEGATIVE 11/22/2016 1336   HGBUR SMALL (A) 11/22/2016 1336   BILIRUBINUR NEGATIVE 11/22/2016 1336   KETONESUR NEGATIVE 11/22/2016 1336   PROTEINUR NEGATIVE 11/22/2016 1336   NITRITE NEGATIVE 11/22/2016 1336   LEUKOCYTESUR LARGE (A) 11/22/2016 1336   Sepsis Labs: @LABRCNTIP (procalcitonin:4,lacticidven:4)  ) Recent Results (from the past 240 hour(s))  Culture, blood (routine x 2)     Status: None (Preliminary result)   Collection Time: 02/02/18  1:40 PM  Result Value Ref Range Status   Specimen Description   Final    BLOOD BLOOD RIGHT FOREARM Performed at [facility], [address] [place], [place] [address]    Special Requests   Final    BOTTLES DRAWN AEROBIC AND ANAEROBIC Blood Culture adequate volume Performed at [facility], [address], [place], [place] [address]    Culture   Final    NO GROWTH 2 DAYS Performed at [facility], [address], [place], [place] [address]    Report Status PENDING  Incomplete  Culture, blood (routine x 2)     Status: None (Preliminary result)   Collection Time: 02/02/18  1:40 PM  Result  Value Ref Range Status   Specimen Description   Final    BLOOD RIGHT ARM Performed at [facility], [address], [place], [place] [address]    Special Requests   Final    BOTTLES DRAWN AEROBIC AND ANAEROBIC Blood Culture adequate volume Performed at [facility], [address] [place], [place] [address]    Culture   Final    NO GROWTH 2 DAYS Performed at [facility], [address], [place], [place] [address]    Report Status PENDING  Incomplete  MRSA PCR Screening     Status: None   Collection Time: 02/02/18  7:37 PM  Result Value Ref Range Status   MRSA by PCR NEGATIVE NEGATIVE Final    Comment:        The GeneXpert MRSA Assay (FDA approved for NASAL specimens only), is one component of a comprehensive MRSA colonization surveillance program. It is not intended  to diagnose MRSA infection nor to guide or monitor treatment for MRSA infections. Performed at [facility], [address] [place], [place] [address]       Studies: No results found.  Scheduled Meds: . amLODipine  10 mg Oral Daily  . atorvastatin  40 mg Oral Daily  . baclofen  10 mg Oral TID  . enoxaparin (LOVENOX) injection  40 mg Subcutaneous Q24H  . lactulose  20 g Oral Daily  . metoprolol tartrate  100 mg Oral BID  . traMADol  50 mg Oral Q6H    Continuous Infusions: . sodium chloride 75 mL/hr at 02/04/18 1102  . ceFEPime (MAXIPIME) IV 2 g (02/04/18 1415)  . metronidazole 500 mg (02/04/18 1226)     LOS: 2 days     Myrtie Neither, MD [facility]  To reach me or the doctor on call, go to: [email or web address] Password [facility]  02/04/2018, 7:32 PM

## 2018-02-05 LAB — CBC WITH DIFFERENTIAL/PLATELET
Basophils Absolute: 0.1 10*3/uL (ref 0.0–0.1)
Basophils Relative: 1 %
EOS PCT: 4 %
Eosinophils Absolute: 0.3 10*3/uL (ref 0.0–0.7)
HCT: 45.6 % (ref 39.0–52.0)
HEMOGLOBIN: 14.5 g/dL (ref 13.0–17.0)
LYMPHS ABS: 2 10*3/uL (ref 0.7–4.0)
Lymphocytes Relative: 27 %
MCH: 27.3 pg (ref 26.0–34.0)
MCHC: 31.8 g/dL (ref 30.0–36.0)
MCV: 85.7 fL (ref 78.0–100.0)
MONOS PCT: 9 %
Monocytes Absolute: 0.7 10*3/uL (ref 0.1–1.0)
Neutro Abs: 4.3 10*3/uL (ref 1.7–7.7)
Neutrophils Relative %: 59 %
Platelets: 326 10*3/uL (ref 150–400)
RBC: 5.32 MIL/uL (ref 4.22–5.81)
RDW: 13.1 % (ref 11.5–15.5)
WBC: 7.4 10*3/uL (ref 4.0–10.5)

## 2018-02-05 LAB — BASIC METABOLIC PANEL
Anion gap: 8 (ref 5–15)
BUN: 15 mg/dL (ref 8–23)
CHLORIDE: 106 mmol/L (ref 98–111)
CO2: 25 mmol/L (ref 22–32)
Calcium: 8.4 mg/dL — ABNORMAL LOW (ref 8.9–10.3)
Creatinine, Ser: 1.04 mg/dL (ref 0.61–1.24)
GFR calc Af Amer: >60 mL/min (ref >60)
GFR calc non Af Amer: >60 mL/min (ref >60)
GLUCOSE: 90 mg/dL (ref 70–99)
POTASSIUM: 3.3 mmol/L — AB (ref 3.5–5.1)
Sodium: 139 mmol/L (ref 135–145)

## 2018-02-05 LAB — MAGNESIUM: Magnesium: 2 mg/dL (ref 1.7–2.4)

## 2018-02-05 MED ORDER — SODIUM CHLORIDE 0.9 % IV SOLN
2.0000 g | Freq: Two times a day (BID) | INTRAVENOUS | Status: DC
  Administered 2018-02-05 – 2018-02-06 (×3): 2 g via INTRAVENOUS
  Filled 2018-02-05 (×4): qty 2

## 2018-02-05 NOTE — Progress Notes (Signed)
PROGRESS NOTE  Joel PearsonDaniel Reade ZOX:[id number] DOB: Jul 27, 1945 DOA: 02/02/2018 PCP: [facility]  HPI/Recap of past 24 hours: Joel HoesDaniel Powersis a 72 y.o.malewith past medical history of hypertension, status post VP shunt, hemorrhagic stroke, presenting to the ED with wound to left leg. He states his home health nurse has been caring for this wound to his left leg that he believes began about 1 month ago. He states leg is significantly painful worse with palpation. Has been taking ibuprofen for pain. Reports his nurse called him today and told him the wound culture came back positive so he needed to report to the ED for admission. He is unable to recall what the culture resulted as. He denies fever, chills, or systemic symptoms. Denies history of diabetes.   02/05/2018: Patient seen and examined at bedside.  Reports moderate pain in his left lower extremity on exam.  Wound care nurse specialist consulted and following.   Assessment/Plan: Active Problems:   Lower extremity cellulitis   Infected superficial injury of toe of right foot   Cellulitis   Lower extremity cellulitis complicated by chronic left lower extremity ulcer Continue IV antibiotics Wound care specialist consulted and following Continue wound dressing per wound care especially recommendations Monitor.  Care Monitor WBC Obtain CBC in the morning  Hypertension Blood pressure stable Continue lisinopril, Lopressor, amlodipine  Hypokalemia Potassium 3.3 Repleted with p.o. potassium 40 mEq once Repeat BMP in the morning  Code Status: Full code  Family Communication: None at bedside  Disposition Plan: Home in 1 to 2 days when clinically stable   Consultants:  Wound care specialist  General surgery  Procedures:  None  Antimicrobials:  IV cefepime  DVT prophylaxis: Subcu Lovenox daily   Objective: Vitals:   02/04/18 0542 02/04/18 1419 02/04/18 2127 02/05/18 1342  BP:  (!) 156/86 (!) 156/79 (!) 149/75 (!) 144/80  Pulse: (!) 59 62 63 60  Resp: 18 18 18 18   Temp: 97.8 F (36.6 C) 98.1 F (36.7 C) 98.3 F (36.8 C) 98.6 F (37 C)  TempSrc: Oral Oral Oral Oral  SpO2: 98% 97% 99% 98%  Weight:      Height:        Intake/Output Summary (Last 24 hours) at 02/05/2018 1847 Last data filed at 02/05/2018 1800 Gross per 24 hour  Intake 1700 ml  Output 2300 ml  Net -600 ml   Filed Weights   02/02/18 1315  Weight: 88.5 kg    Exam:  . General: 72 y.o. year-old male well developed well nourished in no acute distress.  Alert and oriented x3. . Cardiovascular: Regular rate and rhythm with no rubs or gallops.  No thyromegaly or JVD noted.   Marland Kitchen. Respiratory: Clear to auscultation with no wheezes or rales. Good inspiratory effort. . Abdomen: Soft nontender nondistended with normal bowel sounds x4 quadrants. . Musculoskeletal: 2/4 pulses in all 4 extremities. . Skin: Large ulcer noted on left posterior lower leg . Psychiatry: Mood is appropriate for condition and setting   Data Reviewed: CBC: Recent Labs  Lab 02/02/18 1136 02/03/18 0411 02/05/18 0357  WBC 8.4 7.9 7.4  NEUTROABS 4.8  --  4.3  HGB 15.7 13.5 14.5  HCT 48.4 43.5 45.6  MCV 86.1 87.0 85.7  PLT 340 307 326   Basic Metabolic Panel: Recent Labs  Lab 02/02/18 1136 02/03/18 0411 02/05/18 0357  NA 142 139 139  K 3.4* 2.9* 3.3*  CL 105 106 106  CO2 28 26 25   GLUCOSE 89  92 90  BUN 31* 23 15  CREATININE 1.60* 1.35* 1.04  CALCIUM 8.7* 7.2* 8.4*  MG  --   --  2.0   GFR: Estimated Creatinine Clearance: 69.4 mL/min (by C-G formula based on SCr of 1.04 mg/dL). Liver Function Tests: Recent Labs  Lab 02/02/18 1136 02/03/18 0411  AST 15 12*  ALT 12 8  ALKPHOS 142* 113  BILITOT 0.9 0.5  PROT 7.9 6.2*  ALBUMIN 3.4* 2.6*   No results for input(s): LIPASE, AMYLASE in the last 168 hours. No results for input(s): AMMONIA in the last 168 hours. Coagulation Profile: No results for  input(s): INR, PROTIME in the last 168 hours. Cardiac Enzymes: No results for input(s): CKTOTAL, CKMB, CKMBINDEX, TROPONINI in the last 168 hours. BNP (last 3 results) No results for input(s): PROBNP in the last 8760 hours. HbA1C: No results for input(s): HGBA1C in the last 72 hours. CBG: No results for input(s): GLUCAP in the last 168 hours. Lipid Profile: No results for input(s): CHOL, HDL, LDLCALC, TRIG, CHOLHDL, LDLDIRECT in the last 72 hours. Thyroid Function Tests: No results for input(s): TSH, T4TOTAL, FREET4, T3FREE, THYROIDAB in the last 72 hours. Anemia Panel: No results for input(s): VITAMINB12, FOLATE, FERRITIN, TIBC, IRON, RETICCTPCT in the last 72 hours. Urine analysis:    Component Value Date/Time   COLORURINE AMBER (A) 11/22/2016 1336   APPEARANCEUR HAZY (A) 11/22/2016 1336   LABSPEC 1.021 11/22/2016 1336   PHURINE 5.0 11/22/2016 1336   GLUCOSEU NEGATIVE 11/22/2016 1336   HGBUR SMALL (A) 11/22/2016 1336   BILIRUBINUR NEGATIVE 11/22/2016 1336   KETONESUR NEGATIVE 11/22/2016 1336   PROTEINUR NEGATIVE 11/22/2016 1336   NITRITE NEGATIVE 11/22/2016 1336   LEUKOCYTESUR LARGE (A) 11/22/2016 1336   Sepsis Labs: @LABRCNTIP (procalcitonin:4,lacticidven:4)  ) Recent Results (from the past 240 hour(s))  Culture, blood (routine x 2)     Status: None (Preliminary result)   Collection Time: 02/02/18  1:40 PM  Result Value Ref Range Status   Specimen Description   Final    BLOOD BLOOD RIGHT FOREARM Performed at [facility], [address], [place], [place] [address]    Special Requests   Final    BOTTLES DRAWN AEROBIC AND ANAEROBIC Blood Culture adequate volume Performed at [facility], [address] [place], [place] [address]    Culture   Final    NO GROWTH 3 DAYS Performed at [facility], [address] [place], [place] [address]    Report Status PENDING  Incomplete  Culture, blood (routine x 2)     Status: None  (Preliminary result)   Collection Time: 02/02/18  1:40 PM  Result Value Ref Range Status   Specimen Description   Final    BLOOD RIGHT ARM Performed at [facility], [address], [place], [place] [address]    Special Requests   Final    BOTTLES DRAWN AEROBIC AND ANAEROBIC Blood Culture adequate volume Performed at [facility], [address], [place], [place] [address]    Culture   Final    NO GROWTH 3 DAYS Performed at [facility], [address], [place], [place] [address]    Report Status PENDING  Incomplete  MRSA PCR Screening     Status: None   Collection Time: 02/02/18  7:37 PM  Result Value Ref Range Status   MRSA by PCR NEGATIVE NEGATIVE Final    Comment:        The GeneXpert  MRSA Assay (FDA approved for NASAL specimens only), is one component of a comprehensive MRSA colonization surveillance program. It is not intended to diagnose MRSA infection nor to guide or monitor treatment for MRSA infections. Performed at [facility], [address], [place], [place] [address]       Studies: No results found.  Scheduled Meds: . amLODipine  10 mg Oral Daily  . atorvastatin  40 mg Oral Daily  . baclofen  10 mg Oral TID  . enoxaparin (LOVENOX) injection  40 mg Subcutaneous Q24H  . lactulose  20 g Oral Daily  . metoprolol tartrate  100 mg Oral BID  . traMADol  50 mg Oral Q6H    Continuous Infusions: . sodium chloride 75 mL/hr at 02/05/18 1753  . ceFEPime (MAXIPIME) IV 2 g (02/05/18 1210)  . metronidazole 500 mg (02/05/18 1336)     LOS: 3 days     Darlin Drop, MD [facility] Pager [phone]  If 7PM-7AM, please contact night-coverage [email or web address] Password [facility] 02/05/2018, 6:47 PM

## 2018-02-05 NOTE — Care Management Important Message (Signed)
Important Message  Patient Details  Name: Joel Solis MRN: [id number] Date of Birth: 08-May-1946   Medicare Important Message Given:  Yes    Caren MacadamFuller, Ying Blankenhorn 02/05/2018, 10:14 AMImportant Message  Patient Details  Name: Joel PearsonDaniel Solis MRN: [id number] Date of Birth: 08-May-1946   Medicare Important Message Given:  Yes    Caren MacadamFuller, Lovina Zuver 02/05/2018, 10:14 AM

## 2018-02-05 NOTE — Progress Notes (Signed)
Physical Therapy Treatment Patient Details Name: Joel Solis MRN: [id number] DOB: [birth year] Today's Date: 02/05/2018    History of Present Illness 71yo male arriving to ED with LE edema and maggots in LE wounds. Diagnosed with sepsis due to wound infection. PMH HTN, chronic LE wounds, CVA 2011, CKD, VP shunt     PT Comments    Pt declined getting up into recliner but agreed to sitting on edge of bed x 12 minutes. Performed LLE strengthening exercises. Pt would benefit from 24* assistance.   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  Restrictions Weight Bearing Restrictions: No    Mobility  Bed Mobility Overal bed mobility: Needs Assistance Bed Mobility: Supine to Sit     Supine to sit: Mod assist     General bed mobility comments: ModA to come to full upright at EOB, reports he has overhead trapeze and hospital bed at home and can perform mobility with bed functions; pt sat at edge of bed x 12 minutes, he declined to get up into recliner  Transfers                    Ambulation/Gait                 Stairs             Wheelchair Mobility    Modified Rankin (Stroke Patients Only)       Balance Overall balance assessment: Needs assistance;History of Falls Sitting-balance support: Feet supported;No upper extremity supported Sitting balance-Leahy Scale: Fair                                      Cognition Arousal/Alertness: Awake/alert Behavior During Therapy: WFL for tasks assessed/performed Overall Cognitive Status: Within Functional Limits for tasks  assessed                                        Exercises General Exercises - Lower Extremity Long Arc Quad: AROM;Left;10 reps;Seated    General Comments        Pertinent Vitals/Pain Pain Assessment: No/denies pain    Home Living                      Prior Function            PT Goals (current goals can now be found in the care plan section) Acute Rehab PT Goals Patient Stated Goal: to go home  PT Goal Formulation: With patient Time For Goal Achievement: 02/17/18 Potential to Achieve Goals: Fair Progress towards PT goals: Progressing toward goals    Frequency    Min 3X/week      PT Plan Current plan remains appropriate    Co-evaluation              AM-PAC PT "6 Clicks" Daily Activity  Outcome Measure  Difficulty turning over in bed (including adjusting bedclothes, sheets and blankets)?: Unable Difficulty moving from lying on back to sitting on the  side of the bed? : Unable Difficulty sitting down on and standing up from a chair with arms (e.g., wheelchair, bedside commode, etc,.)?: Unable Help needed moving to and from a bed to chair (including a wheelchair)?: A Lot Help needed walking in hospital room?: Total Help needed climbing 3-5 steps with a railing? : Total 6 Click Score: 7    End of Session   Activity Tolerance: Patient tolerated treatment well Patient left: with call bell/phone within reach;in bed Nurse Communication: Mobility status;Need for lift equipment(bed is saturated in urine; requested order for trapeze) PT Visit Diagnosis: Unsteadiness on feet (R26.81);Other abnormalities of gait and mobility (R26.89);History of falling (Z91.81);Hemiplegia and hemiparesis Hemiplegia - Right/Left: Left Hemiplegia - dominant/non-dominant: Non-dominant Hemiplegia - caused by: Cerebral infarction(old CVA 2011 )     Time: [id number] PT Time Calculation (min) (ACUTE ONLY): 20 min  Charges:  $Therapeutic Activity: 8-22  mins                     Ralene BatheUhlenberg, Charvi Gammage Kistler PT 02/05/2018  Acute Rehabilitation Services Pager [phone] Office [phone]

## 2018-02-05 NOTE — Progress Notes (Signed)
PT Cancellation Note  Patient Details Name: Joel PearsonDaniel Solis MRN: [id number] DOB: 1946-04-09   Cancelled Treatment:    Reason Eval/Treat Not Completed: Other (comment)(pt stated "I'm comfortable right now and don't want to get up". Will follow. )   Tamala SerUhlenberg, Lilyann Gravelle Kistler PT 02/05/2018  Acute Rehabilitation Services Pager [phone] Office [phone]

## 2018-02-05 NOTE — Consult Note (Addendum)
WOC Nurse wound consult note Reason for Consult: Consult requested for left leg wounds.  Pt states it became " bad all of a sudden. " He was reported to have maggots in the wounds upon admission, but none are noted upon assessment today Wound type: Left toes and anterior/posterior calf with patchy areas of dry scabs and eschar.  Most removes easily, when scrubbed with moist gauze, revealing red wound beds with small amt bloody drainage.  No fluctuance or odor.  Toes are pink and dry after scabs removed. Left outer leg with dark red dry partial thickness wound, 1.5X.3X.1cm Left posterior leg with full thickness wound; 2X2X.2cm, red and moist, small amt yellow drainage Left anterior wound affected area is approx 8X4X.2cm, with 20% dry scabbed areas remaining, 80% red and moist, small amt bloody drainage.  Pt medicated for pain prior to removal and cleansing but it was still very painful.  Do not feel that surgical consult is required for further debridement. Dressing procedure/placement/frequency: Aquacel to provide antimicrobial benefits and absorb drainage.  Topical treatment orders provided for bedside nurses to perform.  Pt could benefit from home health assistance for dressing changes after discharge. Please re-consult if further assistance is needed.  Thank-you,  Cammie Mcgeeawn Chanda Laperle MSN, RN, CWOCN, MerrillWCN-AP, CNS [phone]

## 2018-02-05 NOTE — Progress Notes (Signed)
PHARMACY NOTE:  ANTIMICROBIAL RENAL DOSAGE ADJUSTMENT  Current antimicrobial regimen includes a mismatch between antimicrobial dosage and estimated renal function.  As per policy approved by the Pharmacy & Therapeutics and Medical Executive Committees, the antimicrobial dosage will be adjusted accordingly.  Current antimicrobial dosage:  Cefepime 2g IV q24  Indication: cellulitis  Renal Function:  Estimated Creatinine Clearance: 69.4 mL/min (by C-G formula based on SCr of 1.04 mg/dL). []      On intermittent HD, scheduled: []      On CRRT    Antimicrobial dosage has been changed to:  Cefepime 2g IV q12  Additional comments:   Thank you for allowing pharmacy to be a part of this patient's care.  Berkley HarveyLegge, Ricci Dirocco Marshall, [facility]irmaryRPH 02/05/2018 9:37 AM

## 2018-02-06 LAB — BASIC METABOLIC PANEL
ANION GAP: 10 (ref 5–15)
BUN: 12 mg/dL (ref 8–23)
CALCIUM: 8.4 mg/dL — AB (ref 8.9–10.3)
CO2: 23 mmol/L (ref 22–32)
CREATININE: 0.86 mg/dL (ref 0.61–1.24)
Chloride: 104 mmol/L (ref 98–111)
Glucose, Bld: 95 mg/dL (ref 70–99)
Potassium: 3 mmol/L — ABNORMAL LOW (ref 3.5–5.1)
Sodium: 137 mmol/L (ref 135–145)

## 2018-02-06 LAB — CBC
HEMATOCRIT: 46.8 % (ref 39.0–52.0)
Hemoglobin: 15 g/dL (ref 13.0–17.0)
MCH: 27.3 pg (ref 26.0–34.0)
MCHC: 32.1 g/dL (ref 30.0–36.0)
MCV: 85.1 fL (ref 78.0–100.0)
Platelets: 342 10*3/uL (ref 150–400)
RBC: 5.5 MIL/uL (ref 4.22–5.81)
RDW: 13.1 % (ref 11.5–15.5)
WBC: 7.2 10*3/uL (ref 4.0–10.5)

## 2018-02-06 MED ORDER — LISINOPRIL 20 MG PO TABS
20.0000 mg | ORAL_TABLET | Freq: Every day | ORAL | Status: DC
  Administered 2018-02-06 – 2018-02-08 (×3): 20 mg via ORAL
  Filled 2018-02-06 (×3): qty 1

## 2018-02-06 MED ORDER — LISINOPRIL 20 MG PO TABS: 20.0000 mg | ORAL_TABLET | Freq: Every day | ORAL | Status: DC

## 2018-02-06 MED ORDER — CEPHALEXIN 500 MG PO CAPS
500.0000 mg | ORAL_CAPSULE | Freq: Three times a day (TID) | ORAL | Status: DC
  Administered 2018-02-06 – 2018-02-08 (×6): 500 mg via ORAL
  Filled 2018-02-06 (×6): qty 1

## 2018-02-06 MED ORDER — CEPHALEXIN 500 MG PO CAPS: 500.0000 mg | ORAL_CAPSULE | Freq: Three times a day (TID) | ORAL | 0 refills | Status: DC

## 2018-02-06 MED ORDER — POTASSIUM CHLORIDE CRYS ER 20 MEQ PO TBCR
40.0000 meq | EXTENDED_RELEASE_TABLET | Freq: Two times a day (BID) | ORAL | Status: AC
  Administered 2018-02-06 (×2): 40 meq via ORAL
  Filled 2018-02-06 (×2): qty 2

## 2018-02-06 NOTE — Discharge Instructions (Signed)

## 2018-02-06 NOTE — Discharge Summary (Signed)
Discharge Summary  Joel Solis ZOX:[id number] DOB: Jul 06, 1945  PCP: [facility]  Admit date: 02/02/2018 Discharge date: 02/06/2018  Time spent: 25 minutes  Recommendations for Outpatient Follow-up:  1. Follow-up with your PCP 2. Continue wound care 3. Continue physical therapy 4. Fall precautions   WOUND CARE SPECIALIST RECOMMENDATIONS:  Reason for Consult: Consult requested for left leg wounds.  Pt states it became " bad all of a sudden. " He was reported to have maggots in the wounds upon admission, but none are noted upon assessment today Wound type: Left toes and anterior/posterior calf with patchy areas of dry scabs and eschar.  Most removes easily, when scrubbed with moist gauze, revealing red wound beds with small amt bloody drainage.  No fluctuance or odor.  Toes are pink and dry after scabs removed. Left outer leg with dark red dry partial thickness wound, 1.5X.3X.1cm Left posterior leg with full thickness wound; 2X2X.2cm, red and moist, small amt yellow drainage Left anterior wound affected area is approx 8X4X.2cm, with 20% dry scabbed areas remaining, 80% red and moist, small amt bloody drainage.  Pt medicated for pain prior to removal and cleansing but it was still very painful.  Do not feel that surgical consult is required for further debridement. Dressing procedure/placement/frequency: Aquacel to provide antimicrobial benefits and absorb drainage.  Topical treatment orders provided for bedside nurses to perform.  Pt could benefit from home health assistance for dressing changes after discharge. Please re-consult if further assistance is needed.  Mardee Postin MSN, RN, Rober Minion, [place], [phone]  Discharge Diagnoses:  Active Hospital Problems   Diagnosis Date Noted  . Lower extremity cellulitis 02/02/2018  . Infected superficial injury of toe of right foot 02/02/2018  . Cellulitis 02/02/2018    Resolved Hospital Problems  No  resolved problems to display.    Discharge Condition: Stable  Diet recommendation: Resume previous diet  Vitals:   02/06/18 0938 02/06/18 1325  BP: (!) 152/88 (!) 162/89  Pulse: 63 66  Resp: 16 17  Temp: 98.5 F (36.9 C) 99.7 F (37.6 C)  SpO2: 98% 97%    History of present illness:   Joel Solis a 71 y.o.malewith past medical history of hypertension, status post VP shunt, hemorrhagic stroke, presenting to the ED with wound to left leg. He states his home health nurse has been caring for this wound to his left leg that he believes began about 1 month ago. He states leg is significantly painful worse with palpation. Has been taking ibuprofen for pain. Reports his nurse called him today and told him the wound culture came back positive so he needed to report to the ED for admission. He is unable to recall what the culture resulted as. He denies fever, chills, or systemic symptoms. Denies history of diabetes.     Patient was admitted on 02/02/2018 as inpatient status due to complex wounds requiring IV antibiotics, frequent wound dressing, and close monitoring in the setting of multiple comorbidities and advanced age.  Patient states wounds became bad all of a sudden and reported to have maggots in the wound upon admission.  02/05/2018: Moderate pain in his left lower extremity on exam.  Wound care nurse specialist consulted and following with recommendations.  02/06/2018: Patient seen and examined at his bedside.  States left leg feels better.  Has no new complaints.  On the day of discharge, the patient was hemodynamically stable.  He will need to follow-up with his primary care provider and  continue wound care posthospitalization.  Hospital Course:  Active Problems:   Lower extremity cellulitis   Infected superficial injury of toe of right foot   Cellulitis  Lower extremity cellulitis complicated by chronic left lower extremity ulcer Switch oral antibiotics Keflex 500  mg 3 times daily x5 days Wound care specialist consulted Continue wound dressing per wound care especially recommendations Continue wound care at home Follow-up with your PCP  Hypertension Blood pressure stable Continue lisinopril, Lopressor, amlodipine  Hypokalemia Potassium 3.0 Repleted with p.o. potassium 40 mEq once x2 Follow-up with your PCP  Code Status: Full code  Family Communication: None at bedside  Disposition Plan: Home in 1 to 2 days when clinically stable   Consultants:  Wound care specialist  General surgery   Antimicrobials:  Completed 5 days of IV cefepime   Discharge Exam: BP (!) 162/89 (BP Location: Right Arm)   Pulse 66   Temp 99.7 F (37.6 C) (Oral)   Resp 17   Ht 5\' 8"  (1.727 m)   Wt 88.5 kg   SpO2 97%   BMI 29.65 kg/m  . General: 72 y.o. year-old male well developed well nourished in no acute distress.  Alert and oriented x3. . Cardiovascular: Regular rate and rhythm with no rubs or gallops.  No thyromegaly or JVD noted.   Marland Kitchen Respiratory: Clear to auscultation with no wheezes or rales. Good inspiratory effort. . Abdomen: Soft nontender nondistended with normal bowel sounds x4 quadrants. . Psychiatry: Mood is appropriate for condition and setting  Discharge Instructions You were cared for by a hospitalist during your hospital stay. If you have any questions about your discharge medications or the care you received while you were in the hospital after you are discharged, you can call the unit and asked to speak with the hospitalist on call if the hospitalist that took care of you is not available. Once you are discharged, your primary care physician will handle any further medical issues. Please note that NO REFILLS for any discharge medications will be authorized once you are discharged, as it is imperative that you return to your primary care physician (or establish a relationship with a primary care physician if you do not have one)  for your aftercare needs so that they can reassess your need for medications and monitor your lab values.   Allergies as of 02/06/2018      Reactions   Shellfish Allergy Nausea And Vomiting      Medication List    STOP taking these medications   ibuprofen 200 MG tablet Commonly known as:  ADVIL,MOTRIN   lactulose 10 GM/15ML solution Commonly known as:  CHRONULAC     TAKE these medications   amLODipine 10 MG tablet Commonly known as:  NORVASC Take 10 mg by mouth daily.   atorvastatin 40 MG tablet Commonly known as:  LIPITOR Take 40 mg by mouth daily.   baclofen 10 MG tablet Commonly known as:  LIORESAL Take 10 mg by mouth 3 (three) times daily.   cephALEXin 500 MG capsule Commonly known as:  KEFLEX Take 1 capsule (500 mg total) by mouth every 8 (eight) hours.   furosemide 20 MG tablet Commonly known as:  LASIX Take 20 mg by mouth daily.   lisinopril 20 MG tablet Commonly known as:  PRINIVIL,ZESTRIL Take 20 mg by mouth daily.   metoprolol tartrate 100 MG tablet Commonly known as:  LOPRESSOR Take 100 mg by mouth 2 (two) times daily.   SALONPAS ARTHRITIS PAIN RELIEF EX  Apply 1 patch topically daily as needed (leg pain).      Allergies  Allergen Reactions  . Shellfish Allergy Nausea And Vomiting   Follow-up Information    [facility]. Call in 1 day(s).   Specialty:  Family Medicine Why:  Please call for follow-up appointment. Contact information: [address] Dorothyann Gibbs [facility] [place] [address] [phone]            The results of significant diagnostics from this hospitalization (including imaging, microbiology, ancillary and laboratory) are listed below for reference.    Significant Diagnostic Studies: Dg Tibia/fibula Left  Result Date: 02/02/2018 CLINICAL DATA:  Left leg cellulitis and pain. EXAM: LEFT TIBIA AND FIBULA - 2 VIEW COMPARISON:  None. FINDINGS: Diffuse soft tissue swelling. No bone destruction or  periosteal reaction. No soft tissue gas. Diffuse osteopenia. Mild atheromatous arterial calcifications. IMPRESSION: Diffuse soft tissue swelling without radiographic evidence of underlying osteomyelitis. Electronically Signed   By: Beckie Salts M.D.   On: 02/02/2018 13:58   Dg Foot Complete Left  Result Date: 02/02/2018 CLINICAL DATA:  Left leg and foot cellulitis. EXAM: LEFT FOOT - COMPLETE 3+ VIEW COMPARISON:  None. FINDINGS: Diffuse soft tissue swelling and diffuse osteopenia. No soft tissue gas, bone destruction or periosteal reaction. IMPRESSION: Diffuse soft tissue swelling and diffuse osteopenia without radiographic evidence of underlying osteomyelitis. Electronically Signed   By: Beckie Salts M.D.   On: 02/02/2018 13:59    Microbiology: Recent Results (from the past 240 hour(s))  Culture, blood (routine x 2)     Status: None (Preliminary result)   Collection Time: 02/02/18  1:40 PM  Result Value Ref Range Status   Specimen Description   Final    BLOOD BLOOD RIGHT FOREARM Performed at [facility], [address] [place], [place] [address]    Special Requests   Final    BOTTLES DRAWN AEROBIC AND ANAEROBIC Blood Culture adequate volume Performed at [facility], [address], [place], [place] [address]    Culture   Final    NO GROWTH 4 DAYS Performed at [facility], [address], [place], [place] [address]    Report Status PENDING  Incomplete  Culture, blood (routine x 2)     Status: None (Preliminary result)   Collection Time: 02/02/18  1:40 PM  Result Value Ref Range Status   Specimen Description   Final    BLOOD RIGHT ARM Performed at [facility], [address] [place], [place] [address]    Special Requests   Final    BOTTLES DRAWN AEROBIC AND ANAEROBIC Blood Culture adequate volume Performed at [facility], [address], [place], [place] [address]    Culture   Final    NO  GROWTH 4 DAYS Performed at [facility], [address], [place], [place] [address]    Report Status PENDING  Incomplete  MRSA PCR Screening     Status: None   Collection Time: 02/02/18  7:37 PM  Result Value Ref Range Status   MRSA by PCR NEGATIVE NEGATIVE Final    Comment:        The GeneXpert MRSA Assay (FDA approved for NASAL specimens only), is one component of a comprehensive MRSA colonization surveillance program. It is not intended to diagnose MRSA infection nor to guide or monitor treatment for MRSA infections. Performed at [facility], [address], [place], [place] [address]  Labs: Basic Metabolic Panel: Recent Labs  Lab 02/02/18 1136 02/03/18 0411 02/05/18 0357 02/06/18 0426  NA 142 139 139 137  K 3.4* 2.9* 3.3* 3.0*  CL 105 106 106 104  CO2 28 26 25 23   GLUCOSE 89 92 90 95  BUN 31* 23 15 12   CREATININE 1.60* 1.35* 1.04 0.86  CALCIUM 8.7* 7.2* 8.4* 8.4*  MG  --   --  2.0  --    Liver Function Tests: Recent Labs  Lab 02/02/18 1136 02/03/18 0411  AST 15 12*  ALT 12 8  ALKPHOS 142* 113  BILITOT 0.9 0.5  PROT 7.9 6.2*  ALBUMIN 3.4* 2.6*   No results for input(s): LIPASE, AMYLASE in the last 168 hours. No results for input(s): AMMONIA in the last 168 hours. CBC: Recent Labs  Lab 02/02/18 1136 02/03/18 0411 02/05/18 0357 02/06/18 0426  WBC 8.4 7.9 7.4 7.2  NEUTROABS 4.8  --  4.3  --   HGB 15.7 13.5 14.5 15.0  HCT 48.4 43.5 45.6 46.8  MCV 86.1 87.0 85.7 85.1  PLT 340 307 326 342   Cardiac Enzymes: No results for input(s): CKTOTAL, CKMB, CKMBINDEX, TROPONINI in the last 168 hours. BNP: BNP (last 3 results) No results for input(s): BNP in the last 8760 hours.  ProBNP (last 3 results) No results for input(s): PROBNP in the last 8760 hours.  CBG: No results for input(s): GLUCAP in the last 168 hours.     Signed:  Darlin Droparole N Donat Humble, MD [facility] 02/06/2018, 1:37 PM

## 2018-02-06 NOTE — Progress Notes (Signed)
Discharge instructions reviewed with patient. Questions answered and patient denies further questions. One prescription given to patient. [facility] called for transport. Lina SarBeth Travarus Trudo, RN

## 2018-02-06 NOTE — Progress Notes (Signed)
Pt suppose to be discharge today . [facility] came to pickup pt  But  PT[facility] declined to take pt  From Hospital  to his house due to safety concern. Pt even could not transfer from bed to stretcher, Need lots of help.Pt said nobody going to be at home to help him , when he gets at home. .It's not safe to leave pt at home without anybody.  Pt said he has one cat at home and he need to cook his food. . Hemiplegia on his left side. oncall floor coverage notified. Will continue to monitor .

## 2018-02-06 NOTE — Care Management Note (Signed)
Case Management Note  Patient Details  Name: Joel PearsonDaniel Denard MRN: [id number] Date of Birth: [birth year]  Subjective/Objective:Patient declines SNF-Wants home w/HHC-already active w/[facility]-rep Kathlene NovemberMike aware of HHC orders, & d/c today. [facility] for non emergency ambulance transp forms in shadow chart for Nsg to call when ready-confirmed address. Patient has own house keys, & no one else in the home.Home alone but safe, No further CM needs.                    Action/Plan:d/c home w/HHC/PTAR.   Expected Discharge Date:  02/06/18               Expected Discharge Plan:  Home w Home Health Services  In-House Referral:  NA  Discharge planning Services  CM Consult  Post Acute Care Choice:  Home Health, Resumption of Svcs/PTA Provider, Durable Medical Equipment(Active w/[facility]-HHRN/PT/OT/aide,sw;w/c.) Choice offered to:  Patient  DME Arranged:  N/A DME Agency:  NA  HH Arranged:  RN, PT, Nurse's Aide, Social Work, Refused SNF [facility]  Status of Service:  Completed, signed off  If discussed at [employer] Meetings, dates discussed:    Additional Comments:  Lanier ClamMahabir, Aeva Posey, RN 02/06/2018, 2:40 PM

## 2018-02-07 LAB — CULTURE, BLOOD (ROUTINE X 2)
CULTURE: NO GROWTH
Culture: NO GROWTH
Special Requests: ADEQUATE
Special Requests: ADEQUATE

## 2018-02-07 LAB — POTASSIUM: Potassium: 3.9 mmol/L (ref 3.5–5.1)

## 2018-02-07 LAB — AMMONIA: Ammonia: 34 umol/L (ref 9–35)

## 2018-02-07 NOTE — [place] FL2 (Addendum)
[place] MEDICAID FL2 LEVEL OF CARE SCREENING TOOL     IDENTIFICATION  Patient Name: Joel Solis Birthdate: 05-Jan-1946 Sex: male Admission Date (Current Location): 02/02/2018  [facility] [place]   Facility and Address:  [facility],  [address], [place] [id number]      Provider Number: [id number]  Attending Physician Name and Address:  Darlin DropHall, Carole N, DO  Relative Name and Phone Number:       Current Level of Care: Hospital Recommended Level of Care: Skilled Nursing Facility Prior Approval Number:    Date Approved/Denied:   PASRR Number:   [id number]   Discharge Plan: SNF    Current Diagnoses: Patient Active Problem List   Diagnosis Date Noted  . Lower extremity cellulitis 02/02/2018  . Infected superficial injury of toe of right foot 02/02/2018  . Cellulitis 02/02/2018  . AKI (acute kidney injury) (HCC) 11/22/2016  . Inneffective medication administration routine at home 11/22/2016  . Dirty living conditions 11/22/2016  . Elevated troponin 11/16/2016  . Acute metabolic encephalopathy 11/15/2016  . Hypokalemia 11/15/2016  . Essential hypertension   . HLD (hyperlipidemia)   . 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 S/P VP shunt   . Tobacco abuse     Orientation RESPIRATION BLADDER Height & Weight     Self, Time, Situation, Place  Normal Continent Weight: 195 lb (88.5 kg) Height:  5\' 8"  (172.7 cm)  BEHAVIORAL SYMPTOMS/MOOD NEUROLOGICAL BOWEL NUTRITION STATUS      Continent Diet(Regular Diet )  AMBULATORY STATUS COMMUNICATION OF NEEDS Skin   Extensive Assist   Leg Wounds                        Personal Care Assistance Level of Assistance  Bathing, Feeding, Dressing Bathing Assistance: Limited assistance Feeding assistance: Independent Dressing Assistance: Limited assistance     Functional Limitations Info  Sight, Hearing, Speech Sight Info: Adequate Hearing  Info: Adequate Speech Info: Adequate    SPECIAL CARE FACTORS FREQUENCY  PT (By licensed PT), OT (By licensed OT)     PT Frequency: 5x/week  OT Frequency: 5x/week             Contractures Contractures Info: Not present    Additional Factors Info  Code Status, Allergies, Psychotropic Code Status Info: Fullcode  Allergies Info: Shellfish Allergy Psychotropic Info: tramadol           Current Medications (02/07/2018):  This is the current hospital active medication list Current Facility-Administered Medications  Medication Dose Route Frequency Provider Last Rate Last Dose  . amLODipine (NORVASC) tablet 10 mg  10 mg Oral Daily Rhetta MuraSamtani, Jai-Gurmukh, MD   10 mg at 02/07/18 1041  . atorvastatin (LIPITOR) tablet 40 mg  40 mg Oral Daily Rhetta MuraSamtani, Jai-Gurmukh, MD   40 mg at 02/07/18 1041  . baclofen (LIORESAL) tablet 10 mg  10 mg Oral TID Rhetta MuraSamtani, Jai-Gurmukh, MD   10 mg at 02/07/18 1041  . cephALEXin (KEFLEX) capsule 500 mg  500 mg Oral Q8H Hall, Carole N, DO   500 mg at 02/07/18 0524  . enoxaparin (LOVENOX) injection 40 mg  40 mg Subcutaneous Q24H Rhetta MuraSamtani, Jai-Gurmukh, MD   40 mg at 02/06/18 2233  . HYDROcodone-acetaminophen (NORCO/VICODIN) 5-325 MG per tablet 1-2 tablet  1-2 tablet Oral Q6H PRN Schorr, Roma KayserKatherine P, NP   2 tablet at 02/04/18 1643  . lisinopril (PRINIVIL,ZESTRIL) tablet 20 mg  20 mg Oral Daily [place] ParkHall,  Carole N, DO   20 mg at 02/07/18 1041  . metoprolol tartrate (LOPRESSOR) tablet 100 mg  100 mg Oral BID Rhetta Mura, MD   100 mg at 02/07/18 1041  . traMADol (ULTRAM) tablet 50 mg  50 mg Oral Q6H Rhetta Mura, MD   50 mg at 02/07/18 1610     Discharge Medications: Please see discharge summary for a list of discharge medications.  Relevant Imaging Results:  Relevant Lab Results:   Additional Information ssn:[phone]  Clearance Coots, LCSW

## 2018-02-07 NOTE — Progress Notes (Signed)
Per Med Advisor-Needing patient agreement to appeal denial in patient's behalf-form signed by patient-will continue to asst w/process as needed. Noted per CSW patient now agrees to SNF.

## 2018-02-07 NOTE — Progress Notes (Signed)
Discharge Summary  Joel Solis UEA:[id number] DOB: 07-06-1945  PCP: [facility]  Admit date: 02/02/2018 Discharge date: 02/07/2018  Time spent: 25 minutes  UPDATE: Discharge delayed on 02/06/2018 due to patient unable to care for himself at home.  SNF was recommended prior to discharge however patient declined.  On 02/07/2018 patient is now agreeable to going to SNF for physical rehab.  02/07/2018: Patient seen and examined at his bedside.  No acute events overnight.  He has no new complaints.  Social worker is working on bed placement to SNF.  Recommendations for Outpatient Follow-up:  1. Follow-up with your PCP 2. Continue wound care 3. Continue physical therapy 4. Fall precautions   WOUND CARE SPECIALIST RECOMMENDATIONS:  Reason for Consult: Consult requested for left leg wounds.  Pt states it became " bad all of a sudden. " He was reported to have maggots in the wounds upon admission, but none are noted upon assessment today Wound type: Left toes and anterior/posterior calf with patchy areas of dry scabs and eschar.  Most removes easily, when scrubbed with moist gauze, revealing red wound beds with small amt bloody drainage.  No fluctuance or odor.  Toes are pink and dry after scabs removed. Left outer leg with dark red dry partial thickness wound, 1.5X.3X.1cm Left posterior leg with full thickness wound; 2X2X.2cm, red and moist, small amt yellow drainage Left anterior wound affected area is approx 8X4X.2cm, with 20% dry scabbed areas remaining, 80% red and moist, small amt bloody drainage.  Pt medicated for pain prior to removal and cleansing but it was still very painful.  Do not feel that surgical consult is required for further debridement. Dressing procedure/placement/frequency: Aquacel to provide antimicrobial benefits and absorb drainage.  Topical treatment orders provided for bedside nurses to perform.  Pt could benefit from home health assistance for  dressing changes after discharge. Please re-consult if further assistance is needed.  Mardee Postin MSN, RN, [facility]ion, [place], [place] [phone]  Discharge Diagnoses:  Active Hospital Problems   Diagnosis Date Noted  . Lower extremity cellulitis 02/02/2018  . Infected superficial injury of toe of right foot 02/02/2018  . Cellulitis 02/02/2018    Resolved Hospital Problems  No resolved problems to display.    Discharge Condition: Stable  Diet recommendation: Resume previous diet  Vitals:   02/06/18 2206 02/07/18 0522  BP: (!) 152/76 (!) 150/87  Pulse: 66 70  Resp: 16 18  Temp: 98 F (36.7 C) 98 F (36.7 C)  SpO2: 96% 96%    History of present illness:   Joel Solis a 72 y.o.malewith past medical history of hypertension, status post VP shunt, hemorrhagic stroke, presenting to the ED with wound to left leg. He states his home health nurse has been caring for this wound to his left leg that he believes began about 1 month ago. He states leg is significantly painful worse with palpation. Has been taking ibuprofen for pain. Reports his nurse called him today and told him the wound culture came back positive so he needed to report to the ED for admission. He is unable to recall what the culture resulted as. He denies fever, chills, or systemic symptoms. Denies history of diabetes.     Patient was admitted on 02/02/2018 as inpatient status due to complex wounds requiring IV antibiotics, frequent wound dressing, and close monitoring in the setting of multiple comorbidities and advanced age.  Patient states wounds became bad all of a sudden and reported to have  maggots in the wound upon admission.  02/05/2018: Moderate pain in his left lower extremity on exam.  Wound care nurse specialist consulted and following with recommendations.  02/06/2018: Patient seen and examined at his bedside.  States left leg feels better.  Has no new complaints.  On the day of discharge, the  patient was hemodynamically stable.  He will need to follow-up with his primary care provider and continue wound care posthospitalization.  Hospital Course:  Active Problems:   Lower extremity cellulitis   Infected superficial injury of toe of right foot   Cellulitis  Lower extremity cellulitis complicated by chronic left lower extremity ulcer Switch oral antibiotics Keflex 500 mg 3 times daily x5 days Wound care specialist consulted Continue wound dressing per wound care especially recommendations Continue wound care at home Follow-up with your PCP  Hypertension Blood pressure stable Continue lisinopril, Lopressor, amlodipine  Hypokalemia Potassium 3.0 Repleted with p.o. potassium 40 mEq once x2 Follow-up with your PCP  Code Status: Full code  Family Communication: None at bedside  Disposition Plan: Home in 1 to 2 days when clinically stable   Consultants:  Wound care specialist  General surgery   Antimicrobials:  Completed 5 days of IV cefepime   Discharge Exam: BP (!) 150/87 (BP Location: Right Arm)   Pulse 70   Temp 98 F (36.7 C) (Oral)   Resp 18   Ht 5\' 8"  (1.727 m)   Wt 88.5 kg   SpO2 96%   BMI 29.65 kg/m  . General: 72 y.o. year-old male well-developed well-nourished in no acute distress.  Alert and oriented x3.   . Cardiovascular: Regular rate and rhythm with no rubs or gallops.  No thyromegaly or JVD noted.   Marland Kitchen Respiratory: Clear to auscultation with no wheezes or rales. Good inspiratory effort. . Abdomen: Soft nontender nondistended with normal bowel sounds x4 quadrants. . Psychiatry: Mood is appropriate for condition and setting  Discharge Instructions You were cared for by a hospitalist during your hospital stay. If you have any questions about your discharge medications or the care you received while you were in the hospital after you are discharged, you can call the unit and asked to speak with the hospitalist on call if the  hospitalist that took care of you is not available. Once you are discharged, your primary care physician will handle any further medical issues. Please note that NO REFILLS for any discharge medications will be authorized once you are discharged, as it is imperative that you return to your primary care physician (or establish a relationship with a primary care physician if you do not have one) for your aftercare needs so that they can reassess your need for medications and monitor your lab values.   Allergies as of 02/07/2018      Reactions   Shellfish Allergy Nausea And Vomiting      Medication List    STOP taking these medications   ibuprofen 200 MG tablet Commonly known as:  ADVIL,MOTRIN   lactulose 10 GM/15ML solution Commonly known as:  CHRONULAC     TAKE these medications   amLODipine 10 MG tablet Commonly known as:  NORVASC Take 10 mg by mouth daily.   atorvastatin 40 MG tablet Commonly known as:  LIPITOR Take 40 mg by mouth daily.   baclofen 10 MG tablet Commonly known as:  LIORESAL Take 10 mg by mouth 3 (three) times daily.   cephALEXin 500 MG capsule Commonly known as:  KEFLEX Take 1 capsule (500  mg total) by mouth every 8 (eight) hours.   furosemide 20 MG tablet Commonly known as:  LASIX Take 20 mg by mouth daily.   lisinopril 20 MG tablet Commonly known as:  PRINIVIL,ZESTRIL Take 20 mg by mouth daily.   metoprolol tartrate 100 MG tablet Commonly known as:  LOPRESSOR Take 100 mg by mouth 2 (two) times daily.   SALONPAS ARTHRITIS PAIN RELIEF EX Apply 1 patch topically daily as needed (leg pain).      Allergies  Allergen Reactions  . Shellfish Allergy Nausea And Vomiting   Follow-up Information    [facility]. Call in 1 day(s).   Specialty:  Family Medicine Why:  Please call for follow-up appointment. Contact information: [address] [place] [address] [phone]        [facility] Follow  up.   Specialty:  Home Health Services Why:  [facility] nursing/physical/occupational therapy/aide,social worker Contact information: [address] [place] [address] [phone]            The results of significant diagnostics from this hospitalization (including imaging, microbiology, ancillary and laboratory) are listed below for reference.    Significant Diagnostic Studies: Dg Tibia/fibula Left  Result Date: 02/02/2018 CLINICAL DATA:  Left leg cellulitis and pain. EXAM: LEFT TIBIA AND FIBULA - 2 VIEW COMPARISON:  None. FINDINGS: Diffuse soft tissue swelling. No bone destruction or periosteal reaction. No soft tissue gas. Diffuse osteopenia. Mild atheromatous arterial calcifications. IMPRESSION: Diffuse soft tissue swelling without radiographic evidence of underlying osteomyelitis. Electronically Signed   By: Beckie Salts M.D.   On: 02/02/2018 13:58   Dg Foot Complete Left  Result Date: 02/02/2018 CLINICAL DATA:  Left leg and foot cellulitis. EXAM: LEFT FOOT - COMPLETE 3+ VIEW COMPARISON:  None. FINDINGS: Diffuse soft tissue swelling and diffuse osteopenia. No soft tissue gas, bone destruction or periosteal reaction. IMPRESSION: Diffuse soft tissue swelling and diffuse osteopenia without radiographic evidence of underlying osteomyelitis. Electronically Signed   By: Beckie Salts M.D.   On: 02/02/2018 13:59    Microbiology: Recent Results (from the past 240 hour(s))  Culture, blood (routine x 2)     Status: None   Collection Time: 02/02/18  1:40 PM  Result Value Ref Range Status   Specimen Description   Final    BLOOD BLOOD RIGHT FOREARM Performed at [facility], [address], [place], [place] [address]    Special Requests   Final    BOTTLES DRAWN AEROBIC AND ANAEROBIC Blood Culture adequate volume Performed at [facility], [address], [place], [place] [address]    Culture   Final    NO GROWTH 5 DAYS Performed at [facility], [address], [place], [place] [address]    Report Status 02/07/2018 FINAL  Final  Culture, blood (routine x 2)     Status: None   Collection Time: 02/02/18  1:40 PM  Result Value Ref Range Status   Specimen Description   Final    BLOOD RIGHT ARM Performed at [facility], [address] [place], [place] [address]    Special Requests   Final    BOTTLES DRAWN AEROBIC AND ANAEROBIC Blood Culture adequate volume Performed at [facility], [address], [place], [place] [address]    Culture   Final    NO GROWTH 5 DAYS Performed at [facility], [address], [place], [place]  [id number]    Report Status 02/07/2018 FINAL  Final  MRSA PCR Screening     Status: None   Collection Time: 02/02/18  7:37 PM  Result Value Ref Range Status   MRSA by PCR NEGATIVE NEGATIVE Final    Comment:        The GeneXpert MRSA Assay (FDA approved for NASAL specimens only), is one component of a comprehensive MRSA colonization surveillance program. It is not intended to diagnose MRSA infection nor to guide or monitor treatment for MRSA infections. Performed at [facility], [address] [place], [place] [id number]      Labs: Basic Metabolic Panel: Recent Labs  Lab 02/02/18 1136 02/03/18 0411 02/05/18 0357 02/06/18 0426 02/07/18 0644  NA 142 139 139 137  --   K 3.4* 2.9* 3.3* 3.0* 3.9  CL 105 106 106 104  --   CO2 28 26 25 23   --   GLUCOSE 89 92 90 95  --   BUN 31* 23 15 12   --   CREATININE 1.60* 1.35* 1.04 0.86  --   CALCIUM 8.7* 7.2* 8.4* 8.4*  --   MG  --   --  2.0  --   --    Liver Function Tests: Recent Labs  Lab 02/02/18 1136 02/03/18 0411  AST 15 12*  ALT 12 8  ALKPHOS 142* 113  BILITOT 0.9 0.5  PROT 7.9 6.2*  ALBUMIN 3.4* 2.6*   No results for input(s): LIPASE, AMYLASE in the last 168 hours. Recent Labs  Lab 02/07/18 0644  AMMONIA 34   CBC: Recent Labs  Lab 02/02/18 1136  02/03/18 0411 02/05/18 0357 02/06/18 0426  WBC 8.4 7.9 7.4 7.2  NEUTROABS 4.8  --  4.3  --   HGB 15.7 13.5 14.5 15.0  HCT 48.4 43.5 45.6 46.8  MCV 86.1 87.0 85.7 85.1  PLT 340 307 326 342   Cardiac Enzymes: No results for input(s): CKTOTAL, CKMB, CKMBINDEX, TROPONINI in the last 168 hours. BNP: BNP (last 3 results) No results for input(s): BNP in the last 8760 hours.  ProBNP (last 3 results) No results for input(s): PROBNP in the last 8760 hours.  CBG: No results for input(s): GLUCAP in the last 168 hours.     Signed:  Darlin Droparole N Emile Kyllo, MD [facility] 02/07/2018, 11:58 AM

## 2018-02-07 NOTE — Evaluation (Signed)
Physical Therapy Evaluation Patient Details Name: Joel Solis MRN: [id number] DOB: 1945-10-17 Today's Date: 02/07/2018   History of Present Illness  72yo male arriving to ED with LE edema and maggots in LE wounds. Diagnosed with sepsis due to wound infection. PMH HTN, chronic LE wounds, CVA 2011, CKD, VP shunt   Clinical Impression   Pt re-evaled this session. Pt presents with LUE hemiplegia with flexor synergy and shoulder subluxation, difficulty performing bed mobility/transfers, LLE pain, LLE extensor tone and hemiparesis, and decreased balance in sitting and standing. Pt to benefit from continued PT to address deficits. Pt able to perform all bed mobility and transfers bed<>recliner today with mod assist +2. Pt requires physical assist for all mobility and would benefit from SNF to receive rehabilitation. Pt now open to this option. Will continue to follow acutely.     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 Pt with use of bedrails to mobilize. Pt reminded to not let anyone assisting him with mobility pull on his LUE, pt reporting subluxation.   Transfers Overall transfer level: Needs assistance Equipment used: Hemi-walker Transfers: Pharmacologist;Sit to/from Stand Sit to Stand: Mod assist;From elevated surface;+2 safety/equipment Stand pivot transfers: Mod assist Squat pivot transfers: Mod  assist;+2 physical assistance;+2 safety/equipment     General transfer comment: Mod assist for sit to stand for trunk elevation, LLE guarding. Pt relying on hemi-walker held in RUE to push himself to standing. Stand pivot transfer to drop arm recliner, recliner located on the R of pt to transfer to the strong side (pt with L hemiplegia). During stand pivot transfer, pt with difficulty progressing LLE forward. PT provided knee flexion and DF assist to progress LLE. Pt reports pain with LLE handling. Pt took 3 steps to get to recliner. After rest, pt transferred back to bed, once again requiring mod assist to get back to bed. Pt with more difficulty transferring to L side due to hemiplegia.    Ambulation/Gait                Stairs            Wheelchair Mobility    Modified Rankin (Stroke Patients Only)       Balance Overall balance assessment: Needs assistance;History of Falls Sitting-balance support: Feet supported;No upper extremity supported Sitting balance-Leahy Scale: Fair       Standing balance-Leahy Scale: Poor Standing balance comment: relies on PT assist and hemi-walker in standing for balance, LLE unpredictable                              Pertinent Vitals/Pain Pain Assessment: Faces Faces Pain Scale: Hurts little more Pain Location: LLE wounds, with mobility  Pain Descriptors / Indicators: Aching;Sore Pain Intervention(s): Limited activity within patient's tolerance;Repositioned;Monitored during session    Home Living Family/patient expects to be discharged to:: Skilled nursing facility Living  Arrangements: Alone             Home Equipment: Wheelchair - power;Tub bench;Adaptive equipment;Hospital bed(hemi walker; all equipment at home )      Prior Function Level of Independence: Independent with assistive device(s)         Comments: uses transfer pole into and out of bed, hemi walker to assist with transfers. Also had home health RN  when at home.      Hand Dominance   Dominant Hand: Right    Extremity/Trunk Assessment   Upper Extremity Assessment Upper Extremity Assessment: Defer to OT evaluation;LUE deficits/detail LUE Deficits / Details: gross hemiparesis and contracture L UE from chronic CVA; pt reports L shoulder subluxation     Lower Extremity Assessment Lower Extremity Assessment: LLE deficits/detail;Generalized weakness LLE Deficits / Details: 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, 2+/5 strength hip flexion, knee extension, ankle DF. 2-/5 strength abd/addutors. Pt with some extensor spasticity noted during mobility. Difficult to break knee extension in standing.     Cervical / Trunk Assessment Cervical / Trunk Assessment: Kyphotic  Communication   Communication: No difficulties  Cognition Arousal/Alertness: Awake/alert Behavior During Therapy: WFL for tasks assessed/performed Overall Cognitive Status: Within Functional Limits for tasks assessed                                        General Comments      Exercises     Assessment/Plan    PT Assessment Patient needs continued PT services  PT Problem List Decreased strength;Decreased mobility;Decreased safety awareness;Decreased coordination;Decreased activity tolerance;Decreased balance;Pain;Decreased knowledge of use of DME       PT Treatment Interventions DME instruction;Therapeutic activities;Gait training;Therapeutic exercise;Patient/family education;Balance training;Wheelchair mobility training;Functional mobility training;Neuromuscular re-education    PT Goals (Current goals can be found in the Care Plan section)  Acute Rehab PT Goals PT Goal Formulation: With patient Time For Goal Achievement: 02/17/18 Potential to Achieve Goals: Fair    Frequency Min 2X/week   Barriers to discharge        Co-evaluation               AM-PAC PT "6 Clicks" Daily Activity  Outcome Measure Difficulty turning over in bed  (including adjusting bedclothes, sheets and blankets)?: Unable Difficulty moving from lying on back to sitting on the side of the bed? : Unable Difficulty sitting down on and standing up from a chair with arms (e.g., wheelchair, bedside commode, etc,.)?: Unable Help needed moving to and from a bed to chair (including a wheelchair)?: A Lot Help needed walking in hospital room?: A Lot Help needed climbing 3-5 steps with a railing? : Total 6 Click Score: 8    End of Session Equipment Utilized During Treatment: Gait belt Activity Tolerance: Patient tolerated treatment well Patient left: with call bell/phone within reach;in bed Nurse Communication: Mobility status PT Visit Diagnosis: Unsteadiness on feet (R26.81);Hemiplegia and hemiparesis Hemiplegia - Right/Left: Left Hemiplegia - dominant/non-dominant: Non-dominant Hemiplegia - caused by: Cerebral infarction(old CVA 2011 )    Time: [id number] PT Time Calculation (min) (ACUTE ONLY): 35 min   Charges:   PT Evaluation $PT Re-evaluation: 1 Re-eval PT Treatments $Therapeutic Activity: 8-22 mins        Zykia Walla Terrial Rhodes Marine Lezotte, PT Acute Rehabilitation Services Pager [phone]  Office [phone]   Joselinne Lawal D Despina Hiddenure 02/07/2018, 1:54 PM

## 2018-02-07 NOTE — Progress Notes (Addendum)
CSW met with the patient at bedside to discuss discharge plan. Patient still prefers to go home. Patient reports he can manage at home alone as he attempted to explain to [facility] yesterday. Patient reports he uses a motorized wheelchair, Home Health  arranged by hospital case manager and his daughter that visits during the evening. Patient reports he is safe at home.  CSW called for [facility] to transport the patient home.  [facility] arrived and patient informed nurse " I change my mind." Patient reports he spoke to his daughter and she encouraged him to go to SNF.  CSW called and informed physician. She has placed PT consult.   CSW will continue to assist discharge planning.     Kathrin Greathouse, Marlinda Mike, MSW Clinical Social Worker  [phone] 02/07/2018  9:52 AM

## 2018-02-07 NOTE — Clinical Social Work Note (Signed)
Clinical Social Work Assessment  Patient Details  Name: Joel Solis MRN: [id number] Date of Birth: [birth year]  Date of referral:  02/07/18               Reason for consult:  Facility Placement                Permission sought to share information with:  Facility Art therapist granted to share information::  Yes, Verbal Permission Granted  Name::        Agency::     Relationship::     Contact Information:     Housing/Transportation Living arrangements for the past 2 months:   Own apartment with cat. Patient relocated from [place] to [place] where adult daughter lives. Source of Information:   Patient Patient Interpreter Needed:   None Criminal Activity/Legal Involvement Pertinent to Current: No  Significant Relationships:  Adult Children, daughter Lives with:  Self, pet Do you feel safe going back to the place where you live?  No(Not at this time). Original plan to discharge home, but is pursuing SNF placement to regain strength. Need for family participation in patient care:  No (Coment)  Care giving concerns:   Patient treated for LE edema and sepsis. Patient originally planned to discharge home with home health and adult daughter to check in with patient for further assistance. Patient is seeking a SNF placement at this time.   Social Worker assessment / plan:    CSW met with patient at bedside to discuss discharge plans, patient has decided to pursue a SNF placement. Patient has relocated from [place] to [place] to be closer to his adult daughter. He lives in an apartment with his cat and has home health services in place. Patient's daughter comes to see him and offer assistance approximately 2-3 times per week. Patient uses a power wheel chair and walker at baseline.  CSW presented patient with SNF bed offers and the patient has elected to go to [facility] for SNF, as it is close to his daughter's home.  CSW to complete FL2 and PASSR.  Employment status:   Retired Nurse, adult PT Recommendations:  [place] / Referral to community resources:  [place]  Patient/Family's Response to care:   Patient appreciative of CSW intervention and is agreeable to SNF.  Patient/Family's Understanding of and Emotional Response to Diagnosis, Current Treatment, and Prognosis:   The patient originally planned to discharge home, but after speaking with his daughter he has decided to go to SNF to regain his strength before returning home.   Emotional Assessment Appearance:  Appears stated age Attitude/Demeanor/Rapport:    Affect (typically observed):  Adaptable, Accepting Orientation:  Oriented to Self, Oriented to Place, Oriented to  Time, Oriented to Situation Alcohol / Substance use:  Not Applicable Psych involvement (Current and /or in the community):  No (Comment)  Discharge Needs  Concerns to be addressed:  Home Safety Concerns Readmission within the last 30 days:  No Current discharge risk:  Lives alone Barriers to Discharge:  [place], [place] 02/07/2018, 2:02 PM

## 2018-02-07 NOTE — Progress Notes (Signed)
Patient and [facility] agree[facility]. CSW will inform medical team when authorization has been received.   Vivi BarrackNicole Britini Garcilazo, Alexander MtLCSW, MSW Clinical Social Worker  [phone] 02/07/2018  2:20 PM

## 2018-02-08 MED ORDER — CEPHALEXIN 500 MG PO CAPS: 500.0000 mg | ORAL_CAPSULE | Freq: Three times a day (TID) | ORAL | 0 refills | Status: AC

## 2018-02-08 MED ORDER — LACTULOSE 10 GM/15ML PO SOLN: 20.0000 g | Freq: Every day | ORAL | 1 refills | Status: DC

## 2018-02-08 NOTE — Progress Notes (Signed)
Patient transferred to SNF via [facility]. All belongings w/ patient. [facility].

## 2018-02-08 NOTE — Progress Notes (Signed)
Discharge Summary  Joel Solis ZOX:[id number] DOB: 1945/08/06  [facility]P: [facility]  Admit date: 02/02/2018 Discharge date: 02/08/2018  Time spent: 25 minutes  UPDATE: Discharge delayed on 02/06/2018 due to patient unable to care for himself at home.  SNF was recommended prior to discharge however patient declined.  On 02/07/2018 patient is now agreeable to going to SNF for physical rehab.  02/07/2018: Patient seen and examined at his bedside.  No acute events overnight.  He has no new complaints.  Social worker is working on bed placement to SNF.  02/08/2018: Patient seen and examined at his bedside.  He has no new complaints.  Vital signs are stable.  Recommendations for Outpatient Follow-up:  1. Follow-up with your PCP 2. Continue wound care 3. Continue physical therapy 4. Fall precautions   WOUND CARE SPECIALIST RECOMMENDATIONS:  Reason for Consult: Consult requested for left leg wounds.  Pt states it became " bad all of a sudden. " He was reported to have maggots in the wounds upon admission, but none are noted upon assessment today Wound type: Left toes and anterior/posterior calf with patchy areas of dry scabs and eschar.  Most removes easily, when scrubbed with moist gauze, revealing red wound beds with small amt bloody drainage.  No fluctuance or odor.  Toes are pink and dry after scabs removed. Left outer leg with dark red dry partial thickness wound, 1.5X.3X.1cm Left posterior leg with full thickness wound; 2X2X.2cm, red and moist, small amt yellow drainage Left anterior wound affected area is approx 8X4X.2cm, with 20% dry scabbed areas remaining, 80% red and moist, small amt bloody drainage.  Pt medicated for pain prior to removal and cleansing but it was still very painful.  Do not feel that surgical consult is required for further debridement. Dressing procedure/placement/frequency: Aquacel to provide antimicrobial benefits and absorb drainage.   Topical treatment orders provided for bedside nurses to perform.  Pt could benefit from home health assistance for dressing changes after discharge. Please re-consult if further assistance is needed.  Mardee Postin MSN, RN, Rober Minion, [place], [phone]  Discharge Diagnoses:  Active Hospital Problems   Diagnosis Date Noted  . Lower extremity cellulitis 02/02/2018  . Infected superficial injury of toe of right foot 02/02/2018  . Cellulitis 02/02/2018    Resolved Hospital Problems  No resolved problems to display.    Discharge Condition: Stable  Diet recommendation: Resume previous diet  Vitals:   02/07/18 2054 02/08/18 0525  BP: 138/74 (!) 152/74  Pulse: (!) 59 (!) 55  Resp: 18 20  Temp: 98.5 F (36.9 C) 97.6 F (36.4 C)  SpO2: 98% 96%    History of present illness:   Joel Solis a 72 y.o.malewith past medical history of hypertension, status post VP shunt, hemorrhagic stroke, presenting to the ED with wound to left leg. He states his home health nurse has been caring for this wound to his left leg that he believes began about 1 month ago. He states leg is significantly painful worse with palpation. Has been taking ibuprofen for pain. Reports his nurse called him today and told him the wound culture came back positive so he needed to report to the ED for admission. He is unable to recall what the culture resulted as. He denies fever, chills, or systemic symptoms. Denies history of diabetes.     Patient was admitted on 02/02/2018 as inpatient status due to complex wounds requiring IV antibiotics, frequent wound dressing, and close monitoring in the  setting of 72 multiple comorbidities and advanced age.  Patient states wounds became bad all of a sudden and reported to have maggots in the wound upon admission.  02/05/2018: Moderate pain in his left lower extremity on exam.  Wound care nurse specialist consulted and following with recommendations.  02/06/2018:  Patient seen and examined at his bedside.  States left leg feels better.  Has no new complaints.  On the day of discharge, the patient was hemodynamically stable.  He will need to follow-up with his primary care provider and continue wound care posthospitalization.  Hospital Course:  Active Problems:   Lower extremity cellulitis   Infected superficial injury of toe of right foot   Cellulitis  Lower extremity cellulitis complicated by chronic left lower extremity ulcer Switch oral antibiotics Keflex 500 mg 3 times daily x5 days Wound care specialist consulted Continue wound dressing per wound care especially recommendations Continue wound care at home Follow-up with your PCP  Hypertension Blood pressure stable Continue lisinopril, Lopressor, amlodipine  Hypokalemia Potassium 3.0 Repleted with p.o. potassium 40 mEq once x2 Follow-up with your PCP  Code Status: Full code  Family Communication: None at bedside  Disposition Plan: Home in 1 to 2 days when clinically stable   Consultants:  Wound care specialist  General surgery   Antimicrobials:  Completed 5 days of IV cefepime   Discharge Exam: BP (!) 152/74 (BP Location: Right Arm)   Pulse (!) 55   Temp 97.6 F (36.4 C) (Oral)   Resp 20   Ht 5\' 8"  (1.727 m)   Wt 88.5 kg   SpO2 96%   BMI 29.65 kg/m  . General: 72 y.o. year-old male well-developed well-nourished in no acute distress.  Alert and oriented x3. . Cardiovascular: Regular rate and rhythm with no rubs or gallops.  No thyromegaly or JVD noted.   Marland Kitchen Respiratory: Clear to auscultation with no wheezes or rales. Good inspiratory effort. . Abdomen: Soft nontender nondistended with normal bowel sounds x4 quadrants. . Psychiatry: Mood is appropriate for condition and setting  Discharge Instructions You were cared for by a hospitalist during your hospital stay. If you have any questions about your discharge medications or the care you received while  you were in the hospital after you are discharged, you can call the unit and asked to speak with the hospitalist on call if the hospitalist that took care of you is not available. Once you are discharged, your primary care physician will handle any further medical issues. Please note that NO REFILLS for any discharge medications will be authorized once you are discharged, as it is imperative that you return to your primary care physician (or establish a relationship with a primary care physician if you do not have one) for your aftercare needs so that they can reassess your need for medications and monitor your lab values.   Allergies as of 02/08/2018      Reactions   Shellfish Allergy Nausea And Vomiting      Medication List    STOP taking these medications   ibuprofen 200 MG tablet Commonly known as:  ADVIL,MOTRIN   lactulose 10 GM/15ML solution Commonly known as:  CHRONULAC     TAKE these medications   amLODipine 10 MG tablet Commonly known as:  NORVASC Take 10 mg by mouth daily.   atorvastatin 40 MG tablet Commonly known as:  LIPITOR Take 40 mg by mouth daily.   baclofen 10 MG tablet Commonly known as:  LIORESAL Take 10 mg by  mouth 3 (three) times daily.   cephALEXin 500 MG capsule Commonly known as:  KEFLEX Take 1 capsule (500 mg total) by mouth every 8 (eight) hours.   furosemide 20 MG tablet Commonly known as:  LASIX Take 20 mg by mouth daily.   lisinopril 20 MG tablet Commonly known as:  PRINIVIL,ZESTRIL Take 20 mg by mouth daily.   metoprolol tartrate 100 MG tablet Commonly known as:  LOPRESSOR Take 100 mg by mouth 2 (two) times daily.   SALONPAS ARTHRITIS PAIN RELIEF EX Apply 1 patch topically daily as needed (leg pain).      Allergies  Allergen Reactions  . Shellfish Allergy Nausea And Vomiting   Follow-up Information    [facility]. Call in 1 day(s).   Specialty:  Family Medicine Why:  Please call for follow-up  appointment. Contact information: [address] [place] [address] [phone]        [facility] Follow up.   Specialty:  Home Health Services Why:  [facility]physical/occupational therapy/aide,social worker Contact information: [address] [place] [address] [phone]            The results of significant diagnostics from this hospitalization (including imaging, microbiology, ancillary and laboratory) are listed below for reference.    Significant Diagnostic Studies: Dg Tibia/fibula Left  Result Date: 02/02/2018 CLINICAL DATA:  Left leg cellulitis and pain. EXAM: LEFT TIBIA AND FIBULA - 2 VIEW COMPARISON:  None. FINDINGS: Diffuse soft tissue swelling. No bone destruction or periosteal reaction. No soft tissue gas. Diffuse osteopenia. Mild atheromatous arterial calcifications. IMPRESSION: Diffuse soft tissue swelling without radiographic evidence of underlying osteomyelitis. Electronically Signed   By: Beckie SaltsSteven  Reid M.D.   On: 02/02/2018 13:58   Dg Foot Complete Left  Result Date: 02/02/2018 CLINICAL DATA:  Left leg and foot cellulitis. EXAM: LEFT FOOT - COMPLETE 3+ VIEW COMPARISON:  None. FINDINGS: Diffuse soft tissue swelling and diffuse osteopenia. No soft tissue gas, bone destruction or periosteal reaction. IMPRESSION: Diffuse soft tissue swelling and diffuse osteopenia without radiographic evidence of underlying osteomyelitis. Electronically Signed   By: Beckie SaltsSteven  Reid M.D.   On: 02/02/2018 13:59    Microbiology: Recent Results (from the past 240 hour(s))  Culture, blood (routine x 2)     Status: None   Collection Time: 02/02/18  1:40 PM  Result Value Ref Range Status   Specimen Description   Final    BLOOD BLOOD RIGHT FOREARM Performed at [facility], [address] [place], [place] [address]    Special Requests   Final    BOTTLES DRAWN AEROBIC AND ANAEROBIC Blood Culture adequate volume Performed at [facility], [address] [place], [place] [address]    Culture   Final    NO GROWTH 5 DAYS Performed at [facility], [address] [place], [place] [address]    Report Status 02/07/2018 FINAL  Final  Culture, blood (routine x 2)     Status: None   Collection Time: 02/02/18  1:40 PM  Result Value Ref Range Status   Specimen Description   Final    BLOOD RIGHT ARM Performed at [facility], [address] [place], [place] [address]    Special Requests   Final    BOTTLES DRAWN AEROBIC AND ANAEROBIC Blood Culture adequate volume Performed at [facility], [address] [place], [place] [address]    Culture  Final    NO GROWTH 5 DAYS Performed at [facility], [address], [place], [place] [address]    Report Status 02/07/2018 FINAL  Final  MRSA PCR Screening     Status: None   Collection Time: 02/02/18  7:37 PM  Result Value Ref Range Status   MRSA by PCR NEGATIVE NEGATIVE Final    Comment:        The GeneXpert MRSA Assay (FDA approved for NASAL specimens only), is one component of a comprehensive MRSA colonization surveillance program. It is not intended to diagnose MRSA infection nor to guide or monitor treatment for MRSA infections. Performed at [facility], [address] [place], [place] [address]      Labs: Basic Metabolic Panel: Recent Labs  Lab 02/02/18 1136 02/03/18 0411 02/05/18 0357 02/06/18 0426 02/07/18 0644  NA 142 139 139 137  --   K 3.4* 2.9* 3.3* 3.0* 3.9  CL 105 106 106 104  --   CO2 28 26 25 23   --   GLUCOSE 89 92 90 95  --   BUN 31* 23 15 12   --   CREATININE 1.60* 1.35* 1.04 0.86  --   CALCIUM 8.7* 7.2* 8.4* 8.4*  --   MG  --   --  2.0  --   --    Liver Function Tests: Recent Labs  Lab 02/02/18 1136 02/03/18 0411  AST 15 12*  ALT 12 8  ALKPHOS 142* 113  BILITOT 0.9 0.5  PROT 7.9 6.2*  ALBUMIN 3.4* 2.6*   No results for input(s):  LIPASE, AMYLASE in the last 168 hours. Recent Labs  Lab 02/07/18 0644  AMMONIA 34   CBC: Recent Labs  Lab 02/02/18 1136 02/03/18 0411 02/05/18 0357 02/06/18 0426  WBC 8.4 7.9 7.4 7.2  NEUTROABS 4.8  --  4.3  --   HGB 15.7 13.5 14.5 15.0  HCT 48.4 43.5 45.6 46.8  MCV 86.1 87.0 85.7 85.1  PLT 340 307 326 342   Cardiac Enzymes: No results for input(s): CKTOTAL, CKMB, CKMBINDEX, TROPONINI in the last 168 hours. BNP: BNP (last 3 results) No results for input(s): BNP in the last 8760 hours.  ProBNP (last 3 results) No results for input(s): PROBNP in the last 8760 hours.  CBG: No results for input(s): GLUCAP in the last 168 hours.     Signed:  Darlin Drop, MD [facility] 02/08/2018, 12:31 PM

## 2018-02-08 NOTE — Clinical Social Work Placement (Signed)
  CLINICAL SOCIAL WORK PLACEMENT  NOTE  Date:  02/08/2018  Patient Details  Name: Joel Solis MRN: [id number] Date of Birth: 04-12-1946  Clinical Social Work is seeking post-discharge placement for this patient at the Skilled  Nursing Facility level of care (*CSW will initial, date and re-position this form in  chart as items are completed):  Yes   Patient/family provided with [place] Clinical Social Work Department's list of facilities offering this level of care within the geographic area requested by the patient (or if unable, by the patient's family).  Yes   Patient/family informed of their freedom to choose among providers that offer the needed level of care, that participate in Medicare, Medicaid or managed care program needed by the patient, have an available bed and are willing to accept the patient.  Yes   Patient/family informed of [facility]'s ownership interest in [facility] and [facility], as well as of the fact that they are under no obligation to receive care at these facilities.  PASRR submitted to EDS on 02/08/18     PASRR number received on 02/08/18     Existing PASRR number confirmed on       FL2 transmitted to all facilities in geographic area requested by pt/family on       FL2 transmitted to all facilities within larger geographic area on 02/08/18     Patient informed that his/her managed care company has contracts with or will negotiate with certain facilities, including the following:  [facility]     Yes   Patient/family informed of bed offers received.  [facility]     Physician recommends and patient chooses bed at      Patient to be [facility] on 02/08/18.  Patient to be transferred to facility by [facility]     Patient family notified on 02/08/18 of transfer.  Name of family member notified:  Daughter-Emily.      PHYSICIAN       Additional Comment:     _______________________________________________ Clearance CootsNicole A Larra Crunkleton, LCSW 02/08/2018, 12:37 PM

## 2018-02-08 NOTE — Discharge Summary (Signed)
Discharge Summary  Joel PearsonDaniel Solis ZOX:[id number] DOB: [birth year]  PCP: Abelardo [facility]elPremier, [facility]  Admit date: 02/02/2018 Discharge date: 02/08/2018  Time spent: 25 minutes  UPDATE: Discharge delayed on 02/06/2018 due to patient unable to care for himself at home.  SNF was recommended prior to discharge however patient declined.  On 02/07/2018 patient is now agreeable to going to SNF for physical rehab.  02/07/2018: Patient seen and examined at his bedside.  No acute events overnight.  He has no new complaints.  Social worker is working on bed placement to SNF.  02/08/2018: Patient seen and examined at his bedside.  He has no new complaints.  Vital signs are stable.  On the day of discharge, the patient was hemodynamically stable.  He will need to follow-up with his primary care provider and continue wound care.    Recommendations for Outpatient Follow-up:  1. Follow-up with your PCP 2. Continue wound care 3. Continue physical therapy 4. Fall precautions   WOUND CARE SPECIALIST RECOMMENDATIONS:  Reason for Consult: Consult requested for left leg wounds.  Pt states it became " bad all of a sudden. " He was reported to have maggots in the wounds upon admission, but none are noted upon assessment today Wound type: Left toes and anterior/posterior calf with patchy areas of dry scabs and eschar.  Most removes easily, when scrubbed with moist gauze, revealing red wound beds with small amt bloody drainage.  No fluctuance or odor.  Toes are pink and dry after scabs removed. Left outer leg with dark red dry partial thickness wound, 1.5X.3X.1cm Left posterior leg with full thickness wound; 2X2X.2cm, red and moist, small amt yellow drainage Left anterior wound affected area is approx 8X4X.2cm, with 20% dry scabbed areas remaining, 80% red and moist, small amt bloody drainage.  Pt medicated for pain prior to removal and cleansing but it was still very painful.  Do not feel that surgical  consult is required for further debridement. Dressing procedure/placement/frequency: Aquacel to provide antimicrobial benefits and absorb drainage.  Topical treatment orders provided for bedside nurses to perform.  Pt could benefit from home health assistance for dressing changes after discharge. Please re-consult if further assistance is needed.  Mardee Postinhank-you,  Dawn Engels MSN, RN, Rober MinionCWOCN, AdvanceWCN-AP, [place] [phone]  Discharge Diagnoses:  Active Hospital Problems   Diagnosis Date Noted  . Lower extremity cellulitis 02/02/2018  . Infected superficial injury of toe of right foot 02/02/2018  . Cellulitis 02/02/2018    Resolved Hospital Problems  No resolved problems to display.    Discharge Condition: Stable  Diet recommendation: Resume previous diet  Vitals:   02/07/18 2054 02/08/18 0525  BP: 138/74 (!) 152/74  Pulse: (!) 59 (!) 55  Resp: 18 20  Temp: 98.5 F (36.9 C) 97.6 F (36.4 C)  SpO2: 98% 96%    History of present illness:   Joel HoesDaniel Powersis a 71 y.o.malewith past medical history of hypertension, status post VP shunt, hemorrhagic stroke, presenting to the ED with wound to left leg. He states his home health nurse has been caring for this wound to his left leg that he believes began about 1 month ago. He states leg is significantly painful worse with palpation. Has been taking ibuprofen for pain. Reports his nurse called him today and told him the wound culture came back positive so he needed to report to the ED for admission. He is unable to recall what the culture resulted as. He denies fever, chills, or systemic symptoms. Denies history of  diabetes.     Patient was admitted on 02/02/2018 as inpatient status due to complex wounds requiring IV antibiotics, frequent wound dressing, and close monitoring in the setting of multiple comorbidities and advanced age.  Patient states wounds became bad all of a sudden and reported to have maggots in the wound upon  admission.  02/05/2018: Moderate pain in his left lower extremity on exam.  Wound care nurse specialist consulted and following with recommendations.  02/06/2018: Patient seen and examined at his bedside.  States left leg feels better.  Has no new complaints.   Hospital Course:  Active Problems:   Lower extremity cellulitis   Infected superficial injury of toe of right foot   Cellulitis  Lower extremity cellulitis complicated by chronic left lower extremity ulcer Continue Keflex 500 mg 3 times daily x3 days Wound care specialist consulted Continue wound dressing per wound care especially recommendations Continue wound care at home Follow-up with your PCP  Hyperammonemia Last ammonia level 34 Continue lactulose 20 g daily  Hypertension Blood pressure stable Continue lisinopril, Lopressor, amlodipine  Resolved hypokalemia post repletion  Ambulatory dysfunction secondary to 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 General: 72 y.o. year-old male well-developed well-nourished in no acute distress.  Alert and oriented x3. . Cardiovascular: Regular rate and rhythm with no rubs or gallops.  No thyromegaly or JVD noted.   Marland Kitchen Respiratory: Clear to auscultation with no wheezes or rales. Good inspiratory effort. . Abdomen: Soft nontender nondistended with normal bowel sounds x4 quadrants. . Psychiatry: Mood is appropriate for condition and setting  Discharge Instructions You were cared for by a  hospitalist during your hospital stay. If you have any questions about your discharge medications or the care you received while you were in the hospital after you are discharged, you can call the unit and asked to speak with the hospitalist on call if the hospitalist that took care of you is not available. Once you are discharged, your primary care physician will handle any further medical issues. Please note that NO REFILLS for any discharge medications will be authorized once you are discharged, as it is imperative that you return to your primary care physician (or establish a relationship with a primary care physician if you do not have one) for your aftercare needs so that they can reassess your need for medications and monitor your lab values.   Allergies as of 02/08/2018      Reactions   Shellfish Allergy Nausea And Vomiting      Medication List    STOP taking these medications   ibuprofen 200 MG tablet Commonly known as:  ADVIL,MOTRIN     TAKE these medications   amLODipine 10 MG tablet Commonly known as:  NORVASC Take 10 mg by mouth daily.   atorvastatin 40 MG tablet Commonly known as:  LIPITOR Take 40 mg by mouth daily.   baclofen 10 MG tablet  Commonly known as:  LIORESAL Take 10 mg by mouth 3 (three) times daily.   cephALEXin 500 MG capsule Commonly known as:  KEFLEX Take 1 capsule (500 mg total) by mouth every 8 (eight) hours for 3 days.   furosemide 20 MG tablet Commonly known as:  LASIX Take 20 mg by mouth daily.   lactulose 10 GM/15ML solution Commonly known as:  CHRONULAC Take 30 mLs (20 g total) by mouth daily.   lisinopril 20 MG tablet Commonly known as:  PRINIVIL,ZESTRIL Take 20 mg by mouth daily.   metoprolol tartrate 100 MG tablet Commonly known as:  LOPRESSOR Take 100 mg by mouth 2 (two) times daily.   SALONPAS ARTHRITIS PAIN RELIEF EX Apply 1 patch topically daily as needed (leg pain).      Allergies  Allergen Reactions  . Shellfish Allergy  Nausea And Vomiting    Contact information for follow-up providers    [facility], [facility]. Call in 1 day(s).   Specialty:  Family Medicine Why:  Please call for follow-up appointment. Contact information: [address] [place] [address] [phone]        [facility] Follow up.   Specialty:  Home Health Services Why:  [facility] nursing/physical/occupational therapy/aide,social worker Contact information: [address] [place] [address] [place] [address] [phone]            Contact information for after-discharge care    Destination    [facility] CARE Preferred SNF .   Service:  Skilled Nursing Contact information: [address] [place] [address] [phone]                   The results of significant diagnostics from this hospitalization (including imaging, microbiology, ancillary and laboratory) are listed below for reference.    Significant Diagnostic Studies: Dg Tibia/fibula Left  Result Date: 02/02/2018 CLINICAL DATA:  Left leg cellulitis and pain. EXAM: LEFT TIBIA AND FIBULA - 2 VIEW COMPARISON:  None. FINDINGS: Diffuse soft tissue swelling. No bone destruction or periosteal reaction. No soft tissue gas. Diffuse osteopenia. Mild atheromatous arterial calcifications. IMPRESSION: Diffuse soft tissue swelling without radiographic evidence of underlying osteomyelitis. Electronically Signed   By: Beckie Salts M.D.   On: 02/02/2018 13:58   Dg Foot Complete Left  Result Date: 02/02/2018 CLINICAL DATA:  Left leg and foot cellulitis. EXAM: LEFT FOOT - COMPLETE 3+ VIEW COMPARISON:  None. FINDINGS: Diffuse soft tissue swelling and diffuse osteopenia. No soft tissue gas, bone destruction or periosteal reaction. IMPRESSION: Diffuse soft tissue swelling and diffuse osteopenia without radiographic evidence of underlying osteomyelitis. Electronically Signed   By: Beckie Salts M.D.   On: 02/02/2018 13:59     Microbiology: Recent Results (from the past 240 hour(s))  Culture, blood (routine x 2)     Status: None   Collection Time: 02/02/18  1:40 PM  Result Value Ref Range Status   Specimen Description   Final    BLOOD BLOOD RIGHT FOREARM Performed at [facility], [address] [place], [place] [address]    Special Requests   Final    BOTTLES DRAWN AEROBIC AND ANAEROBIC Blood Culture adequate volume Performed at [facility], [address], [place], [place] [address]    Culture   Final    NO GROWTH 5 DAYS Performed at [facility], [address] [place], [place] [address]    Report Status 02/07/2018 FINAL  Final  Culture, blood (  routine x 2)     Status: None   Collection Time: 02/02/18  1:40 PM  Result Value Ref Range Status   Specimen Description   Final    BLOOD RIGHT ARM Performed at [facility], [address], [place], [place] [address]    Special Requests   Final    BOTTLES DRAWN AEROBIC AND ANAEROBIC Blood Culture adequate volume Performed at [facility], [address], [place], [place] [address]    Culture   Final    NO GROWTH 5 DAYS Performed at [facility], [address], [place], [place] [address]    Report Status 02/07/2018 FINAL  Final  MRSA PCR Screening     Status: None   Collection Time: 02/02/18  7:37 PM  Result Value Ref Range Status   MRSA by PCR NEGATIVE NEGATIVE Final    Comment:        The GeneXpert MRSA Assay (FDA approved for NASAL specimens only), is one component of a comprehensive MRSA colonization surveillance program. It is not intended to diagnose MRSA infection nor to guide or monitor treatment for MRSA infections. Performed at [facility], [address] [place], [place] [address]      Labs: Basic Metabolic Panel: Recent Labs  Lab 02/02/18 1136 02/03/18 0411 02/05/18 0357 02/06/18 0426 02/07/18 0644  NA 142  139 139 137  --   K 3.4* 2.9* 3.3* 3.0* 3.9  CL 105 106 106 104  --   CO2 28 26 25 23   --   GLUCOSE 89 92 90 95  --   BUN 31* 23 15 12   --   CREATININE 1.60* 1.35* 1.04 0.86  --   CALCIUM 8.7* 7.2* 8.4* 8.4*  --   MG  --   --  2.0  --   --    Liver Function Tests: Recent Labs  Lab 02/02/18 1136 02/03/18 0411  AST 15 12*  ALT 12 8  ALKPHOS 142* 113  BILITOT 0.9 0.5  PROT 7.9 6.2*  ALBUMIN 3.4* 2.6*   No results for input(s): LIPASE, AMYLASE in the last 168 hours. Recent Labs  Lab 02/07/18 0644  AMMONIA 34   CBC: Recent Labs  Lab 02/02/18 1136 02/03/18 0411 02/05/18 0357 02/06/18 0426  WBC 8.4 7.9 7.4 7.2  NEUTROABS 4.8  --  4.3  --   HGB 15.7 13.5 14.5 15.0  HCT 48.4 43.5 45.6 46.8  MCV 86.1 87.0 85.7 85.1  PLT 340 307 326 342   Cardiac Enzymes: No results for input(s): CKTOTAL, CKMB, CKMBINDEX, TROPONINI in the last 168 hours. BNP: BNP (last 3 results) No results for input(s): BNP in the last 8760 hours.  ProBNP (last 3 results) No results for input(s): PROBNP in the last 8760 hours.  CBG: No results for input(s): GLUCAP in the last 168 hours.     Signed:  Darlin Drop, MD [facility] 02/08/2018, 12:46 PM

## 2018-03-15 ENCOUNTER — Emergency Department (HOSPITAL_COMMUNITY): Payer: Medicare Other

## 2018-03-15 ENCOUNTER — Encounter (HOSPITAL_COMMUNITY): Payer: Self-pay

## 2018-03-15 ENCOUNTER — Inpatient Hospital Stay (HOSPITAL_COMMUNITY)
Admission: EM | Admit: 2018-03-15 | Discharge: 2018-03-17 | DRG: 603 | Disposition: A | Discharge status: Home / Self Care | Payer: Medicare Other | Attending: Internal Medicine | Admitting: Internal Medicine

## 2018-03-15 ENCOUNTER — Other Ambulatory Visit: Payer: Self-pay

## 2018-03-15 DIAGNOSIS — F17200 Nicotine dependence, unspecified, uncomplicated: Secondary | ICD-10-CM | POA: Diagnosis not present

## 2018-03-15 DIAGNOSIS — Z791 Long term (current) use of non-steroidal anti-inflammatories (NSAID): Secondary | ICD-10-CM

## 2018-03-15 DIAGNOSIS — L03116 Cellulitis of left lower limb: Principal | ICD-10-CM | POA: Diagnosis present

## 2018-03-15 DIAGNOSIS — Z982 Presence of cerebrospinal fluid drainage device: Secondary | ICD-10-CM

## 2018-03-15 DIAGNOSIS — I739 Peripheral vascular disease, unspecified: Secondary | ICD-10-CM | POA: Diagnosis present

## 2018-03-15 DIAGNOSIS — Z800 Family history of malignant neoplasm of digestive organs: Secondary | ICD-10-CM

## 2018-03-15 DIAGNOSIS — E876 Hypokalemia: Secondary | ICD-10-CM | POA: Diagnosis present

## 2018-03-15 DIAGNOSIS — I890 Lymphedema, not elsewhere classified: Secondary | ICD-10-CM | POA: Diagnosis present

## 2018-03-15 DIAGNOSIS — Z801 Family history of malignant neoplasm of trachea, bronchus and lung: Secondary | ICD-10-CM

## 2018-03-15 DIAGNOSIS — I69354 Hemiplegia and hemiparesis following cerebral infarction affecting left non-dominant side: Secondary | ICD-10-CM | POA: Diagnosis not present

## 2018-03-15 DIAGNOSIS — E785 Hyperlipidemia, unspecified: Secondary | ICD-10-CM | POA: Diagnosis present

## 2018-03-15 DIAGNOSIS — Z79899 Other long term (current) drug therapy: Secondary | ICD-10-CM | POA: Diagnosis not present

## 2018-03-15 DIAGNOSIS — L0390 Cellulitis, unspecified: Secondary | ICD-10-CM | POA: Diagnosis present

## 2018-03-15 DIAGNOSIS — I10 Essential (primary) hypertension: Secondary | ICD-10-CM | POA: Diagnosis not present

## 2018-03-15 DIAGNOSIS — Z91013 Allergy to seafood: Secondary | ICD-10-CM

## 2018-03-15 LAB — URINALYSIS, ROUTINE W REFLEX MICROSCOPIC
Bilirubin Urine: NEGATIVE
GLUCOSE, UA: NEGATIVE mg/dL
HGB URINE DIPSTICK: NEGATIVE
Ketones, ur: NEGATIVE mg/dL
Leukocytes, UA: NEGATIVE
Nitrite: NEGATIVE
PH: 5 (ref 5.0–8.0)
Protein, ur: NEGATIVE mg/dL
SPECIFIC GRAVITY, URINE: 1.02 (ref 1.005–1.030)

## 2018-03-15 LAB — CBC WITH DIFFERENTIAL/PLATELET
Abs Immature Granulocytes: 0.04 10*3/uL (ref 0.00–0.07)
BASOS ABS: 0.1 10*3/uL (ref 0.0–0.1)
Basophils Relative: 1 %
EOS PCT: 2 %
Eosinophils Absolute: 0.2 10*3/uL (ref 0.0–0.5)
HCT: 50.3 % (ref 39.0–52.0)
HEMOGLOBIN: 15.1 g/dL (ref 13.0–17.0)
IMMATURE GRANULOCYTES: 0 %
LYMPHS PCT: 27 %
Lymphs Abs: 2.8 10*3/uL (ref 0.7–4.0)
MCH: 26.4 pg (ref 26.0–34.0)
MCHC: 30 g/dL (ref 30.0–36.0)
MCV: 88.1 fL (ref 80.0–100.0)
MONOS PCT: 7 %
Monocytes Absolute: 0.7 10*3/uL (ref 0.1–1.0)
NEUTROS PCT: 63 %
NRBC: 0 % (ref 0.0–0.2)
Neutro Abs: 6.6 10*3/uL (ref 1.7–7.7)
PLATELETS: 300 10*3/uL (ref 150–400)
RBC: 5.71 MIL/uL (ref 4.22–5.81)
RDW: 13.5 % (ref 11.5–15.5)
WBC: 10.5 10*3/uL (ref 4.0–10.5)

## 2018-03-15 LAB — COMPREHENSIVE METABOLIC PANEL
ALT: 19 U/L (ref 0–44)
AST: 21 U/L (ref 15–41)
Albumin: 3.6 g/dL (ref 3.5–5.0)
Alkaline Phosphatase: 165 U/L — ABNORMAL HIGH (ref 38–126)
Anion gap: 9 (ref 5–15)
BUN: 16 mg/dL (ref 8–23)
CO2: 26 mmol/L (ref 22–32)
Calcium: 8.5 mg/dL — ABNORMAL LOW (ref 8.9–10.3)
Chloride: 108 mmol/L (ref 98–111)
Creatinine, Ser: 1.05 mg/dL (ref 0.61–1.24)
GFR calc Af Amer: >60 mL/min (ref >60)
GFR calc non Af Amer: >60 mL/min (ref >60)
Glucose, Bld: 89 mg/dL (ref 70–99)
Potassium: 3.2 mmol/L — ABNORMAL LOW (ref 3.5–5.1)
Sodium: 143 mmol/L (ref 135–145)
Total Bilirubin: 0.5 mg/dL (ref 0.3–1.2)
Total Protein: 7.9 g/dL (ref 6.5–8.1)

## 2018-03-15 LAB — I-STAT CG4 LACTIC ACID, ED: Lactic Acid, Venous: 1.39 mmol/L (ref 0.5–1.9)

## 2018-03-15 MED ORDER — IBUPROFEN 200 MG PO TABS
400.0000 mg | ORAL_TABLET | Freq: Once | ORAL | Status: AC
  Administered 2018-03-15: 400 mg via ORAL
  Filled 2018-03-15: qty 2

## 2018-03-15 MED ORDER — SODIUM CHLORIDE 0.9 % IV SOLN
2.0000 g | INTRAVENOUS | Status: AC
  Administered 2018-03-15: 2 g via INTRAVENOUS
  Filled 2018-03-15: qty 2

## 2018-03-15 MED ORDER — VANCOMYCIN HCL 10 G IV SOLR
1500.0000 mg | INTRAVENOUS | Status: DC
  Administered 2018-03-16 – 2018-03-17 (×2): 1500 mg via INTRAVENOUS
  Filled 2018-03-15 (×2): qty 1500

## 2018-03-15 MED ORDER — SODIUM CHLORIDE 0.9 % IV SOLN
2.0000 g | Freq: Two times a day (BID) | INTRAVENOUS | Status: DC
  Administered 2018-03-16 – 2018-03-17 (×3): 2 g via INTRAVENOUS
  Filled 2018-03-15 (×4): qty 2

## 2018-03-15 MED ORDER — ATORVASTATIN CALCIUM 40 MG PO TABS
40.0000 mg | ORAL_TABLET | Freq: Every day | ORAL | Status: DC
  Administered 2018-03-15 – 2018-03-17 (×3): 40 mg via ORAL
  Filled 2018-03-15 (×3): qty 1

## 2018-03-15 MED ORDER — SODIUM CHLORIDE 0.9 % IV SOLN
INTRAVENOUS | Status: DC | PRN
  Administered 2018-03-15: 500 mL via INTRAVENOUS

## 2018-03-15 MED ORDER — LISINOPRIL 20 MG PO TABS
20.0000 mg | ORAL_TABLET | Freq: Every day | ORAL | Status: DC
  Administered 2018-03-15 – 2018-03-17 (×3): 20 mg via ORAL
  Filled 2018-03-15 (×3): qty 1

## 2018-03-15 MED ORDER — SODIUM CHLORIDE 0.9 % IV BOLUS (SEPSIS)
500.0000 mL | Freq: Once | INTRAVENOUS | Status: AC
  Administered 2018-03-15: 500 mL via INTRAVENOUS

## 2018-03-15 MED ORDER — ENOXAPARIN SODIUM 40 MG/0.4ML ~~LOC~~ SOLN
40.0000 mg | SUBCUTANEOUS | Status: DC
  Administered 2018-03-15: 40 mg via SUBCUTANEOUS
  Filled 2018-03-15 (×2): qty 0.4

## 2018-03-15 MED ORDER — SODIUM CHLORIDE 0.9 % IV BOLUS (SEPSIS)
1000.0000 mL | Freq: Once | INTRAVENOUS | Status: AC
  Administered 2018-03-15: 1000 mL via INTRAVENOUS

## 2018-03-15 MED ORDER — VANCOMYCIN HCL 10 G IV SOLR
2500.0000 mg | Freq: Once | INTRAVENOUS | Status: AC
  Administered 2018-03-15: 2500 mg via INTRAVENOUS
  Filled 2018-03-15: qty 2000

## 2018-03-15 MED ORDER — SODIUM CHLORIDE 0.9 % IV SOLN
2.0000 g | INTRAVENOUS | Status: DC
  Administered 2018-03-15: 2 g via INTRAVENOUS
  Filled 2018-03-15: qty 20

## 2018-03-15 MED ORDER — FUROSEMIDE 40 MG PO TABS
20.0000 mg | ORAL_TABLET | Freq: Every day | ORAL | Status: DC
  Administered 2018-03-15 – 2018-03-17 (×3): 20 mg via ORAL
  Filled 2018-03-15 (×3): qty 1

## 2018-03-15 NOTE — Care Management Note (Signed)
Case Management Note  CM advised by Kathlene November with [facility] that pt is active with their [facility] RN PT OT NA.  He will follow for needs.  Valerie Fredin, Lynnae Sandhoff, RN 03/15/2018, 11:42 AM

## 2018-03-15 NOTE — ED Triage Notes (Signed)
Per EMS: Pt from PCP.  Pt has cellulitis R lower leg, PCP sent him to ED.  Pt hx of cellulitis with admission for tx.

## 2018-03-15 NOTE — Progress Notes (Signed)
A consult was received from an ED physician for vancomycin per pharmacy dosing.  The patient's profile has been reviewed for ht/wt/allergies/indication/available labs.    A one time order has been placed for vancomycin 2500 mg.    Further antibiotics/pharmacy consults should be ordered by admitting physician if indicated.                       Thank you, Herby Abraham, Pharm.D [phone] 03/15/2018 11:00 AM

## 2018-03-15 NOTE — Progress Notes (Signed)
Pharmacy Antibiotic Note  Joel Solis is a 72 y.o. male admitted on 03/15/2018 with recurrent LLE cellulitis.  Pharmacy has been consulted for Vancomycin and Cefepime dosing.  Plan:  Vancomycin 2500mg  IV x 1 given in the ED. Continue with Vancomycin 1500mg  IV q24h.  Plan for Vancomycin peak/trough levels at steady state, as indicated.   Cefepime 2g IV q12h.   Monitor renal function, cultures, clinical course.   Height: 5\' 9"  (175.3 cm) Weight: 245 lb (111.1 kg) IBW/kg (Calculated) : 70.7  Temp (24hrs), Avg:98 F (36.7 C), Min:98 F (36.7 C), Max:98 F (36.7 C)  Recent Labs  Lab 03/15/18 1056 03/15/18 1155  WBC 10.5  --   CREATININE 1.05  --   LATICACIDVEN  --  1.39    Estimated Creatinine Clearance: 78.2 mL/min (by C-G formula based on SCr of 1.05 mg/dL).    Allergies  Allergen Reactions  . Shellfish Allergy Nausea And Vomiting    Antimicrobials this admission: 10/24 Ceftriaxone x 1 10/24 Vancomycin >> 10/24 Cefepime >>  Dose adjustments this admission: --  Microbiology results: 10/24 BCx: sent 10/24 UCx: sent    Thank you for allowing pharmacy to be a part of this patient's care.   Greer Pickerel, PharmD, BCPS Pager: [phone] 03/15/2018 4:26 PM

## 2018-03-15 NOTE — ED Notes (Signed)
ED TO INPATIENT HANDOFF REPORT  Name/Age/Gender Joel Solis 72 y.o. male  Code Status    Code Status Orders  (From admission, onward)         Start     Ordered   03/15/18 1623  Full code  Continuous     03/15/18 1622        Code Status History    Date Active Date Inactive Code Status Order ID Comments User Context   02/02/2018 1512 02/08/2018 1813 Full Code [id number]  Nita Sells, MD ED   11/22/2016 1918 11/24/2016 2247 Full Code [id number]  Vianne Bulls, MD ED   11/15/2016 2029 11/17/2016 1700 Full Code [id number]  Ivor Costa, MD ED    Advance Directive Documentation     Most Recent Value  Type of Advance Directive  Healthcare Power of Attorney  Pre-existing out of facility DNR order (yellow form or pink MOST form)  -  "MOST" Form in Place?  -      Home/SNF/Other Home  Chief Complaint possible infection   Level of Care/Admitting Diagnosis ED Disposition    ED Disposition Condition [place]: [facility] [[id number]]  Level of Care: Med-Surg [16]  Diagnosis: Cellulitis [086578]  Admitting Physician: Georgette Shell [[id number]]  Attending Physician: Georgette Shell [[id number]]  PT Class (Do Not Modify): Observation [104]  PT Acc Code (Do Not Modify): Observation [10022]       Medical History Past Medical History:  Diagnosis Date  . Essential hypertension   . Hemorrhagic stroke ([place])   . HLD (hyperlipidemia)   . S/P VP shunt   . Tobacco abuse     Allergies Allergies  Allergen Reactions  . Shellfish Allergy Nausea And Vomiting    IV Location/Drains/Wounds Patient Lines/Drains/Airways Status   Active Line/Drains/Airways    Name:   Placement date:   Placement time:   Site:   Days:   Peripheral IV 11/22/16 Right Hand   11/22/16    1247    Hand   478   Peripheral IV 03/15/18 Right Forearm   03/15/18    1152    Forearm   less than 1   Peripheral IV 03/15/18 Right;Upper Arm   03/15/18    1207    Arm    less than 1   External Urinary Catheter   02/08/18    0500    -   35   Wound / Incision (Open or Dehisced) 02/02/18 Leg Left;Lower large wound, inner left leg   02/02/18    1800    Leg   41   Wound / Incision (Open or Dehisced) 02/02/18 Non-pressure wound;Other (Comment) Leg Left;Lower;Other (Comment) small, circular, scabbed wound   02/02/18    1930    Leg   41   Wound / Incision (Open or Dehisced) 02/02/18 Non-pressure wound;Other (Comment) Leg Left;Lower small, circular, scabbed wound, ( slightly above other wound on outer left leg)   02/02/18    1930    Leg   41          Labs/Imaging Results for orders placed or performed during the hospital encounter of 03/15/18 (from the past 48 hour(s))  Comprehensive metabolic panel     Status: Abnormal   Collection Time: 03/15/18 10:56 AM  Result Value Ref Range   Sodium 143 135 - 145 mmol/L   Potassium 3.2 (L) 3.5 - 5.1 mmol/L   Chloride 108 98 - 111 mmol/L   CO2 26  22 - 32 mmol/L   Glucose, Bld 89 70 - 99 mg/dL   BUN 16 8 - 23 mg/dL   Creatinine, Ser 1.05 0.61 - 1.24 mg/dL   Calcium 8.5 (L) 8.9 - 10.3 mg/dL   Total Protein 7.9 6.5 - 8.1 g/dL   Albumin 3.6 3.5 - 5.0 g/dL   AST 21 15 - 41 U/L   ALT 19 0 - 44 U/L   Alkaline Phosphatase 165 (H) 38 - 126 U/L   Total Bilirubin 0.5 0.3 - 1.2 mg/dL   GFR calc non Af Amer >60 >60 mL/min   GFR calc Af Amer >60 >60 mL/min    Comment: (NOTE) The eGFR has been calculated using the CKD EPI equation. This calculation has not been validated in all clinical situations. eGFR's persistently <60 mL/min signify possible Chronic Kidney Disease.    Anion gap 9 5 - 15    Comment: Performed at [facility], [place] [address], [place], [place] [address]  CBC WITH DIFFERENTIAL     Status: None   Collection Time: 03/15/18 10:56 AM  Result Value Ref Range   WBC 10.5 4.0 - 10.5 K/uL   RBC 5.71 4.22 - 5.81 MIL/uL   Hemoglobin 15.1 13.0 - 17.0 g/dL   HCT 50.3 39.0 - 52.0 %   MCV 88.1 80.0 -  100.0 fL   MCH 26.4 26.0 - 34.0 pg   MCHC 30.0 30.0 - 36.0 g/dL   RDW 13.5 11.5 - 15.5 %   Platelets 300 150 - 400 K/uL   nRBC 0.0 0.0 - 0.2 %   Neutrophils Relative % 63 %   Neutro Abs 6.6 1.7 - 7.7 K/uL   Lymphocytes Relative 27 %   Lymphs Abs 2.8 0.7 - 4.0 K/uL   Monocytes Relative 7 %   Monocytes Absolute 0.7 0.1 - 1.0 K/uL   Eosinophils Relative 2 %   Eosinophils Absolute 0.2 0.0 - 0.5 K/uL   Basophils Relative 1 %   Basophils Absolute 0.1 0.0 - 0.1 K/uL   Immature Granulocytes 0 %   Abs Immature Granulocytes 0.04 0.00 - 0.07 K/uL    Comment: Performed at [facility], [place] [address] [place], [place] [address]  Urinalysis, Routine w reflex microscopic (not at [facility])     Status: None   Collection Time: 03/15/18 11:25 AM  Result Value Ref Range   Color, Urine YELLOW YELLOW   APPearance CLEAR CLEAR   Specific Gravity, Urine 1.020 1.005 - 1.030   pH 5.0 5.0 - 8.0   Glucose, UA NEGATIVE NEGATIVE mg/dL   Hgb urine dipstick NEGATIVE NEGATIVE   Bilirubin Urine NEGATIVE NEGATIVE   Ketones, ur NEGATIVE NEGATIVE mg/dL   Protein, ur NEGATIVE NEGATIVE mg/dL   Nitrite NEGATIVE NEGATIVE   Leukocytes, UA NEGATIVE NEGATIVE    Comment: Performed at [facility] [address], [place], [place] [address]  Blood Culture (routine x 2)     Status: None (Preliminary result)   Collection Time: 03/15/18 11:26 AM  Result Value Ref Range   Specimen Description      BLOOD RIGHT ARM Performed at [facility] [address] [place], [place] [address]    Special Requests      BOTTLES DRAWN AEROBIC AND ANAEROBIC Blood Culture results may not be optimal due to an excessive volume of blood received in culture bottles   Culture PENDING    Report Status PENDING   I-Stat CG4 Lactic Acid, ED  (  not at  [facility])     Status: None   Collection Time: 03/15/18 11:55 AM  Result Value Ref Range   Lactic Acid, Venous 1.39 0.5 - 1.9 mmol/L   Dg Chest  Port 1 View  Result Date: 03/15/2018 CLINICAL DATA:  72 year old male with sepsis EXAM: PORTABLE CHEST 1 VIEW COMPARISON:  11/15/2016 radiograph FINDINGS: The cardiomediastinal silhouette is unremarkable. Mild LEFT basilar opacity likely represents atelectasis. A VP shunt is again noted overlying the RIGHT chest and abdomen. There is no evidence of focal airspace disease, pulmonary edema, suspicious pulmonary nodule/mass, pleural effusion, or pneumothorax. No acute bony abnormalities are identified. IMPRESSION: Mild LEFT basilar opacity-likely atelectasis. No other significant findings. Electronically Signed   By: Margarette Canada M.D.   On: 03/15/2018 13:48   Dg Foot Complete Left  Result Date: 03/15/2018 CLINICAL DATA:  Cellulitis of the left lower extremity, not responding to antibiotics EXAM: LEFT FOOT - COMPLETE 3+ VIEW COMPARISON:  Left foot films of 02/02/2017 FINDINGS: The bones of the left foot are diffusely osteopenic. No acute fracture is seen. There is degenerative change at the left first MTP joint and there is soft tissue swelling over the dorsum. IMPRESSION: Osteopenia.  No acute fracture. Electronically Signed   By: Ivar Drape M.D.   On: 03/15/2018 13:46    Pending Labs Unresulted Labs (From admission, onward)    Start     Ordered   03/22/18 0500  Creatinine, serum  (enoxaparin (LOVENOX)    CrCl >/= 30 ml/min)  Weekly,   R    Comments:  while on enoxaparin therapy    03/15/18 1622   03/16/18 0500  Creatinine, serum  Tomorrow morning,   R     03/15/18 1639   03/15/18 1622  CBC  (enoxaparin (LOVENOX)    CrCl >/= 30 ml/min)  Once,   R    Comments:  Baseline for enoxaparin therapy IF NOT ALREADY DRAWN.  Notify MD if PLT < 100 K.    03/15/18 1622   03/15/18 1622  Creatinine, serum  (enoxaparin (LOVENOX)    CrCl >/= 30 ml/min)  Once,   R    Comments:  Baseline for enoxaparin therapy IF NOT ALREADY DRAWN.    03/15/18 1622   03/15/18 1056  Blood Culture (routine x 2)  BLOOD CULTURE X  2,   STAT     03/15/18 1056   03/15/18 1056  Urine culture  STAT,   STAT     03/15/18 1056          Vitals/Pain Today's Vitals   03/15/18 1600 03/15/18 1615 03/15/18 1630 03/15/18 1645  BP: (!) 144/75 133/78 (!) 149/87 (!) 143/84  Pulse: 94 (!) 101 100 97  Resp: _0 (!) 21  Temp:      TempSrc:      SpO2: 98% 100% 95% 99%  Weight:      Height:      PainSc:        Isolation Precautions No active isolations  Medications Medications  ceFEPIme (MAXIPIME) 2 g in sodium chloride 0.9 % 100 mL IVPB (has no administration in time range)  ceFEPIme (MAXIPIME) 2 g in sodium chloride 0.9 % 100 mL IVPB (has no administration in time range)  vancomycin (VANCOCIN) 1,500 mg in sodium chloride 0.9 % 500 mL IVPB (has no administration in time range)  atorvastatin (LIPITOR) tablet 40 mg (has no administration in time range)  furosemide (LASIX) tablet 20 mg (has no administration in time range)  lisinopril (PRINIVIL,ZESTRIL) tablet 20 mg (has no administration in time range)  enoxaparin (LOVENOX) injection 40 mg (has no administration in time range)  sodium chloride 0.9 % bolus 1,000 mL (0 mLs Intravenous Stopped 03/15/18 1515)    And  sodium chloride 0.9 % bolus 1,000 mL (0 mLs Intravenous Stopped 03/15/18 1515)    And  sodium chloride 0.9 % bolus 1,000 mL (0 mLs Intravenous Stopped 03/15/18 1659)    And  sodium chloride 0.9 % bolus 500 mL (0 mLs Intravenous Stopped 03/15/18 1700)  vancomycin (VANCOCIN) 2,500 mg in sodium chloride 0.9 % 500 mL IVPB (0 mg Intravenous Stopped 03/15/18 1700)    Mobility non-ambulatory

## 2018-03-15 NOTE — ED Notes (Signed)
Bed: [facility] Expected date:  Expected time:  Means of arrival:  Comments: EMS-cellulitis

## 2018-03-15 NOTE — ED Notes (Signed)
Coming from PCP office, states LLE cellulitis and infection-not responding to antibiobics

## 2018-03-15 NOTE — H&P (Signed)
History and Physical    Joel Solis IEP:[id number] DOB: [birth year] DOA: 03/15/2018  PCP: [facility]  Patient coming from: [facility]    Chief Complaint: Left lower extremity swelling and pain HPI: Joel Solis is a 72 y.o. male with medical history significant of stroke history of VP shunt hypertension, recurrent lower extremity cellulitis and bilateral lymphedema he was admitted to [facility] with left lower extremity cellulitis discharged on p.o. antibiotics to a rehab which he thinks is [facility] where he got p.o. antibiotics for 5 days without any improvement.  He noticed his leg is more swollen tender and erythematous.  He denies fever chills nausea vomiting or diarrhea.  Denies urinary complaints denies abdominal pain.  He was given Rocephin IM yesterday with doxycycline twice a day without any improvement. Patient had increased ammonia level during his last hospital stay unclear reason. ED Course: Patient was given vancomycin and rocephin in the ER.  Vital signs blood pressure 06/06/1967 pulse 103 respiration 23 sats 99% on room air.  Labs significant for sodium 143 potassium 3.2 creatinine 1.05 alkaline phosphatase is 165 other LFTs are normal white count is 10.5 hemoglobin 15.1, platelet count Review of Systems: See HPI  Past Medical History:  Diagnosis Date  . Essential hypertension   . Hemorrhagic stroke ([facility])   . HLD (hyperlipidemia)   . S/P VP shunt   . Tobacco abuse     Past Surgical History:  Procedure Laterality Date  . cyst      cyst in neck  . VENTRICULOPERITONEAL SHUNT       reports that he has been smoking. He has never used smokeless tobacco. He reports that he drinks alcohol. He reports that he does not use drugs.  Allergies  Allergen Reactions  . Shellfish Allergy Nausea And Vomiting    Family History  Problem Relation Age of Onset  . Intracerebral hemorrhage Mother   . Lung cancer Father   .  Esophageal cancer Brother      Prior to Admission medications   Medication Sig Start Date End Date Taking? Authorizing Provider  amLODipine (NORVASC) 10 MG tablet Take 10 mg by mouth daily.   Yes [provider]  atorvastatin (LIPITOR) 40 MG tablet Take 40 mg by mouth daily.   Yes [provider]  baclofen (LIORESAL) 10 MG tablet Take 10 mg by mouth 3 (three) times daily.   Yes [provider]  doxycycline (VIBRAMYCIN) 100 MG capsule Take 100 mg by mouth 2 (two) times daily. 03/14/18  Yes [provider]  furosemide (LASIX) 20 MG tablet Take 20 mg by mouth daily.    Yes [provider]  ibuprofen (ADVIL,MOTRIN) 200 MG tablet Take 400 mg by mouth every 6 (six) hours as needed for moderate pain.   Yes [provider]  lisinopril (PRINIVIL,ZESTRIL) 20 MG tablet Take 20 mg by mouth daily.   Yes [provider]  metoprolol tartrate (LOPRESSOR) 100 MG tablet Take 100 mg by mouth 2 (two) times daily.   Yes [provider]  potassium chloride (MICRO-K) 10 MEQ CR capsule Take 10 mEq by mouth daily.   Yes [provider]  lactulose (CHRONULAC) 10 GM/15ML solution Take 30 mLs (20 g total) by mouth daily. Patient not taking: Reported on 03/15/2018 02/08/18   Darlin Drop, DO    Physical Exam: Vitals:   03/15/18 1033 03/15/18 1039 03/15/18 1041 03/15/18 1424  BP:  (!) 159/100  115/68  Pulse:  85  (!)  103  Resp:    (!) 23  Temp:  98 F (36.7 C)    TempSrc:  Oral    SpO2: 98% 99%  99%  Weight:   111.1 kg   Height:   5\' 9"  (1.753 m)     Constitutional: NAD, calm, comfortable Vitals:   03/15/18 1033 03/15/18 1039 03/15/18 1041 03/15/18 1424  BP:  (!) 159/100  115/68  Pulse:  85  (!) 103  Resp:    (!) 23  Temp:  98 F (36.7 C)    TempSrc:  Oral    SpO2: 98% 99%  99%  Weight:   111.1 kg   Height:   5\' 9"  (1.753 m)    Eyes: PERRL, lids and conjunctivae normal ENMT: Mucous membranes are moist. Posterior  pharynx clear of any exudate or lesions.Normal dentition.  Neck: normal, supple, no masses, no thyromegaly Respiratory: clear to auscultation bilaterally, no wheezing, no crackles. Normal respiratory effort. No accessory muscle use.  Cardiovascular: Regular rate and rhythm, no murmurs / rubs / gallops. No extremity edema. 2+ pedal pulses. No carotid bruits.  Abdomen: no tenderness, no masses palpated. No hepatosplenomegaly. Bowel sounds positive.  Musculoskeletal: Left lower extremity 2+ edema and erythema Skin: no rashes, lesions, ulcers. No induration Neurologic: CN 2-12 grossly intact. Sensation intact, DTR normal. Strength 5/5 in all 4.  Psychiatric: Normal judgment and insight. Alert and oriented x 3. Normal mood.    Labs on Admission: I have personally reviewed following labs and imaging studies  CBC: Recent Labs  Lab 03/15/18 1056  WBC 10.5  NEUTROABS 6.6  HGB 15.1  HCT 50.3  MCV 88.1  PLT 300   Basic Metabolic Panel: Recent Labs  Lab 03/15/18 1056  NA 143  K 3.2*  CL 108  CO2 26  GLUCOSE 89  BUN 16  CREATININE 1.05  CALCIUM 8.5*   GFR: Estimated Creatinine Clearance: 78.2 mL/min (by C-G formula based on SCr of 1.05 mg/dL). Liver Function Tests: Recent Labs  Lab 03/15/18 1056  AST 21  ALT 19  ALKPHOS 165*  BILITOT 0.5  PROT 7.9  ALBUMIN 3.6   No results for input(s): LIPASE, AMYLASE in the last 168 hours. No results for input(s): AMMONIA in the last 168 hours. Coagulation Profile: No results for input(s): INR, PROTIME in the last 168 hours. Cardiac Enzymes: No results for input(s): CKTOTAL, CKMB, CKMBINDEX, TROPONINI in the last 168 hours. BNP (last 3 results) No results for input(s): PROBNP in the last 8760 hours. HbA1C: No results for input(s): HGBA1C in the last 72 hours. CBG: No results for input(s): GLUCAP in the last 168 hours. Lipid Profile: No results for input(s): CHOL, HDL, LDLCALC, TRIG, CHOLHDL, LDLDIRECT in the last 72 hours. Thyroid  Function Tests: No results for input(s): TSH, T4TOTAL, FREET4, T3FREE, THYROIDAB in the last 72 hours. Anemia Panel: No results for input(s): VITAMINB12, FOLATE, FERRITIN, TIBC, IRON, RETICCTPCT in the last 72 hours. Urine analysis:    Component Value Date/Time   COLORURINE YELLOW 03/15/2018 1125   APPEARANCEUR CLEAR 03/15/2018 1125   LABSPEC 1.020 03/15/2018 1125   PHURINE 5.0 03/15/2018 1125   GLUCOSEU NEGATIVE 03/15/2018 1125   HGBUR NEGATIVE 03/15/2018 1125   BILIRUBINUR NEGATIVE 03/15/2018 1125   KETONESUR NEGATIVE 03/15/2018 1125   PROTEINUR NEGATIVE 03/15/2018 1125   NITRITE NEGATIVE 03/15/2018 1125   LEUKOCYTESUR NEGATIVE 03/15/2018 1125    Radiological Exams on Admission: Dg Chest Port 1 View  Result Date: 03/15/2018 CLINICAL DATA:  72 year old male with sepsis  EXAM: PORTABLE CHEST 1 VIEW COMPARISON:  11/15/2016 radiograph FINDINGS: The cardiomediastinal silhouette is unremarkable. Mild LEFT basilar opacity likely represents atelectasis. A VP shunt is again noted overlying the RIGHT chest and abdomen. There is no evidence of focal airspace disease, pulmonary edema, suspicious pulmonary nodule/mass, pleural effusion, or pneumothorax. No acute bony abnormalities are identified. IMPRESSION: Mild LEFT basilar opacity-likely atelectasis. No other significant findings. Electronically Signed   By: Harmon Pier M.D.   On: 03/15/2018 13:48   Dg Foot Complete Left  Result Date: 03/15/2018 CLINICAL DATA:  Cellulitis of the left lower extremity, not responding to antibiotics EXAM: LEFT FOOT - COMPLETE 3+ VIEW COMPARISON:  Left foot films of 02/02/2017 FINDINGS: The bones of the left foot are diffusely osteopenic. No acute fracture is seen. There is degenerative change at the left first MTP joint and there is soft tissue swelling over the dorsum. IMPRESSION: Osteopenia.  No acute fracture. Electronically Signed   By: Dwyane Dee M.D.   On: 03/15/2018 13:46    Assessment/Plan Active  Problems:   * No active hospital problems. * #1 recurrent left lower extremity cellulitis with history of bilateral lymphedema recent hospital admission for the same failed outpatient treatment with doxycycline.  We will start him on vancomycin and cefepime pharmacy consulted.  Patient has a history of Pseudomonas in wound culture 01/2018.follow up blood cultures.  #2htn he is on triple antihypertensive.  Blood pressure is soft I will restart only ACE inhibitor and Lasix at this time.  DVT prophylaxis:lovenox Code Status:full Family Communicationnone Disposition Plan: pending clinical improvement Consults called:noneAdmission status: obs Alwyn Ren MD [facility]  If 7PM-7AM, please contact night-coverage [email or web address] Password [email or web address]  03/15/2018, 4:12 PM

## 2018-03-15 NOTE — ED Provider Notes (Signed)
[facility] DEPT Provider Note   CSN: [id number] Arrival date & time: 03/15/18  1027     History   Chief Complaint Chief Complaint  Patient presents with  . Cellulitits    HPI Joel Solis is a 72 y.o. male.  HPI   He complains of cellulitis for several days, with a sore on his foot.  He has been seeing his PCP, who was treated yesterday with IM Rocephin and given a prescription which she has not yet started.  He denies known trauma to the foot.  He has a history of same, requiring hospitalization, several weeks ago.  He denies fever, chills, nausea, vomiting, cough, shortness of breath, weakness or dizziness.  He is taking his usual medicines as prescribed.  He has not yet eaten today.  There are no other known modifying factors.  Past Medical History:  Diagnosis Date  . Essential hypertension   . Hemorrhagic stroke ([place])   . HLD (hyperlipidemia)   . S/P VP shunt   . Tobacco abuse     Patient Active Problem List   Diagnosis Date Noted  . Lower extremity cellulitis 02/02/2018  . Infected superficial injury of toe of right foot 02/02/2018  . Cellulitis 02/02/2018  . AKI (acute kidney injury) ([place]) 11/22/2016  . Inneffective medication administration routine at home 11/22/2016  . Dirty living conditions 11/22/2016  . Elevated troponin 11/16/2016  . Acute metabolic encephalopathy 43/27/6147  . Hypokalemia 11/15/2016  . Essential hypertension   . HLD (hyperlipidemia)   . 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 S/P VP shunt   . Tobacco abuse     Past Surgical History:  Procedure Laterality Date  . cyst      cyst in neck  . VENTRICULOPERITONEAL SHUNT          Home Medications    Prior to Admission medications   Medication Sig Start Date End Date Taking? Authorizing Provider  amLODipine (NORVASC) 10 MG tablet Take 10 mg by mouth daily.   Yes [provider]  atorvastatin  (LIPITOR) 40 MG tablet Take 40 mg by mouth daily.   Yes [provider]  baclofen (LIORESAL) 10 MG tablet Take 10 mg by mouth 3 (three) times daily.   Yes [provider]  doxycycline (VIBRAMYCIN) 100 MG capsule Take 100 mg by mouth 2 (two) times daily. 03/14/18  Yes [provider]  furosemide (LASIX) 20 MG tablet Take 20 mg by mouth daily.    Yes [provider]  ibuprofen (ADVIL,MOTRIN) 200 MG tablet Take 400 mg by mouth every 6 (six) hours as needed for moderate pain.   Yes [provider]  lisinopril (PRINIVIL,ZESTRIL) 20 MG tablet Take 20 mg by mouth daily.   Yes [provider]  metoprolol tartrate (LOPRESSOR) 100 MG tablet Take 100 mg by mouth 2 (two) times daily.   Yes [provider]  potassium chloride (MICRO-K) 10 MEQ CR capsule Take 10 mEq by mouth daily.   Yes [provider]  lactulose (CHRONULAC) 10 GM/15ML solution Take 30 mLs (20 g total) by mouth daily. Patient not taking: Reported on 03/15/2018 02/08/18   Kayleen Memos, DO    Family History Family History  Problem Relation Age of Onset  . Intracerebral hemorrhage Mother   . Lung cancer Father   . Esophageal cancer Brother     Social History Social History   Tobacco Use  . Smoking status: Current Every Day Smoker  .  Smokeless tobacco: Never Used  Substance Use Topics  . Alcohol use: Yes    Comment: not drank in 10 months  . Drug use: No     Allergies   Shellfish allergy   Review of Systems Review of Systems  All other systems reviewed and are negative.    Physical Exam Updated Vital Signs BP 115/68   Pulse (!) 103   Temp 98 F (36.7 C) (Oral)   Resp (!) 23   Ht 5' 9"  (1.753 m)   Wt 111.1 kg   SpO2 99%   BMI 36.18 kg/m   Physical Exam  Constitutional: He is oriented to person, place, and time. He appears well-developed and well-nourished. No distress.  HENT:  Head: Normocephalic and atraumatic.  Right Ear: External ear  normal.  Left Ear: External ear normal.  Eyes: Pupils are equal, round, and reactive to light. Conjunctivae and EOM are normal.  Neck: Normal range of motion and phonation normal. Neck supple.  Cardiovascular: Normal rate, regular rhythm and normal heart sounds.  Pulmonary/Chest: Effort normal and breath sounds normal. He exhibits no bony tenderness.  Abdominal: Soft. There is no tenderness.  Musculoskeletal:  Left lower leg tender, swollen, red from mid shin to toes.  Ulcer right great toe.  Neurological: He is alert and oriented to person, place, and time. No cranial nerve deficit or sensory deficit. He exhibits normal muscle tone. Coordination normal.  Skin: Skin is warm, dry and intact.  Abrasions of left first and second toes, are likely portals for infection.  No significant ulceration, drainage or bleeding associated with these wounds.  Psychiatric: He has a normal mood and affect. His behavior is normal. Judgment and thought content normal.  Nursing note and vitals reviewed.    ED Treatments / Results  Labs (all labs ordered are listed, but only abnormal results are displayed) Labs Reviewed  COMPREHENSIVE METABOLIC PANEL - Abnormal; Notable for the following components:      Result Value   Potassium 3.2 (*)    Calcium 8.5 (*)    Alkaline Phosphatase 165 (*)    All other components within normal limits  CULTURE, BLOOD (ROUTINE X 2)  CULTURE, BLOOD (ROUTINE X 2)  URINE CULTURE  CBC WITH DIFFERENTIAL/PLATELET  URINALYSIS, ROUTINE W REFLEX MICROSCOPIC  I-STAT CG4 LACTIC ACID, ED  I-STAT CG4 LACTIC ACID, ED    EKG None  Radiology Dg Chest Port 1 View  Result Date: 03/15/2018 CLINICAL DATA:  72 year old male with sepsis EXAM: PORTABLE CHEST 1 VIEW COMPARISON:  11/15/2016 radiograph FINDINGS: The cardiomediastinal silhouette is unremarkable. Mild LEFT basilar opacity likely represents atelectasis. A VP shunt is again noted overlying the RIGHT chest and abdomen. There is no  evidence of focal airspace disease, pulmonary edema, suspicious pulmonary nodule/mass, pleural effusion, or pneumothorax. No acute bony abnormalities are identified. IMPRESSION: Mild LEFT basilar opacity-likely atelectasis. No other significant findings. Electronically Signed   By: Margarette Canada M.D.   On: 03/15/2018 13:48   Dg Foot Complete Left  Result Date: 03/15/2018 CLINICAL DATA:  Cellulitis of the left lower extremity, not responding to antibiotics EXAM: LEFT FOOT - COMPLETE 3+ VIEW COMPARISON:  Left foot films of 02/02/2017 FINDINGS: The bones of the left foot are diffusely osteopenic. No acute fracture is seen. There is degenerative change at the left first MTP joint and there is soft tissue swelling over the dorsum. IMPRESSION: Osteopenia.  No acute fracture. Electronically Signed   By: Ivar Drape M.D.   On: 03/15/2018 13:46  Procedures .Critical Care Performed by: Daleen Bo, MD Authorized by: Daleen Bo, MD   Critical care provider statement:    Critical care time (minutes):  35   Critical care start time:  03/15/2018 11:15 AM   Critical care end time:  03/15/2018 3:48 PM   Critical care time was exclusive of:  Separately billable procedures and treating other patients   Critical care was necessary to treat or prevent imminent or life-threatening deterioration of the following conditions:  Sepsis   Critical care was time spent personally by me on the following activities:  Blood draw for specimens, development of treatment plan with patient or surrogate, discussions with consultants, evaluation of patient's response to treatment, examination of patient, obtaining history from patient or surrogate, ordering and performing treatments and interventions, ordering and review of laboratory studies, pulse oximetry, re-evaluation of patient's condition, review of old charts and ordering and review of radiographic studies   (including critical care time)  Medications Ordered in  ED Medications  cefTRIAXone (ROCEPHIN) 2 g in sodium chloride 0.9 % 100 mL IVPB (0 g Intravenous Stopped 03/15/18 1241)  sodium chloride 0.9 % bolus 1,000 mL (0 mLs Intravenous Stopped 03/15/18 1515)    And  sodium chloride 0.9 % bolus 1,000 mL (0 mLs Intravenous Stopped 03/15/18 1515)    And  sodium chloride 0.9 % bolus 1,000 mL (1,000 mLs Intravenous New Bag/Given 03/15/18 1211)    And  sodium chloride 0.9 % bolus 500 mL (500 mLs Intravenous New Bag/Given 03/15/18 1221)  vancomycin (VANCOCIN) 2,500 mg in sodium chloride 0.9 % 500 mL IVPB (2,500 mg Intravenous New Bag/Given 03/15/18 1317)     Initial Impression / Assessment and Plan / ED Course  I have reviewed the triage vital signs and the nursing notes.  Pertinent labs & imaging results that were available during my care of the patient were reviewed by me and considered in my medical decision making (see chart for details).  Clinical Course as of Mar 16 1547  Thu Mar 15, 2018  1449 Normal except potassium low, calcium low, alk phos stays high  Comprehensive metabolic panel(!) [EW]  4580 Normal  CBC WITH DIFFERENTIAL [EW]  1449 Normal  Urinalysis, Routine w reflex microscopic (not at [facility]) [EW]  1450 No infiltrate, images reviewed by me  DG Chest Port 1 View [EW]  1450 No osteomyelitis, images reviewed by me  DG Foot Complete Left [EW]    Clinical Course User Index [EW] Daleen Bo, MD     Patient Vitals for the past 24 hrs:  BP Temp Temp src Pulse Resp SpO2 Height Weight  03/15/18 1424 115/68 - - (!) 103 (!) 23 99 % - -  03/15/18 1041 - - - - - - 5' 9"  (1.753 m) 111.1 kg  03/15/18 1039 (!) 159/100 98 F (36.7 C) Oral 85 - 99 % - -  03/15/18 1033 - - - - - 98 % - -    2:52 PM Reevaluation with update and discussion. After initial assessment and treatment, an updated evaluation reveals no change in clinical status.  He remains comfortable.  Findings discussed with patient and all questions answered. [place] Decision Making: Cellulitis left leg, failed outpatient treatment, requiring hospitalization for further treatment.  CRITICAL CARE-yes Performed by: Daleen Bo  Nursing Notes Reviewed/ Care Coordinated Applicable Imaging Reviewed Interpretation of Laboratory Data incorporated into ED treatment   3:17 PM-Consult complete with hospitalist. Patient case explained and discussed.  She agrees to  admit patient for further evaluation and treatment. Call ended at 3:25 PM  Final Clinical Impressions(s) / ED Diagnoses   Final diagnoses:  Cellulitis of left lower extremity    ED Discharge Orders    None       Daleen Bo, MD 03/15/18 1549

## 2018-03-16 ENCOUNTER — Inpatient Hospital Stay (HOSPITAL_COMMUNITY): Payer: Medicare Other

## 2018-03-16 DIAGNOSIS — Z982 Presence of cerebrospinal fluid drainage device: Secondary | ICD-10-CM | POA: Diagnosis not present

## 2018-03-16 DIAGNOSIS — I739 Peripheral vascular disease, unspecified: Secondary | ICD-10-CM | POA: Diagnosis not present

## 2018-03-16 DIAGNOSIS — I890 Lymphedema, not elsewhere classified: Secondary | ICD-10-CM | POA: Diagnosis not present

## 2018-03-16 DIAGNOSIS — Z800 Family history of malignant neoplasm of digestive organs: Secondary | ICD-10-CM | POA: Diagnosis not present

## 2018-03-16 DIAGNOSIS — I10 Essential (primary) hypertension: Secondary | ICD-10-CM | POA: Diagnosis not present

## 2018-03-16 DIAGNOSIS — F17200 Nicotine dependence, unspecified, uncomplicated: Secondary | ICD-10-CM | POA: Diagnosis not present

## 2018-03-16 DIAGNOSIS — E785 Hyperlipidemia, unspecified: Secondary | ICD-10-CM | POA: Diagnosis not present

## 2018-03-16 DIAGNOSIS — L03116 Cellulitis of left lower limb: Secondary | ICD-10-CM | POA: Diagnosis present

## 2018-03-16 DIAGNOSIS — E876 Hypokalemia: Secondary | ICD-10-CM | POA: Diagnosis not present

## 2018-03-16 DIAGNOSIS — Z91013 Allergy to seafood: Secondary | ICD-10-CM | POA: Diagnosis not present

## 2018-03-16 DIAGNOSIS — I69354 Hemiplegia and hemiparesis following cerebral infarction affecting left non-dominant side: Secondary | ICD-10-CM | POA: Diagnosis not present

## 2018-03-16 DIAGNOSIS — Z791 Long term (current) use of non-steroidal anti-inflammatories (NSAID): Secondary | ICD-10-CM | POA: Diagnosis not present

## 2018-03-16 DIAGNOSIS — Z79899 Other long term (current) drug therapy: Secondary | ICD-10-CM | POA: Diagnosis not present

## 2018-03-16 DIAGNOSIS — Z801 Family history of malignant neoplasm of trachea, bronchus and lung: Secondary | ICD-10-CM | POA: Diagnosis not present

## 2018-03-16 LAB — CREATININE, SERUM: CREATININE: 0.92 mg/dL (ref 0.61–1.24)

## 2018-03-16 LAB — URINE CULTURE

## 2018-03-16 NOTE — Progress Notes (Signed)
PROGRESS NOTE    Joel Solis  GEX:[id number] DOB: [birth year] DOA: 03/15/2018 PCP: [facility]   Brief Narrative: Patient is a 72 year old male with past medical history of stroke with left-sided hemiparesis, VP shunt, hypertension, recurrent lower extremity cellulitis was recently admitted and was discharged on oral antibiotics from [facility] after treatment for left lower extremity cellulitis.  Patient was discharged to rehab but had to return to the emergency department here without any improvement.  He noticed that his leg was more swollen and erythematous.  Admitted for further management.  Started on antibiotics.  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  Also history of bilateral lymphedema.  Failed outpatient treatment with doxycycline.  Started on vancomycin and cefepime here.  Has history of Pseudomonas and wound cultures were 9/19.  Blood cultures have been sent. Left lower extremity looks significantly better today.  Less erythematous and there is no edema. It was difficult to feel the peripheral pulses in bilateral lower extremity.  Will check arterial Doppler with ABI. Patient has ulcers/abrasions on his left big toe and second toe.  This could be the source of cellulitis.  Patient ambulates with the help of wheelchair.  He says he often accidentally hits his leg on the walls.  Hypertension: Currently blood pressure stable.  Continue current medications.   DVT prophylaxis: Lovenox Code Status: Full Family Communication: None present at the bedside Disposition Plan: Likely home tomorrow with oral antibiotics   Consultants: None  Procedures: None  Antimicrobials: Vancomycin cefepime  Subjective: Patient seen and examined the bedside this morning.  His left lower extremity looks significantly better today.  Less erythematous/  edematous  Objective: Vitals:   03/15/18 1630 03/15/18 1645 03/15/18 2117 03/16/18 0448  BP: (!) 149/87 (!) 143/84 (!) 161/67 (!) 141/64  Pulse: 100 97 92 75  Resp: 19 (!) 21 18 17   Temp:   98.8 F (37.1 C) 97.9 F (36.6 C)  TempSrc:   Oral Oral  SpO2: 95% 99% 94% 97%  Weight:      Height:        Intake/Output Summary (Last 24 hours) at 03/16/2018 1137 Last data filed at 03/16/2018 0600 Gross per 24 hour  Intake 4789.95 ml  Output 2400 ml  Net 2389.95 ml   Filed Weights   03/15/18 1041  Weight: 111.1 kg    Examination:  General exam: Appears calm and comfortable ,Not in distress,average built HEENT:PERRL,Oral mucosa moist, Ear/Nose normal on gross exam Respiratory system: Bilateral equal air entry, normal vesicular breath sounds, no wheezes or crackles  Cardiovascular system: S1 & S2 heard, RRR. No JVD, murmurs, rubs, gallops or clicks. No pedal edema. Gastrointestinal system: Abdomen is nondistended, soft and nontender. No organomegaly or masses felt. Normal bowel sounds heard. Central nervous system: Alert and oriented. Left hemiparesis Extremities: No edema, no clubbing ,no cyanosis,poor peripheral pulses .  Erythema of the left lower extremity Skin: Abrasion/shallow ulcers on toes of left foot,no icterus ,no pallor MSK: Normal muscle bulk,tone ,power Psychiatry: Judgement and insight appear normal. Mood & affect appropriate.     Data Reviewed: I have personally reviewed following labs and imaging studies  CBC: Recent Labs  Lab 03/15/18 1056  WBC 10.5  NEUTROABS 6.6  HGB 15.1  HCT 50.3  MCV 88.1  PLT 300   Basic Metabolic Panel: Recent Labs  Lab 03/15/18 1056 03/16/18 0508  NA 143  --  K 3.2*  --   CL 108  --   CO2 26  --   GLUCOSE 89  --   BUN 16  --   CREATININE 1.05 0.92  CALCIUM 8.5*  --    GFR: Estimated Creatinine Clearance: 89.2 mL/min (by C-G formula based on SCr of 0.92 mg/dL). Liver Function Tests: Recent Labs  Lab  03/15/18 1056  AST 21  ALT 19  ALKPHOS 165*  BILITOT 0.5  PROT 7.9  ALBUMIN 3.6   No results for input(s): LIPASE, AMYLASE in the last 168 hours. No results for input(s): AMMONIA in the last 168 hours. Coagulation Profile: No results for input(s): INR, PROTIME in the last 168 hours. Cardiac Enzymes: No results for input(s): CKTOTAL, CKMB, CKMBINDEX, TROPONINI in the last 168 hours. BNP (last 3 results) No results for input(s): PROBNP in the last 8760 hours. HbA1C: No results for input(s): HGBA1C in the last 72 hours. CBG: No results for input(s): GLUCAP in the last 168 hours. Lipid Profile: No results for input(s): CHOL, HDL, LDLCALC, TRIG, CHOLHDL, LDLDIRECT in the last 72 hours. Thyroid Function Tests: No results for input(s): TSH, T4TOTAL, FREET4, T3FREE, THYROIDAB in the last 72 hours. Anemia Panel: No results for input(s): VITAMINB12, FOLATE, FERRITIN, TIBC, IRON, RETICCTPCT in the last 72 hours. Sepsis Labs: Recent Labs  Lab 03/15/18 1155  LATICACIDVEN 1.39    Recent Results (from the past 240 hour(s))  Blood Culture (routine x 2)     Status: None (Preliminary result)   Collection Time: 03/15/18 11:25 AM  Result Value Ref Range Status   Specimen Description   Final    BLOOD RIGHT ARM Performed at [facility], [address] [place], [place] [address]    Special Requests   Final    BOTTLES DRAWN AEROBIC AND ANAEROBIC Blood Culture results may not be optimal due to an excessive volume of blood received in culture bottles Performed at [facility], [address] [place], [place] [address]    Culture   Final    NO GROWTH < 24 HOURS Performed at [facility], [address] [place], [place] [address]    Report Status PENDING  Incomplete  Urine culture     Status: Abnormal   Collection Time: 03/15/18 11:25 AM  Result Value Ref Range Status   Specimen Description   Final    URINE, RANDOM Performed at [facility], [address], [place], [place] [address]    Special Requests   Final    NONE Performed at [facility], [address], [place], [place] [address]    Culture MULTIPLE SPECIES PRESENT, SUGGEST RECOLLECTION (A)  Final   Report Status 03/16/2018 FINAL  Final  Blood Culture (routine x 2)     Status: None (Preliminary result)   Collection Time: 03/15/18 11:26 AM  Result Value Ref Range Status   Specimen Description   Final    BLOOD RIGHT ARM Performed at [facility], [address], [place], [place] [address]    Special Requests   Final    BOTTLES DRAWN AEROBIC AND ANAEROBIC Blood Culture results may not be optimal due to an excessive volume of blood received in culture bottles   Culture   Final    NO GROWTH < 24 HOURS Performed at [facility], [address] [place], [place] [address]    Report Status PENDING  Incomplete  Radiology Studies: Dg Chest Port 1 View  Result Date: 03/15/2018 CLINICAL DATA:  72 year old male with sepsis EXAM: PORTABLE CHEST 1 VIEW COMPARISON:  11/15/2016 radiograph FINDINGS: The cardiomediastinal silhouette is unremarkable. Mild LEFT basilar opacity likely represents atelectasis. A VP shunt is again noted overlying the RIGHT chest and abdomen. There is no evidence of focal airspace disease, pulmonary edema, suspicious pulmonary nodule/mass, pleural effusion, or pneumothorax. No acute bony abnormalities are identified. IMPRESSION: Mild LEFT basilar opacity-likely atelectasis. No other significant findings. Electronically Signed   By: Harmon Pier M.D.   On: 03/15/2018 13:48   Dg Foot Complete Left  Result Date: 03/15/2018 CLINICAL DATA:  Cellulitis of the left lower extremity, not responding to antibiotics EXAM: LEFT FOOT - COMPLETE 3+ VIEW COMPARISON:  Left foot films of 02/02/2017 FINDINGS: The bones of the left foot are diffusely osteopenic. No acute fracture is seen. There is  degenerative change at the left first MTP joint and there is soft tissue swelling over the dorsum. IMPRESSION: Osteopenia.  No acute fracture. Electronically Signed   By: Dwyane Dee M.D.   On: 03/15/2018 13:46        Scheduled Meds: . atorvastatin  40 mg Oral Daily  . enoxaparin (LOVENOX) injection  40 mg Subcutaneous Q24H  . furosemide  20 mg Oral Daily  . lisinopril  20 mg Oral Daily   Continuous Infusions: . sodium chloride 10 mL/hr at 03/16/18 0600  . ceFEPime (MAXIPIME) IV Stopped (03/16/18 0558)  . vancomycin       LOS: 0 days    Time spent: 25 mins.More than 50% of that time was spent in counseling and/or coordination of care.      Burnadette Pop, MD [facility] Pager [phone]  If 7PM-7AM, please contact night-coverage [email or web address] Password [facility] 03/16/2018, 11:37 AM

## 2018-03-16 NOTE — Progress Notes (Signed)
ABI's have been completed. Right 0.6  Left 0.53  03/16/18 2:36 PM Olen Cordial RVT

## 2018-03-17 DIAGNOSIS — L03116 Cellulitis of left lower limb: Secondary | ICD-10-CM | POA: Diagnosis not present

## 2018-03-17 LAB — BASIC METABOLIC PANEL
ANION GAP: 7 (ref 5–15)
BUN: 16 mg/dL (ref 8–23)
CALCIUM: 8.3 mg/dL — AB (ref 8.9–10.3)
CO2: 24 mmol/L (ref 22–32)
CREATININE: 0.94 mg/dL (ref 0.61–1.24)
Chloride: 108 mmol/L (ref 98–111)
GLUCOSE: 126 mg/dL — AB (ref 70–99)
Potassium: 3.1 mmol/L — ABNORMAL LOW (ref 3.5–5.1)
Sodium: 139 mmol/L (ref 135–145)

## 2018-03-17 MED ORDER — SULFAMETHOXAZOLE-TRIMETHOPRIM 800-160 MG PO TABS: 1.0000 | ORAL_TABLET | Freq: Two times a day (BID) | ORAL | 0 refills | Status: DC

## 2018-03-17 MED ORDER — TRAMADOL HCL 50 MG PO TABS
50.0000 mg | ORAL_TABLET | Freq: Four times a day (QID) | ORAL | Status: DC | PRN
  Administered 2018-03-17 (×2): 50 mg via ORAL
  Filled 2018-03-17 (×2): qty 1

## 2018-03-17 MED ORDER — POTASSIUM CHLORIDE CRYS ER 20 MEQ PO TBCR
40.0000 meq | EXTENDED_RELEASE_TABLET | Freq: Once | ORAL | Status: AC
  Administered 2018-03-17: 40 meq via ORAL
  Filled 2018-03-17: qty 2

## 2018-03-17 MED ORDER — POTASSIUM CHLORIDE ER 10 MEQ PO CPCR: ORAL_CAPSULE | ORAL | 0 refills | Status: DC

## 2018-03-17 NOTE — Progress Notes (Signed)
Patient's daughter called and spoke with RN regarding pt's d/c. Reports patient has a manula wheelchair at home, and cares for himself and is safe to return home by himself today. RN spoke with patient who states his house is open and he will be able to enter the home when transported by [facility]. Copies of discharge medications and instructions provided to patient.

## 2018-03-17 NOTE — Discharge Summary (Signed)
Physician Discharge Summary  Joel Solis UXL:[id number] DOB: Jan 31, 1946 DOA: 03/15/2018  PCP: [facility]  Admit date: 03/15/2018 Discharge date: 03/17/2018  Admitted From: Home Disposition:  Home  Discharge Condition:Stable CODE STATUS:FULL Diet recommendation: Heart Healthy   Brief/Interim Summary: Patient is a 72 year old male with past medical history of stroke with left-sided hemiparesis, VP shunt, hypertension, recurrent lower extremity cellulitis was recently admitted and was discharged on oral antibiotics from [facility] after treatment for left lower extremity cellulitis.  Patient was discharged  but had to return to the emergency department here without any improvement.  He noticed that his leg was more swollen and erythematous.  Admitted for further management.  Started on antibiotics.  Patient was found to have poor peripheral pulses on his bilateral lower extremities.  Arterial duplex showed bilateral moderate arterial disease.  Vascular surgery was consulted on phone and he will follow-up as an outpatient for arteriogram and for possible further interventions.  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  Failed outpatient treatment with doxycycline.  Started on vancomycin and cefepime here.  Has history of Pseudomonas on wound cultures on 9/19.  Blood cultures have been sent and have remained negative. Left lower extremity looks significantly better today.  Less erythematous and there is no edema.  Peripheral vascular disease: It was difficult to feel the peripheral pulses in bilateral lower extremity.  Arterial Doppler with ABI of bilateral lower extremities moderate arterial disease.  Discussed with vascular surgery Dr. Chestine Spore who will see him as an outpatient for arteriogram and possible further  interventions. Patient has ulcers/abrasions on his left big toe and second toe.  This could be the source of cellulitis.  Patient ambulates with the help of wheelchair.  He says he often accidentally hits his leg on the walls.  Hypertension: Currently blood pressure stable .  He is on different home medications which might need to be cut down if his blood pressure remains soft.Lasix will be discontinued because of hypokalemia and he does not have any peripheral edema..  Hypokalemia: Supplemented with potassium.  Check BMP in a week during the follow-up with PCP    Discharge Diagnoses:  Active Problems:   Cellulitis    Discharge Instructions  Discharge Instructions    Diet - low sodium heart healthy   Complete by:  As directed    Discharge instructions   Complete by:  As directed    1)Take prescribed medications as instructed. 2)Follow up with your PCP in a week.  Do a CBC, BMP test during the follow-up. 3)Follow up with vascular surgery, Dr. Chestine Spore as an outpatient.  You will be called for the appointment. 4) Monitor your blood pressure at home.  If your blood pressure remains normal or low you may need to cut down on your medications.  Follow-up with your PCP for this.  We have discontinued Lasix because of persistent hypokalemia.   Increase activity slowly   Complete by:  As directed      Allergies as of 03/17/2018      Reactions   Shellfish Allergy Nausea And Vomiting      Medication List    STOP taking these medications   doxycycline 100 MG capsule Commonly known as:  VIBRAMYCIN   furosemide 20 MG tablet Commonly known as:  LASIX   lactulose 10 GM/15ML solution Commonly known as:  CHRONULAC  TAKE these medications   amLODipine 10 MG tablet Commonly known as:  NORVASC Take 10 mg by mouth daily.   atorvastatin 40 MG tablet Commonly known as:  LIPITOR Take 40 mg by mouth daily.   baclofen 10 MG tablet Commonly known as:  LIORESAL Take 10 mg by mouth 3  (three) times daily.   ibuprofen 200 MG tablet Commonly known as:  ADVIL,MOTRIN Take 400 mg by mouth every 6 (six) hours as needed for moderate pain.   lisinopril 20 MG tablet Commonly known as:  PRINIVIL,ZESTRIL Take 20 mg by mouth daily.   metoprolol tartrate 100 MG tablet Commonly known as:  LOPRESSOR Take 100 mg by mouth 2 (two) times daily.   potassium chloride 10 MEQ CR capsule Commonly known as:  MICRO-K Take 2 pills daily for 5 days then continue taking 1 pill a day Start taking on:  03/18/2018 What changed:    how much to take  how to take this  when to take this  additional instructions   sulfamethoxazole-trimethoprim 800-160 MG tablet Commonly known as:  BACTRIM DS,SEPTRA DS Take 1 tablet by mouth 2 (two) times daily.      Follow-up Information    [facility]. Schedule an appointment as soon as possible for a visit in 1 week(s).   Specialty:  Family Medicine Contact information: [address] [place] [address] [place] [address] [phone]        Cephus Shelling, MD Follow up in 1 week(s).   Specialty:  Vascular Surgery Why:  You will be called for the appointment Contact information: [address] [place] [address] [phone]          Allergies  Allergen Reactions  . Shellfish Allergy Nausea And Vomiting    Consultations:  Vascular surgery   Procedures/Studies: Dg Chest Port 1 View  Result Date: 03/15/2018 CLINICAL DATA:  72 year old male with sepsis EXAM: PORTABLE CHEST 1 VIEW COMPARISON:  11/15/2016 radiograph FINDINGS: The cardiomediastinal silhouette is unremarkable. Mild LEFT basilar opacity likely represents atelectasis. A VP shunt is again noted overlying the RIGHT chest and abdomen. There is no evidence of focal airspace disease, pulmonary edema, suspicious pulmonary nodule/mass, pleural effusion, or pneumothorax. No acute bony abnormalities are identified. IMPRESSION: Mild LEFT basilar  opacity-likely atelectasis. No other significant findings. Electronically Signed   By: Harmon Pier M.D.   On: 03/15/2018 13:48   Dg Foot Complete Left  Result Date: 03/15/2018 CLINICAL DATA:  Cellulitis of the left lower extremity, not responding to antibiotics EXAM: LEFT FOOT - COMPLETE 3+ VIEW COMPARISON:  Left foot films of 02/02/2017 FINDINGS: The bones of the left foot are diffusely osteopenic. No acute fracture is seen. There is degenerative change at the left first MTP joint and there is soft tissue swelling over the dorsum. IMPRESSION: Osteopenia.  No acute fracture. Electronically Signed   By: Dwyane Dee M.D.   On: 03/15/2018 13:46       Subjective: Patient was seen and examined the bedside.  Remains comfortable.  Left lower extremity cellulitis  has significantly improved.  Discharge planning discussed at the bedside and he verbalized the understanding.  Discharge Exam: Vitals:   03/16/18 2039 03/17/18 0401  BP: 122/60 120/72  Pulse: 75 72  Resp: 16 14  Temp: 98.2 F (36.8 C) 98.4 F (36.9 C)  SpO2: 97% 98%   Vitals:   03/16/18 0448 03/16/18 1408 03/16/18 2039 03/17/18 0401  BP: (!) 141/64 (!) 160/88 122/60 120/72  Pulse: 75  81 75 72  Resp: 17 16 16 14   Temp: 97.9 F (36.6 C) 98.4 F (36.9 C) 98.2 F (36.8 C) 98.4 F (36.9 C)  TempSrc: Oral Oral Oral Oral  SpO2: 97% 96% 97% 98%  Weight:      Height:        General: Pt is alert, awake, not in acute distress Cardiovascular: RRR, S1/S2 +, no rubs, no gallops Respiratory: CTA bilaterally, no wheezing, no rhonchi Abdominal: Soft, NT, ND, bowel sounds + Extremities: no edema, no cyanosis, poor bilateral lower extremity pulses, mild erythema of the left lower extremity, Shallow ulcers on the left great toe and 2nd  toe.    The results of significant diagnostics from this hospitalization (including imaging, microbiology, ancillary and laboratory) are listed below for reference.     Microbiology: Recent Results  (from the past 240 hour(s))  Blood Culture (routine x 2)     Status: None (Preliminary result)   Collection Time: 03/15/18 11:25 AM  Result Value Ref Range Status   Specimen Description   Final    BLOOD RIGHT ARM Performed at [facility], [address], [place], [place] [address]    Special Requests   Final    BOTTLES DRAWN AEROBIC AND ANAEROBIC Blood Culture results may not be optimal due to an excessive volume of blood received in culture bottles Performed at [facility], [address], [place], [place] [address]    Culture   Final    NO GROWTH 2 DAYS Performed at [facility], [address], [place], [place] [address]    Report Status PENDING  Incomplete  Urine culture     Status: Abnormal   Collection Time: 03/15/18 11:25 AM  Result Value Ref Range Status   Specimen Description   Final    URINE, RANDOM Performed at [facility], [address], [place], [place] [address]    Special Requests   Final    NONE Performed at [facility], [address], [place], [place] [address]    Culture MULTIPLE SPECIES PRESENT, SUGGEST RECOLLECTION (A)  Final   Report Status 03/16/2018 FINAL  Final  Blood Culture (routine x 2)     Status: None (Preliminary result)   Collection Time: 03/15/18 11:26 AM  Result Value Ref Range Status   Specimen Description   Final    BLOOD RIGHT ARM Performed at [facility], [address], [place], [place] [address]    Special Requests   Final    BOTTLES DRAWN AEROBIC AND ANAEROBIC Blood Culture results may not be optimal due to an excessive volume of blood received in culture bottles   Culture   Final    NO GROWTH 2 DAYS Performed at [facility], [address] [place], [place] [address]    Report Status PENDING  Incomplete     Labs: BNP (last 3 results) No results for input(s): BNP in the last 8760 hours. Basic Metabolic  Panel: Recent Labs  Lab 03/15/18 1056 03/16/18 0508 03/17/18 0319  NA 143  --  139  K 3.2*  --  3.1*  CL 108  --  108  CO2 26  --  24  GLUCOSE 89  --  126*  BUN 16  --  16  CREATININE 1.05 0.92 0.94  CALCIUM 8.5*  --  8.3*   Liver Function Tests: Recent Labs  Lab 03/15/18 1056  AST 21  ALT 19  ALKPHOS 165*  BILITOT 0.5  PROT 7.9  ALBUMIN 3.6   No results for input(s): LIPASE, AMYLASE in the last 168 hours. No results for input(s): AMMONIA in the last 168 hours. CBC: Recent Labs  Lab 03/15/18 1056  WBC 10.5  NEUTROABS 6.6  HGB 15.1  HCT 50.3  MCV 88.1  PLT 300   Cardiac Enzymes: No results for input(s): CKTOTAL, CKMB, CKMBINDEX, TROPONINI in the last 168 hours. BNP: Invalid input(s): POCBNP CBG: No results for input(s): GLUCAP in the last 168 hours. D-Dimer No results for input(s): DDIMER in the last 72 hours. Hgb A1c No results for input(s): HGBA1C in the last 72 hours. Lipid Profile No results for input(s): CHOL, HDL, LDLCALC, TRIG, CHOLHDL, LDLDIRECT in the last 72 hours. Thyroid function studies No results for input(s): TSH, T4TOTAL, T3FREE, THYROIDAB in the last 72 hours.  Invalid input(s): FREET3 Anemia work up No results for input(s): VITAMINB12, FOLATE, FERRITIN, TIBC, IRON, RETICCTPCT in the last 72 hours. Urinalysis    Component Value Date/Time   COLORURINE YELLOW 03/15/2018 1125   APPEARANCEUR CLEAR 03/15/2018 1125   LABSPEC 1.020 03/15/2018 1125   PHURINE 5.0 03/15/2018 1125   GLUCOSEU NEGATIVE 03/15/2018 1125   HGBUR NEGATIVE 03/15/2018 1125   BILIRUBINUR NEGATIVE 03/15/2018 1125   KETONESUR NEGATIVE 03/15/2018 1125   PROTEINUR NEGATIVE 03/15/2018 1125   NITRITE NEGATIVE 03/15/2018 1125   LEUKOCYTESUR NEGATIVE 03/15/2018 1125   Sepsis Labs Invalid input(s): PROCALCITONIN,  WBC,  LACTICIDVEN Microbiology Recent Results (from the past 240 hour(s))  Blood Culture (routine x 2)     Status: None (Preliminary result)   Collection  Time: 03/15/18 11:25 AM  Result Value Ref Range Status   Specimen Description   Final    BLOOD RIGHT ARM Performed at [facility], [address], [place], [place] [address]    Special Requests   Final    BOTTLES DRAWN AEROBIC AND ANAEROBIC Blood Culture results may not be optimal due to an excessive volume of blood received in culture bottles Performed at [facility], [address] [place], [place] [address]    Culture   Final    NO GROWTH 2 DAYS Performed at [facility], [address], [place], [place] [address]    Report Status PENDING  Incomplete  Urine culture     Status: Abnormal   Collection Time: 03/15/18 11:25 AM  Result Value Ref Range Status   Specimen Description   Final    URINE, RANDOM Performed at [facility], [address], [place], [place] [address]    Special Requests   Final    NONE Performed at [facility], [address], [place], [place] [address]    Culture MULTIPLE SPECIES PRESENT, SUGGEST RECOLLECTION (A)  Final   Report Status 03/16/2018 FINAL  Final  Blood Culture (routine x 2)     Status: None (Preliminary result)   Collection Time: 03/15/18 11:26 AM  Result Value Ref Range Status   Specimen Description   Final    BLOOD RIGHT ARM Performed at [facility], [address] [place], [place] [address]    Special Requests   Final    BOTTLES DRAWN AEROBIC AND ANAEROBIC Blood Culture results may not be optimal due to an excessive volume of blood received in culture bottles   Culture   Final    NO GROWTH 2 DAYS Performed at [facility], [address], [place],  [place] [address]    Report Status PENDING  Incomplete    Please note: You were cared for by a hospitalist during your hospital stay. Once you are discharged, your primary care physician will handle any further medical issues. Please note that NO REFILLS for any discharge  medications will be authorized once you are discharged, as it is imperative that you return to your primary care physician (or establish a relationship with a primary care physician if you do not have one) for your post hospital discharge needs so that they can reassess your need for medications and monitor your lab values.    Time coordinating discharge: 40 minutes  SIGNED:   Burnadette Pop, MD  [facility] 03/17/2018, 11:27 AM Pager [phone]  If 7PM-7AM, please contact night-coverage [email or web address] Password [email or web address]

## 2018-03-17 NOTE — Progress Notes (Signed)
Pt is ready to be D/C today. Received call from RN stating that pt is wheelchair bound. He was admitted from the doctor's office and he left his wheelchair at the doctor's office.   Met with pt. He lives alone. He is active with [facility] for RN, PT and an aide. The aide comes in 1-2 days / week.   He asked if he could stay another day until he picks up his wheelchair tomorrow. He reports that his daughter is in [place] and she is not able to pick him up today.  Informed pt that his insurance may not pay for him to stay another day since he is medically stable to be D/C, but is not safe for him to return home without his wheelchair. Informed him that the doctor's office is closed tomorrow and they won't open until Monday morning.  Met with RN and discussed the D/C plan. Informed her that it won't be safe to D/C pt today without his wheelchair. She provided pt's daughter Raquel Sarna) phone # [phone].Marland Kitchen  Contacted Emily and left a VM to contact CM.  Will continue to f/u to assist with the D/C plan.

## 2018-03-17 NOTE — Progress Notes (Signed)
Ambulance transportation arranged.

## 2018-03-17 NOTE — Progress Notes (Signed)
Received call from pt's daughter [name]). Informed [name] that pt is ready to be D/C today. Discussed wheelchair issue. She reports that pt has a manual wheelchair at home and he left the powerchair at the doctor's office. She reports that pt lives alone and he has been able to manage by himself and he also receives [facility] services. She stated that she is in [place] and she is not able to assist pt today. Daughter asked about the D/C instructions and meds. Encouraged daughter to call her father to discuss the D/C plan and him using his manual W/C until he gets his powerchair. Also encouraged daughter to discuss the D/C instructions with the nurse. Daughter agreed to contact her father and the nurse. Will continue to f/u to assist wit the D/C plan.

## 2018-03-20 ENCOUNTER — Telehealth: Payer: Self-pay | Admitting: Vascular Surgery

## 2018-03-20 LAB — CULTURE, BLOOD (ROUTINE X 2)
CULTURE: NO GROWTH
Culture: NO GROWTH

## 2018-03-20 NOTE — Telephone Encounter (Signed)
-----   Message from Sharee Pimple, RN sent at 03/19/2018 11:47 AM EDT ----- Regarding: may already have been done   ----- Message ----- From: Cephus Shelling, MD Sent: 03/17/2018  10:58 AM EDT To: Sharee Pimple, RN, [facility]  I was called about Mr. Joel Solis this morning.  Apparently has a healing small ulcer but abnormal ABI's.  Will be discharged home Sat from [facility]  Can we arrange first available to evaluate for PAD?  Thanks,  Thayer Ohm

## 2018-03-20 NOTE — Telephone Encounter (Signed)
sch appt spk to pt mld ltr 05/08/18 9am New pt MD

## 2018-04-13 ENCOUNTER — Other Ambulatory Visit: Payer: Self-pay

## 2018-04-13 ENCOUNTER — Encounter (HOSPITAL_COMMUNITY): Payer: Self-pay | Admitting: *Deleted

## 2018-04-13 ENCOUNTER — Inpatient Hospital Stay (HOSPITAL_COMMUNITY)
Admission: EM | Admit: 2018-04-13 | Discharge: 2018-04-16 | DRG: 603 | Disposition: A | Discharge status: Home Health | Payer: Medicare Other | Attending: Internal Medicine | Admitting: Internal Medicine

## 2018-04-13 ENCOUNTER — Emergency Department (HOSPITAL_COMMUNITY): Payer: Medicare Other

## 2018-04-13 DIAGNOSIS — I739 Peripheral vascular disease, unspecified: Secondary | ICD-10-CM | POA: Diagnosis present

## 2018-04-13 DIAGNOSIS — Z6831 Body mass index (BMI) 31.0-31.9, adult: Secondary | ICD-10-CM | POA: Diagnosis not present

## 2018-04-13 DIAGNOSIS — Z993 Dependence on wheelchair: Secondary | ICD-10-CM | POA: Diagnosis not present

## 2018-04-13 DIAGNOSIS — L03116 Cellulitis of left lower limb: Secondary | ICD-10-CM | POA: Diagnosis present

## 2018-04-13 DIAGNOSIS — Z91013 Allergy to seafood: Secondary | ICD-10-CM | POA: Diagnosis not present

## 2018-04-13 DIAGNOSIS — Z79899 Other long term (current) drug therapy: Secondary | ICD-10-CM | POA: Diagnosis not present

## 2018-04-13 DIAGNOSIS — E669 Obesity, unspecified: Secondary | ICD-10-CM | POA: Diagnosis present

## 2018-04-13 DIAGNOSIS — Z982 Presence of cerebrospinal fluid drainage device: Secondary | ICD-10-CM

## 2018-04-13 DIAGNOSIS — Z8673 Personal history of transient ischemic attack (TIA), and cerebral infarction without residual deficits: Secondary | ICD-10-CM | POA: Diagnosis not present

## 2018-04-13 DIAGNOSIS — L539 Erythematous condition, unspecified: Secondary | ICD-10-CM

## 2018-04-13 DIAGNOSIS — I878 Other specified disorders of veins: Secondary | ICD-10-CM | POA: Diagnosis present

## 2018-04-13 DIAGNOSIS — R609 Edema, unspecified: Secondary | ICD-10-CM

## 2018-04-13 DIAGNOSIS — L0390 Cellulitis, unspecified: Secondary | ICD-10-CM

## 2018-04-13 DIAGNOSIS — I69354 Hemiplegia and hemiparesis following cerebral infarction affecting left non-dominant side: Secondary | ICD-10-CM | POA: Diagnosis not present

## 2018-04-13 DIAGNOSIS — F17200 Nicotine dependence, unspecified, uncomplicated: Secondary | ICD-10-CM | POA: Diagnosis present

## 2018-04-13 DIAGNOSIS — I6930 Unspecified sequelae of cerebral infarction: Secondary | ICD-10-CM | POA: Diagnosis not present

## 2018-04-13 DIAGNOSIS — L03119 Cellulitis of unspecified part of limb: Secondary | ICD-10-CM | POA: Diagnosis present

## 2018-04-13 DIAGNOSIS — I10 Essential (primary) hypertension: Secondary | ICD-10-CM | POA: Diagnosis present

## 2018-04-13 DIAGNOSIS — I872 Venous insufficiency (chronic) (peripheral): Secondary | ICD-10-CM | POA: Diagnosis not present

## 2018-04-13 DIAGNOSIS — E785 Hyperlipidemia, unspecified: Secondary | ICD-10-CM | POA: Diagnosis present

## 2018-04-13 DIAGNOSIS — E876 Hypokalemia: Secondary | ICD-10-CM | POA: Diagnosis present

## 2018-04-13 DIAGNOSIS — N179 Acute kidney failure, unspecified: Secondary | ICD-10-CM | POA: Diagnosis present

## 2018-04-13 LAB — CBC WITH DIFFERENTIAL/PLATELET
Abs Immature Granulocytes: 0.04 10*3/uL (ref 0.00–0.07)
Basophils Absolute: 0.1 10*3/uL (ref 0.0–0.1)
Basophils Relative: 1 %
EOS ABS: 0.3 10*3/uL (ref 0.0–0.5)
EOS PCT: 3 %
HEMATOCRIT: 47.2 % (ref 39.0–52.0)
Hemoglobin: 14.6 g/dL (ref 13.0–17.0)
Immature Granulocytes: 0 %
LYMPHS ABS: 2.9 10*3/uL (ref 0.7–4.0)
LYMPHS PCT: 27 %
MCH: 27.4 pg (ref 26.0–34.0)
MCHC: 30.9 g/dL (ref 30.0–36.0)
MCV: 88.7 fL (ref 80.0–100.0)
MONOS PCT: 10 %
Monocytes Absolute: 1 10*3/uL (ref 0.1–1.0)
NEUTROS PCT: 59 %
Neutro Abs: 6.2 10*3/uL (ref 1.7–7.7)
Platelets: 254 10*3/uL (ref 150–400)
RBC: 5.32 MIL/uL (ref 4.22–5.81)
RDW: 13.5 % (ref 11.5–15.5)
WBC: 10.6 10*3/uL — ABNORMAL HIGH (ref 4.0–10.5)
nRBC: 0 % (ref 0.0–0.2)

## 2018-04-13 LAB — BASIC METABOLIC PANEL
Anion gap: 8 (ref 5–15)
BUN: 20 mg/dL (ref 8–23)
CALCIUM: 8.2 mg/dL — AB (ref 8.9–10.3)
CO2: 26 mmol/L (ref 22–32)
CREATININE: 1.29 mg/dL — AB (ref 0.61–1.24)
Chloride: 107 mmol/L (ref 98–111)
GFR calc Af Amer: >60 mL/min (ref >60)
GFR calc non Af Amer: 53 mL/min — ABNORMAL LOW (ref >60)
GLUCOSE: 94 mg/dL (ref 70–99)
Potassium: 3.4 mmol/L — ABNORMAL LOW (ref 3.5–5.1)
Sodium: 141 mmol/L (ref 135–145)

## 2018-04-13 MED ORDER — ACETAMINOPHEN 650 MG RE SUPP: 650.0000 mg | Freq: Four times a day (QID) | RECTAL | Status: DC | PRN

## 2018-04-13 MED ORDER — SODIUM CHLORIDE 0.9 % IV SOLN
INTRAVENOUS | Status: DC | PRN
  Administered 2018-04-13: 1000 mL via INTRAVENOUS

## 2018-04-13 MED ORDER — VANCOMYCIN HCL 10 G IV SOLR
1750.0000 mg | Freq: Once | INTRAVENOUS | Status: AC
  Administered 2018-04-13: 1750 mg via INTRAVENOUS
  Filled 2018-04-13 (×2): qty 1750

## 2018-04-13 MED ORDER — PIPERACILLIN-TAZOBACTAM 3.375 G IVPB
3.3750 g | Freq: Three times a day (TID) | INTRAVENOUS | Status: DC
  Administered 2018-04-13 – 2018-04-15 (×5): 3.375 g via INTRAVENOUS
  Filled 2018-04-13 (×6): qty 50

## 2018-04-13 MED ORDER — BISACODYL 5 MG PO TBEC: 5.0000 mg | DELAYED_RELEASE_TABLET | Freq: Every day | ORAL | Status: DC | PRN

## 2018-04-13 MED ORDER — SODIUM CHLORIDE 0.9 % IV SOLN
1.0000 g | Freq: Once | INTRAVENOUS | Status: DC
  Administered 2018-04-13: 1 g via INTRAVENOUS
  Filled 2018-04-13: qty 1

## 2018-04-13 MED ORDER — SENNOSIDES-DOCUSATE SODIUM 8.6-50 MG PO TABS: 1.0000 | ORAL_TABLET | Freq: Every evening | ORAL | Status: DC | PRN

## 2018-04-13 MED ORDER — ONDANSETRON HCL 4 MG/2ML IJ SOLN: 4.0000 mg | Freq: Four times a day (QID) | INTRAMUSCULAR | Status: DC | PRN

## 2018-04-13 MED ORDER — VANCOMYCIN HCL IN DEXTROSE 1-5 GM/200ML-% IV SOLN: 1000.0000 mg | Freq: Once | INTRAVENOUS | Status: DC

## 2018-04-13 MED ORDER — ONDANSETRON HCL 4 MG PO TABS: 4.0000 mg | ORAL_TABLET | Freq: Four times a day (QID) | ORAL | Status: DC | PRN

## 2018-04-13 MED ORDER — PIPERACILLIN-TAZOBACTAM 3.375 G IVPB 30 MIN: 3.3750 g | Freq: Once | INTRAVENOUS | Status: DC

## 2018-04-13 MED ORDER — ACETAMINOPHEN 325 MG PO TABS
650.0000 mg | ORAL_TABLET | Freq: Four times a day (QID) | ORAL | Status: DC | PRN
  Administered 2018-04-14: 650 mg via ORAL
  Filled 2018-04-13: qty 2

## 2018-04-13 MED ORDER — SODIUM CHLORIDE 0.9 % IV SOLN
INTRAVENOUS | Status: DC
  Administered 2018-04-13: 17:00:00 via INTRAVENOUS

## 2018-04-13 MED ORDER — HYDROCODONE-ACETAMINOPHEN 5-325 MG PO TABS
1.0000 | ORAL_TABLET | ORAL | Status: DC | PRN
  Administered 2018-04-14: 1 via ORAL
  Filled 2018-04-13: qty 1

## 2018-04-13 MED ORDER — VANCOMYCIN HCL IN DEXTROSE 1-5 GM/200ML-% IV SOLN
1000.0000 mg | Freq: Once | INTRAVENOUS | Status: DC
  Filled 2018-04-13: qty 200

## 2018-04-13 MED ORDER — VANCOMYCIN HCL 10 G IV SOLR
1250.0000 mg | INTRAVENOUS | Status: DC
  Administered 2018-04-14: 1250 mg via INTRAVENOUS
  Filled 2018-04-13: qty 1250

## 2018-04-13 MED ORDER — ENOXAPARIN SODIUM 60 MG/0.6ML ~~LOC~~ SOLN
50.0000 mg | SUBCUTANEOUS | Status: DC
  Administered 2018-04-13 – 2018-04-14 (×2): 50 mg via SUBCUTANEOUS
  Filled 2018-04-13 (×3): qty 0.6

## 2018-04-13 NOTE — ED Provider Notes (Signed)
[facility]-EMERGENCY DEPT Provider Note   CSN: [id number] Arrival date & time: 04/13/18  1231     History   Chief Complaint Chief Complaint  Patient presents with  . Leg Swelling    HPI Kysen Wetherington is a 72 y.o. male.  72 year old male with prior medical history as detailed below presents for evaluation of pain and erythema of the left lower extremity.  Patient reports prior history of left leg cellulitis.  Patient reports that he just finished taking a course of antibiotics for treatment of same.  Over the last 2 to 3 days he is noted increased redness and swelling of the left leg.  He denies fever at home.  His home health care nurse noticed the worsening condition of his left leg and he was instructed to come to the ED for evaluation.  Of note, patient with recent admission for same problem last month.  His infection resolved with vancomycin and cefepime.  The history is provided by the patient and medical records.  Illness  This is a recurrent problem. The current episode started 2 days ago. The problem occurs constantly. The problem has not changed since onset.Pertinent negatives include no chest pain, no abdominal pain, no headaches and no shortness of breath. Nothing aggravates the symptoms. Nothing relieves the symptoms. He has tried nothing for the symptoms.    Past Medical History:  Diagnosis Date  . Essential hypertension   . Hemorrhagic stroke (HCC)   . HLD (hyperlipidemia)   . S/P VP shunt   . Tobacco abuse     Patient Active Problem List   Diagnosis Date Noted  . Lower extremity cellulitis 02/02/2018  . Infected superficial injury of toe of right foot 02/02/2018  . Cellulitis 02/02/2018  . AKI (acute kidney injury) (HCC) 11/22/2016  . Inneffective medication administration routine at home 11/22/2016  . Dirty living conditions 11/22/2016  . Elevated troponin 11/16/2016  . Acute metabolic encephalopathy 11/15/2016  . Hypokalemia  11/15/2016  . Essential hypertension   . HLD (hyperlipidemia)   . 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 S/P VP shunt   . Tobacco abuse     Past Surgical History:  Procedure Laterality Date  . cyst      cyst in neck  . VENTRICULOPERITONEAL SHUNT          Home Medications    Prior to Admission medications   Medication Sig Start Date End Date Taking? Authorizing Provider  amLODipine (NORVASC) 10 MG tablet Take 10 mg by mouth daily.    [provider]  atorvastatin (LIPITOR) 40 MG tablet Take 40 mg by mouth daily.    [provider]  baclofen (LIORESAL) 10 MG tablet Take 10 mg by mouth 3 (three) times daily.    [provider]  ibuprofen (ADVIL,MOTRIN) 200 MG tablet Take 400 mg by mouth every 6 (six) hours as needed for moderate pain.    [provider]  lisinopril (PRINIVIL,ZESTRIL) 20 MG tablet Take 20 mg by mouth daily.    [provider]  metoprolol tartrate (LOPRESSOR) 100 MG tablet Take 100 mg by mouth 2 (two) times daily.    [provider]  potassium chloride (MICRO-K) 10 MEQ CR capsule Take 2 pills daily for 5 days then continue taking 1 pill a day 03/18/18   Burnadette Pop, MD  sulfamethoxazole-trimethoprim (BACTRIM DS,SEPTRA DS) 800-160 MG tablet Take 1 tablet by mouth 2 (two) times daily. 03/17/18   Burnadette Pop,  MD    Family History Family History  Problem Relation Age of Onset  . Intracerebral hemorrhage Mother   . Lung cancer Father   . Esophageal cancer Brother     Social History Social History   Tobacco Use  . Smoking status: Current Every Day Smoker  . Smokeless tobacco: Never Used  Substance Use Topics  . Alcohol use: Yes    Comment: not drank in 10 months  . Drug use: No     Allergies   Shellfish allergy   Review of Systems Review of Systems  Respiratory: Negative for shortness of breath.   Cardiovascular: Negative for chest pain.    Gastrointestinal: Negative for abdominal pain.  Neurological: Negative for headaches.  All other systems reviewed and are negative.    Physical Exam Updated Vital Signs BP 132/71 (BP Location: Left Arm)   Pulse 64   Temp 97.7 F (36.5 C) (Oral)   Resp 18   SpO2 100%   Physical Exam  Constitutional: He is oriented to person, place, and time. He appears well-developed and well-nourished. No distress.  HENT:  Head: Normocephalic and atraumatic.  Mouth/Throat: Oropharynx is clear and moist.  Eyes: Pupils are equal, round, and reactive to light. Conjunctivae and EOM are normal.  Neck: Normal range of motion. Neck supple.  Cardiovascular: Normal rate, regular rhythm and normal heart sounds.  Pulmonary/Chest: Effort normal and breath sounds normal. No respiratory distress.  Abdominal: Soft. He exhibits no distension. There is no tenderness.  Musculoskeletal: Normal range of motion. He exhibits no edema or deformity.  Neurological: He is alert and oriented to person, place, and time.  Skin: Skin is warm and dry.  Erythema and edema noted to distal LLE consistent with cellulitis   Psychiatric: He has a normal mood and affect.  Nursing note and vitals reviewed.    ED Treatments / Results  Labs (all labs ordered are listed, but only abnormal results are displayed) Labs Reviewed  CBC WITH DIFFERENTIAL/PLATELET - Abnormal; Notable for the following components:      Result Value   WBC 10.6 (*)    All other components within normal limits  BASIC METABOLIC PANEL - Abnormal; Notable for the following components:   Potassium 3.4 (*)    Creatinine, Ser 1.29 (*)    Calcium 8.2 (*)    GFR calc non Af Amer 53 (*)    All other components within normal limits  CULTURE, BLOOD (ROUTINE X 2)  CULTURE, BLOOD (ROUTINE X 2)    EKG None  Radiology No results found.  Procedures Procedures (including critical care time)  Medications Ordered in ED Medications  ceFEPIme (MAXIPIME) 1 g  in sodium chloride 0.9 % 100 mL IVPB (has no administration in time range)     Initial Impression / Assessment and Plan / ED Course  I have reviewed the triage vital signs and the nursing notes.  Pertinent labs & imaging results that were available during my care of the patient were reviewed by me and considered in my medical decision making (see chart for details).     MDM  Screen complete  Patient is presenting for evaluation of recurrent cellulitis of LLE. Initial screening labs are without significant acute abnormality.  However, patient likely will benefit from inpatient treatment with IV antibiotics.  Hospitalist service is aware of case and will evaluate for admission.   Final Clinical Impressions(s) / ED Diagnoses   Final diagnoses:  Cellulitis, unspecified cellulitis site    ED Discharge Orders  None       Wynetta FinesMessick, Peter C, MD 04/13/18 [phone]

## 2018-04-13 NOTE — Progress Notes (Signed)
Pharmacy Antibiotic Note  Joel PearsonDaniel Solis is a 72 y.o. male admitted on 04/13/2018 with cellulitis. Admitted last month for the same. Patient received broad-spectrum antibiotics and was discharged on Septra. Pharmacy has been consulted for vancomycin and Zosyn dosing.   Plan: Zosyn 3.375 g EI q 8 hours.   Vancomycin 1750 mg iv once followed by 1250 mg iv q 24 hours. AUC 473, IBW/ABW.  F/U cultures and renal function Levels as indicated  Height: 5\' 9"  (175.3 cm) Weight: 244 lb 14.9 oz (111.1 kg) IBW/kg (Calculated) : 70.7  Temp (24hrs), Avg:97.7 F (36.5 C), Min:97.7 F (36.5 C), Max:97.7 F (36.5 C)  Recent Labs  Lab 04/13/18 1327  WBC 10.6*  CREATININE 1.29*    Estimated Creatinine Clearance: 62.7 mL/min (A) (by C-G formula based on SCr of 1.29 mg/dL (H)).    Allergies  Allergen Reactions  . Shellfish Allergy Nausea And Vomiting    Antimicrobials this admission: 11/22 vancomycin >>  11/22 Zosyn >>   Dose adjustments this admission:  Microbiology results: 11/22 BCx:   Thank you for allowing pharmacy to be a part of this patient's care.  Valentina GuChristy, Davonda Ausley D 04/13/2018 3:38 PM

## 2018-04-13 NOTE — Progress Notes (Signed)
A consult was received from an ED physician for vancomycin per pharmacy dosing.  The patient's profile has been reviewed for ht/wt/allergies/indication/available labs.   A one time order has been placed for vancomycin 1000 mg.  Further antibiotics/pharmacy consults should be ordered by admitting physician if indicated.                       Thank you, Valentina GuChristy, Sederick Jacobsen D 04/13/2018  2:53 PM

## 2018-04-13 NOTE — ED Notes (Signed)
Blood cultures collected prior to antibiotic administration. 

## 2018-04-13 NOTE — Progress Notes (Signed)
LLE venous duplex prelim: negative for DVT. Adylin Hankey Eunice, RDMS, RVT  

## 2018-04-13 NOTE — ED Triage Notes (Signed)
Pt BIB EMS from home complaints of LLE swelling for about a month.  Hx of stroke, cellulitis, HTN, hyperlipidemia.    VS- 124/63, HR 68, R-20, SpO2-99% on RA.

## 2018-04-13 NOTE — ED Notes (Signed)
ED TO INPATIENT HANDOFF REPORT  Name/Age/Gender Joel Solis 72 y.o. male  Code Status    Code Status Orders  (From admission, onward)         Start     Ordered   04/13/18 1527  Full code  Continuous     04/13/18 1527        Code Status History    Date Active Date Inactive Code Status Order ID Comments User Context   03/15/2018 1622 03/17/2018 2033 Full Code [id number]  Georgette Shell, MD ED   02/02/2018 1512 02/08/2018 1813 Full Code [id number]  Nita Sells, MD ED   11/22/2016 1918 11/24/2016 2247 Full Code [id number]  Vianne Bulls, MD ED   11/15/2016 2029 11/17/2016 1700 Full Code [id number]  Ivor Costa, MD ED    Advance Directive Documentation     Most Recent Value  Type of Advance Directive  Healthcare Power of Attorney  Pre-existing out of facility DNR order (yellow form or pink MOST form)  -  "MOST" Form in Place?  -      Home/SNF/Other Home  Chief Complaint lower left leg edema  Level of Care/Admitting Diagnosis ED Disposition    ED Disposition Condition [place]: [facility] [100102]  Level of Care: Med-Surg [16]  Diagnosis: Cellulitis of left leg [106269]  Admitting Physician: Gerlean Ren [facility] [[id number]]  Attending Physician: Gerlean Ren [facility] [[id number]]  Estimated length of stay: past midnight tomorrow  Certification:: I certify this patient will need inpatient services for at least 2 midnights  PT Class (Do Not Modify): Inpatient [101]  PT Acc Code (Do Not Modify): Private [1]       Medical History Past Medical History:  Diagnosis Date  . Essential hypertension   . Hemorrhagic stroke ([place])   . HLD (hyperlipidemia)   . S/P VP shunt   . Tobacco abuse     Allergies Allergies  Allergen Reactions  . Shellfish Allergy Nausea And Vomiting    IV Location/Drains/Wounds Patient Lines/Drains/Airways Status   Active Line/Drains/Airways    Name:   Placement date:   Placement time:   Site:    Days:   Peripheral IV 04/13/18 Right Antecubital   04/13/18    1517    Antecubital   less than 1   Wound / Incision (Open or Dehisced) 02/02/18 Leg Left;Lower large wound, inner left leg   02/02/18    1800    Leg   70   Wound / Incision (Open or Dehisced) 02/02/18 Non-pressure wound;Other (Comment) Leg Left;Lower;Other (Comment) small, circular, scabbed wound   02/02/18    1930    Leg   70   Wound / Incision (Open or Dehisced) 02/02/18 Non-pressure wound;Other (Comment) Leg Left;Lower small, circular, scabbed wound, ( slightly above other wound on outer left leg)   02/02/18    1930    Leg   70          Labs/Imaging Results for orders placed or performed during the hospital encounter of 04/13/18 (from the past 48 hour(s))  CBC with Differential     Status: Abnormal   Collection Time: 04/13/18  1:27 PM  Result Value Ref Range   WBC 10.6 (H) 4.0 - 10.5 K/uL   RBC 5.32 4.22 - 5.81 MIL/uL   Hemoglobin 14.6 13.0 - 17.0 g/dL   HCT 47.2 39.0 - 52.0 %   MCV 88.7 80.0 - 100.0 fL   MCH 27.4 26.0 - 34.0 pg  MCHC 30.9 30.0 - 36.0 g/dL   RDW 13.5 11.5 - 15.5 %   Platelets 254 150 - 400 K/uL   nRBC 0.0 0.0 - 0.2 %   Neutrophils Relative % 59 %   Neutro Abs 6.2 1.7 - 7.7 K/uL   Lymphocytes Relative 27 %   Lymphs Abs 2.9 0.7 - 4.0 K/uL   Monocytes Relative 10 %   Monocytes Absolute 1.0 0.1 - 1.0 K/uL   Eosinophils Relative 3 %   Eosinophils Absolute 0.3 0.0 - 0.5 K/uL   Basophils Relative 1 %   Basophils Absolute 0.1 0.0 - 0.1 K/uL   Immature Granulocytes 0 %   Abs Immature Granulocytes 0.04 0.00 - 0.07 K/uL    Comment: Performed at [facility], [place] [address] [place], [place] [address]  Basic metabolic panel     Status: Abnormal   Collection Time: 04/13/18  1:27 PM  Result Value Ref Range   Sodium 141 135 - 145 mmol/L   Potassium 3.4 (L) 3.5 - 5.1 mmol/L   Chloride 107 98 - 111 mmol/L   CO2 26 22 - 32 mmol/L   Glucose, Bld 94 70 - 99 mg/dL   BUN 20 8 - 23 mg/dL    Creatinine, Ser 1.29 (H) 0.61 - 1.24 mg/dL   Calcium 8.2 (L) 8.9 - 10.3 mg/dL   GFR calc non Af Amer 53 (L) >60 mL/min   GFR calc Af Amer >60 >60 mL/min    Comment: (NOTE) The eGFR has been calculated using the CKD EPI equation. This calculation has not been validated in all clinical situations. eGFR's persistently <60 mL/min signify possible Chronic Kidney Disease.    Anion gap 8 5 - 15    Comment: Performed at [facility], [place] [address], [place], [place] [address]   No results found.  Pending Labs Unresulted Labs (From admission, onward)    Start     Ordered   04/20/18 0500  Creatinine, serum  (enoxaparin (LOVENOX)    CrCl >/= 30 ml/min)  Weekly,   R    Comments:  while on enoxaparin therapy    04/13/18 1527   04/14/18 0500  Comprehensive metabolic panel  Tomorrow morning,   R     04/13/18 1527   04/14/18 0500  CBC  Tomorrow morning,   R     04/13/18 1527   04/13/18 1527  CBC  (enoxaparin (LOVENOX)    CrCl >/= 30 ml/min)  Once,   R    Comments:  Baseline for enoxaparin therapy IF NOT ALREADY DRAWN.  Notify MD if PLT < 100 K.    04/13/18 1527   04/13/18 1527  Creatinine, serum  (enoxaparin (LOVENOX)    CrCl >/= 30 ml/min)  Once,   R    Comments:  Baseline for enoxaparin therapy IF NOT ALREADY DRAWN.    04/13/18 1527   04/13/18 1450  Culture, blood (routine x 2)  BLOOD CULTURE X 2,   STAT     04/13/18 1449          Vitals/Pain Today's Vitals   04/13/18 1255 04/13/18 1259 04/13/18 1456 04/13/18 1510  BP:  132/71  134/75  Pulse:  64  64  Resp:  18  16  Temp:  97.7 F (36.5 C)    TempSrc:  Oral    SpO2:  100%  97%  Weight:   111.1 kg   Height:   5' 9"  (1.753 m)   PainSc: 0-No pain  Isolation Precautions No active isolations  Medications Medications  0.9 %  sodium chloride infusion ( Intravenous Rate/Dose Verify 04/13/18 1709)  enoxaparin (LOVENOX) injection 40 mg (has no administration in time range)  0.9 %  sodium chloride  infusion ( Intravenous New Bag/Given 04/13/18 1710)  acetaminophen (TYLENOL) tablet 650 mg (has no administration in time range)    Or  acetaminophen (TYLENOL) suppository 650 mg (has no administration in time range)  HYDROcodone-acetaminophen (NORCO/VICODIN) 5-325 MG per tablet 1-2 tablet (has no administration in time range)  senna-docusate (Senokot-S) tablet 1 tablet (has no administration in time range)  bisacodyl (DULCOLAX) EC tablet 5 mg (has no administration in time range)  ondansetron (ZOFRAN) tablet 4 mg (has no administration in time range)    Or  ondansetron (ZOFRAN) injection 4 mg (has no administration in time range)  vancomycin (VANCOCIN) 1,750 mg in sodium chloride 0.9 % 500 mL IVPB (has no administration in time range)  vancomycin (VANCOCIN) 1,250 mg in sodium chloride 0.9 % 250 mL IVPB (has no administration in time range)  piperacillin-tazobactam (ZOSYN) IVPB 3.375 g (has no administration in time range)    Mobility walks

## 2018-04-13 NOTE — H&P (Signed)
History and Physical    Joel Solis ZOX:[id number] DOB: [birth year] DOA: 04/13/2018  PCP: [facility] Patient coming from: Sent by his primary care provider  Chief Complaint: Lower extremity erythema  HPI: Joel Solis is a 72 y.o. male with medical history significant of with history of essential hypertension, peripheral vascular disease, hyperlipidemia, status post VP shunt comes to the hospital for evaluation of left lower extremity swelling and erythema which has worsened.  Patient was admitted here about a month ago for left lower extremity cellulitis.  Initially was treated with IV vancomycin and cefepime due to history of pseudomonal infection and eventually transition to oral Bactrim.  He was arranged for home health at home but despite of completing his treatment his swelling and erythema worsened.  Was seen by PCP who sent him to the hospital for further care. Patient denies any fevers and chills at home.  He uses power chair at baseline and admits of accidental foot trauma times. During his previous hospitalization he was evaluated by peripheral vascular disease, on ABI he was found to have moderate disease and referred to outpatient vascular surgery.  He has a follow-up appointment coming up soon.  In the ER today due to persistence of the symptoms and relative leukocytosis he was started on IV vancomycin and cefepime.  Lower extremity Dopplers prelim read was negative for DVT.  Medical team was requested to admit the patient.   Review of Systems: As per HPI otherwise 10 point review of systems negative.  Review of Systems Otherwise negative except as per HPI, including: General: Denies fever, chills, night sweats or unintended weight loss. Resp: Denies cough, wheezing, shortness of breath. Cardiac: Denies chest pain, palpitations, orthopnea, paroxysmal nocturnal dyspnea. GI: Denies abdominal pain, nausea, vomiting, diarrhea or constipation GU:  Denies dysuria, frequency, hesitancy or incontinence MS: Denies muscle aches, joint pain or swelling Neuro: Denies headache, neurologic deficits (focal weakness, numbness, tingling), abnormal gait Psych: Denies anxiety, depression, SI/HI/AVH Skin: Lower extremity swelling and erythema ID: Denies sick contacts, exotic exposures, travel  Past Medical History:  Diagnosis Date  . Essential hypertension   . Hemorrhagic stroke ([facility])   . HLD (hyperlipidemia)   . S/P VP shunt   . Tobacco abuse     Past Surgical History:  Procedure Laterality Date  . cyst      cyst in neck  . VENTRICULOPERITONEAL SHUNT      SOCIAL HISTORY:  reports that he has been smoking. He has never used smokeless tobacco. He reports that he drinks alcohol. He reports that he does not use drugs.  Allergies  Allergen Reactions  . Shellfish Allergy Nausea And Vomiting    FAMILY HISTORY: Family History  Problem Relation Age of Onset  . Intracerebral hemorrhage Mother   . Lung cancer Father   . Esophageal cancer Brother      Prior to Admission medications   Medication Sig Start Date End Date Taking? Authorizing Provider  amLODipine (NORVASC) 10 MG tablet Take 10 mg by mouth daily.    [provider]  atorvastatin (LIPITOR) 40 MG tablet Take 40 mg by mouth daily.    [provider]  baclofen (LIORESAL) 10 MG tablet Take 10 mg by mouth 3 (three) times daily.    [provider]  fluconazole (DIFLUCAN) 100 MG tablet Take 100 mg by mouth daily. 14 day course 03/23/18   [provider]  furosemide (LASIX) 20 MG tablet Take 20 mg by mouth daily. 04/02/18   [provider]  ibuprofen (ADVIL,MOTRIN) 200 MG tablet Take 400 mg by mouth every 6 (six) hours as needed for moderate pain.    [provider]  lisinopril (PRINIVIL,ZESTRIL) 20 MG tablet Take 20 mg by mouth daily.    [provider]  metoprolol tartrate (LOPRESSOR) 100 MG tablet Take 100 mg by mouth 2  (two) times daily.    [provider]  potassium chloride (MICRO-K) 10 MEQ CR capsule Take 2 pills daily for 5 days then continue taking 1 pill a day 03/18/18   Burnadette Pop, MD  sulfamethoxazole-trimethoprim (BACTRIM DS,SEPTRA DS) 800-160 MG tablet Take 1 tablet by mouth 2 (two) times daily. 03/17/18   Burnadette Pop, MD    Physical Exam: Vitals:   04/13/18 1259 04/13/18 1456 04/13/18 1510  BP: 132/71  134/75  Pulse: 64  64  Resp: 18  16  Temp: 97.7 F (36.5 C)    TempSrc: Oral    SpO2: 100%  97%  Weight:  111.1 kg   Height:  5\' 9"  (1.753 m)       Constitutional: NAD, calm, comfortable Eyes: PERRL, lids and conjunctivae normal ENMT: Mucous membranes are moist. Posterior pharynx clear of any exudate or lesions.Normal dentition.  Neck: normal, supple, no masses, no thyromegaly Respiratory: clear to auscultation bilaterally, no wheezing, no crackles. Normal respiratory effort. No accessory muscle use.  Cardiovascular: Regular rate and rhythm, no murmurs / rubs / gallops. No extremity edema. 2+ pedal pulses. No carotid bruits.  Abdomen: no tenderness, no masses palpated. No hepatosplenomegaly. Bowel sounds positive.  Musculoskeletal: no clubbing / cyanosis. No joint deformity upper and lower extremities. Good ROM, no contractures. Normal muscle tone.  Skin: Left lower extremity erythema and swelling noted up to his knees.  No evidence of abscess.  No obvious opening noted.  Slightly poor hygiene of his toes.  Area as nontender but warm to touch. Neurologic: CN 2-12 grossly intact. Sensation intact, DTR normal. Strength 5/5 in all 4.  Psychiatric: Normal judgment and insight. Alert and oriented x 3. Normal mood.     Labs on Admission: I have personally reviewed following labs and imaging studies  CBC: Recent Labs  Lab 04/13/18 1327  WBC 10.6*  NEUTROABS 6.2  HGB 14.6  HCT 47.2  MCV 88.7  PLT 254   Basic Metabolic Panel: Recent Labs  Lab 04/13/18 1327  NA  141  K 3.4*  CL 107  CO2 26  GLUCOSE 94  BUN 20  CREATININE 1.29*  CALCIUM 8.2*   GFR: Estimated Creatinine Clearance: 62.7 mL/min (A) (by C-G formula based on SCr of 1.29 mg/dL (H)). Liver Function Tests: No results for input(s): AST, ALT, ALKPHOS, BILITOT, PROT, ALBUMIN in the last 168 hours. No results for input(s): LIPASE, AMYLASE in the last 168 hours. No results for input(s): AMMONIA in the last 168 hours. Coagulation Profile: No results for input(s): INR, PROTIME in the last 168 hours. Cardiac Enzymes: No results for input(s): CKTOTAL, CKMB, CKMBINDEX, TROPONINI in the last 168 hours. BNP (last 3 results) No results for input(s): PROBNP in the last 8760 hours. HbA1C: No results for input(s): HGBA1C in the last 72 hours. CBG: No results for input(s): GLUCAP in the last 168 hours. Lipid Profile: No results for input(s): CHOL, HDL, LDLCALC, TRIG, CHOLHDL, LDLDIRECT in the last 72 hours. Thyroid Function Tests: No results for input(s): TSH, T4TOTAL, FREET4, T3FREE, THYROIDAB in the last 72 hours. Anemia Panel: No results for input(s): VITAMINB12, FOLATE, FERRITIN, TIBC, IRON, RETICCTPCT in the  last 72 hours. Urine analysis:    Component Value Date/Time   COLORURINE YELLOW 03/15/2018 1125   APPEARANCEUR CLEAR 03/15/2018 1125   LABSPEC 1.020 03/15/2018 1125   PHURINE 5.0 03/15/2018 1125   GLUCOSEU NEGATIVE 03/15/2018 1125   HGBUR NEGATIVE 03/15/2018 1125   BILIRUBINUR NEGATIVE 03/15/2018 1125   KETONESUR NEGATIVE 03/15/2018 1125   PROTEINUR NEGATIVE 03/15/2018 1125   NITRITE NEGATIVE 03/15/2018 1125   LEUKOCYTESUR NEGATIVE 03/15/2018 1125   Sepsis Labs: !!!!!!!!!!!!!!!!!!!!!!!!!!!!!!!!!!!!!!!!!!!! @LABRCNTIP (procalcitonin:4,lacticidven:4) )No results found for this or any previous visit (from the past 240 hour(s)).   Radiological Exams on Admission: No results found.   All images have been reviewed by me personally.    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  Has failed outpatient treatment course of oral Bactrim. - Prelim lower extremity Dopplers is negative for DVT, follow-up final read -Blood cultures ordered, will start broad-spectrum antibiotics vancomycin and Zosyn.  Cefepime has been given in the ER - Wonder if this is secondary to vascular disease or some underlying dermatologic condition.  He has follow-up outpatient vascular appointment in about 3 weeks.  May also benefit from outpatient dermatology referral -Consider consulting infectious disease in the hospital.  Essential hypertension, controlled - We will resume home medications once confirmed by pharmacy.  Currently awaiting fax to update the medication list.  Peripheral vascular disease, Moderate -Recent ABIs showed moderate arterial disease in the lower extremity.  Has follow-up appointment outpatient with vascular in 3 weeks.  Patient is mostly wheelchair-bound does admit of frequent accidental trauma of his legs.  Hyperlipidemia -Continue statin  DVT prophylaxis: Lovenox  Code Status: Full  Family Communication: None  Disposition Plan:  TBD Consults called: None, may Need ID input.  Admission status: Med Surg inpatient, failed outpatient PO Abx treatment    Time Spent: 65 minutes.  >50% of the time was devoted to discussing the patients care, assessment, plan and disposition with other care givers along with counseling the patient about the risks and benefits of treatment.    Ankit Joline Maxcyhirag Amin MD [facility] Pager [phone]  If 7PM-7AM, please contact night-coverage [email or web address] Password [email or web address]  04/13/2018,  3:28 PM

## 2018-04-13 NOTE — Progress Notes (Signed)
Patient was transferred from ED at 1747. Alert and oriented x 4. No skin issue. Vital signs was taken. Left lef is edema and pain. Left side weakness.

## 2018-04-14 DIAGNOSIS — Z982 Presence of cerebrospinal fluid drainage device: Secondary | ICD-10-CM

## 2018-04-14 DIAGNOSIS — I6930 Unspecified sequelae of cerebral infarction: Secondary | ICD-10-CM

## 2018-04-14 DIAGNOSIS — I872 Venous insufficiency (chronic) (peripheral): Secondary | ICD-10-CM

## 2018-04-14 LAB — COMPREHENSIVE METABOLIC PANEL
ALT: 13 U/L (ref 0–44)
ANION GAP: 9 (ref 5–15)
AST: 14 U/L — ABNORMAL LOW (ref 15–41)
Albumin: 3 g/dL — ABNORMAL LOW (ref 3.5–5.0)
Alkaline Phosphatase: 130 U/L — ABNORMAL HIGH (ref 38–126)
BUN: 14 mg/dL (ref 8–23)
CALCIUM: 8 mg/dL — AB (ref 8.9–10.3)
CO2: 23 mmol/L (ref 22–32)
Chloride: 109 mmol/L (ref 98–111)
Creatinine, Ser: 1.09 mg/dL (ref 0.61–1.24)
Glucose, Bld: 85 mg/dL (ref 70–99)
Potassium: 3.2 mmol/L — ABNORMAL LOW (ref 3.5–5.1)
SODIUM: 141 mmol/L (ref 135–145)
TOTAL PROTEIN: 6.5 g/dL (ref 6.5–8.1)
Total Bilirubin: 0.4 mg/dL (ref 0.3–1.2)

## 2018-04-14 LAB — MAGNESIUM: MAGNESIUM: 2.1 mg/dL (ref 1.7–2.4)

## 2018-04-14 LAB — CBC
HCT: 49.1 % (ref 39.0–52.0)
Hemoglobin: 14.9 g/dL (ref 13.0–17.0)
MCH: 27.1 pg (ref 26.0–34.0)
MCHC: 30.3 g/dL (ref 30.0–36.0)
MCV: 89.4 fL (ref 80.0–100.0)
NRBC: 0 % (ref 0.0–0.2)
PLATELETS: 242 10*3/uL (ref 150–400)
RBC: 5.49 MIL/uL (ref 4.22–5.81)
RDW: 13.3 % (ref 11.5–15.5)
WBC: 9 10*3/uL (ref 4.0–10.5)

## 2018-04-14 LAB — GLUCOSE, CAPILLARY: GLUCOSE-CAPILLARY: 81 mg/dL (ref 70–99)

## 2018-04-14 LAB — PHOSPHORUS: Phosphorus: 3.2 mg/dL (ref 2.5–4.6)

## 2018-04-14 MED ORDER — AMLODIPINE BESYLATE 10 MG PO TABS
10.0000 mg | ORAL_TABLET | Freq: Every day | ORAL | Status: DC
  Administered 2018-04-14 – 2018-04-16 (×3): 10 mg via ORAL
  Filled 2018-04-14 (×3): qty 1

## 2018-04-14 MED ORDER — FUROSEMIDE 10 MG/ML IJ SOLN
40.0000 mg | Freq: Two times a day (BID) | INTRAMUSCULAR | Status: DC
  Administered 2018-04-14 – 2018-04-15 (×3): 40 mg via INTRAVENOUS
  Filled 2018-04-14 (×3): qty 4

## 2018-04-14 MED ORDER — ATORVASTATIN CALCIUM 40 MG PO TABS
40.0000 mg | ORAL_TABLET | Freq: Every day | ORAL | Status: DC
  Administered 2018-04-14 – 2018-04-16 (×3): 40 mg via ORAL
  Filled 2018-04-14 (×3): qty 1

## 2018-04-14 MED ORDER — METOPROLOL TARTRATE 50 MG PO TABS
100.0000 mg | ORAL_TABLET | Freq: Two times a day (BID) | ORAL | Status: DC
  Administered 2018-04-14 – 2018-04-16 (×5): 100 mg via ORAL
  Filled 2018-04-14 (×5): qty 2

## 2018-04-14 MED ORDER — FUROSEMIDE 10 MG/ML IJ SOLN: 40.0000 mg | Freq: Once | INTRAMUSCULAR | Status: DC

## 2018-04-14 MED ORDER — LISINOPRIL 20 MG PO TABS
20.0000 mg | ORAL_TABLET | Freq: Every day | ORAL | Status: DC
  Administered 2018-04-14 – 2018-04-15 (×2): 20 mg via ORAL
  Filled 2018-04-14 (×2): qty 1

## 2018-04-14 MED ORDER — POTASSIUM CHLORIDE CRYS ER 20 MEQ PO TBCR
40.0000 meq | EXTENDED_RELEASE_TABLET | Freq: Two times a day (BID) | ORAL | Status: AC
  Administered 2018-04-14 (×2): 40 meq via ORAL
  Filled 2018-04-14 (×2): qty 2

## 2018-04-14 NOTE — Progress Notes (Signed)
PROGRESS NOTE    Joel Solis  ZOX:[id number] DOB: 1945-12-31 DOA: 04/13/2018 PCP: [facility]  Brief Narrative:  HPI per Dr. Stephania Fragmin on 04/13/18 Joel Solis is a 72 y.o. male with medical history significant of with history of essential hypertension, peripheral vascular disease, hyperlipidemia, status post VP shunt comes to the hospital for evaluation of left lower extremity swelling and erythema which has worsened.  Patient was admitted here about a month ago for left lower extremity cellulitis.  Initially was treated with IV vancomycin and cefepime due to history of pseudomonal infection and eventually transition to oral Bactrim.  He was arranged for home health at home but despite of completing his treatment his swelling and erythema worsened.  Was seen by PCP who sent him to the hospital for further care. Patient denies any fevers and chills at home.  He uses power chair at baseline and admits of accidental foot trauma times. During his previous hospitalization he was evaluated by peripheral vascular disease, on ABI he was found to have moderate disease and referred to outpatient vascular surgery.  He has a follow-up appointment coming up soon.  In the ER today due to persistence of the symptoms and relative leukocytosis he was started on IV vancomycin and cefepime.  Lower extremity Dopplers prelim read was negative for DVT.  Medical team was requested to admit the patient.  **Leg was not warm today but still erythematous. Patient is annoyed as this is his 3rd time in the hospital.   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  Has failed outpatient treatment course of oral Bactrim. -Prelim lower extremity Dopplers is negative for DVT, follow-up final read -Had a Leukocytosis on Admission of 10.6 -Blood cultures ordered and pending -Was started on broad-spectrum antibiotics with vancomycin and Zosyn by admitting Physician and will continue.  Cefepime has been given in the ER -Not 100% this is cellulitis and wonder if this is secondary to vascular disease or some underlying dermatologic condition.  He has follow-up outpatient vascular appointment in about 3 weeks.  May also benefit from outpatient dermatology referral as an outpatient  -LE not warm, has no Leukocytosis and is afebrile today so not 100% convinced this is a Cellulitis  -Elevate Legs above the Level of the Heart  -Compression Stockings -Check X-Ray of the Leg  -Try Diuresing with IV Lasix 40 mg BID x2 Doses; Will hold Home Furosemide  -Stop IVF  -Will C/w IV Abx as above and de-escalate in AM likely to Doxycycline  -Check Leg X-Ray initially but may need advanced imaging with a CT Scan or MRI of the Leg   Essential Hypertension, controlled -C/w Amlodipine 10 mg p.o. daily, Lisinopril 20 mg p.o. daily and Metoprolol Tartrate 100 mg p.o. twice daily  Peripheral vascular disease, Moderate -Recent ABIs showed moderate arterial disease in the lower extremity.   -Has follow-up appointment outpatient with vascular in 3 weeks.   -C/w Home Atrovastatin 40 mg po qHS -Patient is mostly wheelchair-bound does admit of frequent accidental trauma of his legs.  Hyperlipidemia -C/w Atorvastatin 40 mg po qHS  Hypokalemia -Patient's K+ this AM  was 3.2 -Replete with po 40 mEQ BID x2 Doses -Continue to Monitor and Replete as Necessary -Repeat CMP in AM   Hx of Hemorrhagic CVA s/p VP Shunt with Residual Left Sided Weakness -C/w Atorvastatin 40 mg po Daily -PT/OT to Evaluate and  Treat  Obesity -Estimated body mass index is 31.13 kg/m as calculated from the following:   Height as of this encounter: 5\' 9"  (1.753 m).   Weight as of this encounter: 95.6 kg. -Weight Loss Counseling given   AKI, mild and improved -BUN/Cr on Admission was 20/1.29 and improved to 14/1.09; -Base line Cr ranging from 0.8-1.0 -Resumed Home Lisinopril and stopped IVF -Given Diuresis with IV Lasix 40 mg BID x2 doses -Avoid Nephrotoxic Medications/Agents/Contrast if possible -Continue to Monitor Renal Function Trend and repeat CMP in AM   DVT prophylaxis: Enoxaparin 50 mg sq q24h Code Status: FULL CODE Family Communication: No family present at bedside Disposition Plan: Anticipate D/C back to home environment in the next 24-48 hours if improved pending PT Evaluation   Consultants:   None   Procedures:  LE VENOUS DUPLEX LLE venous duplex prelim: negative for DVT.  Antimicrobials:  Anti-infectives (From admission, onward)   Start     Dose/Rate Route Frequency Ordered Stop   04/14/18 1730  vancomycin (VANCOCIN) 1,250 mg in sodium chloride 0.9 % 250 mL IVPB     1,250 mg 166.7 mL/hr over 90 Minutes Intravenous Every 24 hours 04/13/18 1537     04/13/18 2200  piperacillin-tazobactam (ZOSYN) IVPB 3.375 g     3.375 g 12.5 mL/hr over 240 Minutes Intravenous Every 8 hours 04/13/18 1537     04/13/18 1700  vancomycin (VANCOCIN) 1,750 mg in sodium chloride 0.9 % 500 mL IVPB     1,750 mg 250 mL/hr over 120 Minutes Intravenous  Once 04/13/18 1537 04/13/18 1928   04/13/18 1530  piperacillin-tazobactam (ZOSYN) IVPB 3.375 g  Status:  Discontinued     3.375 g 100 mL/hr over 30 Minutes Intravenous  Once 04/13/18 1529 04/13/18 1537   04/13/18 1530  vancomycin (VANCOCIN) IVPB 1000 mg/200 mL premix  Status:  Discontinued     1,000 mg 200 mL/hr over 60 Minutes Intravenous  Once 04/13/18 1529 04/13/18 1537   04/13/18 1500  ceFEPIme (MAXIPIME) 1 g in sodium chloride 0.9 % 100 mL IVPB  Status:   Discontinued     1 g 200 mL/hr over 30 Minutes Intravenous  Once 04/13/18 1450 04/13/18 1702   04/13/18 1500  vancomycin (VANCOCIN) IVPB 1000 mg/200 mL premix  Status:  Discontinued     1,000 mg 200 mL/hr over 60 Minutes Intravenous  Once 04/13/18 1453 04/13/18 1537     Subjective: Seen and examined at bedside and states that his leg was not hurting today and was not hot.  No chest pain, lightheadedness or dizziness.  No nausea or vomiting.  Objective: Vitals:   04/13/18 1747 04/13/18 2219 04/14/18 0447 04/14/18 0825  BP: 115/72 135/74 140/77 128/80  Pulse: 71 64 60 64  Resp: 18 16 16 17   Temp: 98.2 F (36.8 C) 97.7 F (36.5 C) 97.9 F (36.6 C) 98 F (36.7 C)  TempSrc: Oral Oral Oral Oral  SpO2: 100% 97% 100% 99%  Weight:   95.6 kg   Height:        Intake/Output Summary (Last 24 hours) at 04/14/2018 1300 Last data filed at 04/14/2018 1200 Gross per 24 hour  Intake 253.26 ml  Output 2875 ml  Net -2621.74 ml   Filed  Weights   04/13/18 1456 04/14/18 0447  Weight: 111.1 kg 95.6 kg   Examination: Physical Exam:  Constitutional: WN/WD obese Caucasian male NAD and appears agitated  Eyes: Lids and conjunctivae normal, sclerae anicteric  ENMT: External Ears, Nose appear normal. Grossly normal hearing. Neck: Appears normal, supple, no cervical masses, normal ROM, no appreciable thyromegaly, no JVD Respiratory: Diminished to auscultation bilaterally, no wheezing, rales, rhonchi or crackles. Normal respiratory effort and patient is not tachypenic. No accessory muscle use.  Cardiovascular: RRR, no murmurs / rubs / gallops. S1 and S2 auscultated. 1+ LE extremity edema bilaterally worse on the Left compared to the Right Abdomen: Soft, non-tender, Distended. No masses palpated. No appreciable hepatosplenomegaly. Bowel sounds positive x4.  GU: Deferred. Musculoskeletal: No clubbing / cyanosis of digits/nails. Skin: No rashes noted. Left Leg Erythematous and swollen but not warm. Ha  a tiny lesion with some blood in int on leg.  No induration; Warm and dry.  Neurologic: CN 2-12 grossly intact with no focal deficits. Has residual Deficits from prior CVA Psychiatric: Normal judgment and insight. Alert and oriented x 3. Agitated mood and appropriate affect.   Data Reviewed: I have personally reviewed following labs and imaging studies  CBC: Recent Labs  Lab 04/13/18 1327 04/14/18 0528  WBC 10.6* 9.0  NEUTROABS 6.2  --   HGB 14.6 14.9  HCT 47.2 49.1  MCV 88.7 89.4  PLT 254 242   Basic Metabolic Panel: Recent Labs  Lab 04/13/18 1327 04/14/18 0528 04/14/18 0824  NA 141 141  --   K 3.4* 3.2*  --   CL 107 109  --   CO2 26 23  --   GLUCOSE 94 85  --   BUN 20 14  --   CREATININE 1.29* 1.09  --   CALCIUM 8.2* 8.0*  --   MG  --   --  2.1  PHOS  --   --  3.2   GFR: Estimated Creatinine Clearance: 68.9 mL/min (by C-G formula based on SCr of 1.09 mg/dL). Liver Function Tests: Recent Labs  Lab 04/14/18 0528  AST 14*  ALT 13  ALKPHOS 130*  BILITOT 0.4  PROT 6.5  ALBUMIN 3.0*   No results for input(s): LIPASE, AMYLASE in the last 168 hours. No results for input(s): AMMONIA in the last 168 hours. Coagulation Profile: No results for input(s): INR, PROTIME in the last 168 hours. Cardiac Enzymes: No results for input(s): CKTOTAL, CKMB, CKMBINDEX, TROPONINI in the last 168 hours. BNP (last 3 results) No results for input(s): PROBNP in the last 8760 hours. HbA1C: No results for input(s): HGBA1C in the last 72 hours. CBG: Recent Labs  Lab 04/14/18 0755  GLUCAP 81   Lipid Profile: No results for input(s): CHOL, HDL, LDLCALC, TRIG, CHOLHDL, LDLDIRECT in the last 72 hours. Thyroid Function Tests: No results for input(s): TSH, T4TOTAL, FREET4, T3FREE, THYROIDAB in the last 72 hours. Anemia Panel: No results for input(s): VITAMINB12, FOLATE, FERRITIN, TIBC, IRON, RETICCTPCT in the last 72 hours. Sepsis Labs: No results for input(s): PROCALCITON,  LATICACIDVEN in the last 168 hours.  No results found for this or any previous visit (from the past 240 hour(s)).   Radiology Studies: No results found.  Scheduled Meds: . enoxaparin (LOVENOX) injection  50 mg Subcutaneous Q24H  . furosemide  40 mg Intravenous BID  . potassium chloride  40 mEq Oral BID   Continuous Infusions: . sodium chloride 10 mL/hr at 04/13/18 1709  . sodium chloride Stopped (04/13/18 1726)  .  piperacillin-tazobactam (ZOSYN)  IV 3.375 g (04/14/18 0612)  . vancomycin      LOS: 1 day   Merlene Laughter, DO [facility] PAGER is on AMION  If 7PM-7AM, please contact night-coverage [email or web address] Password [email or web address] 04/14/2018, 1:00 PM

## 2018-04-14 NOTE — Progress Notes (Signed)
PT Cancellation Note  Patient Details Name: Joel PearsonDaniel Solis MRN: [id number] DOB: 1946-01-19   Cancelled Treatment:    Reason Eval/Treat Not Completed: Patient not medically ready(dopplers and xray of LLE pending, will follow. )  Tamala SerUhlenberg, Camri Molloy Kistler PT 04/14/2018  Acute Rehabilitation Services Pager [phone] Office [phone]

## 2018-04-15 ENCOUNTER — Inpatient Hospital Stay (HOSPITAL_COMMUNITY): Payer: Medicare Other

## 2018-04-15 DIAGNOSIS — Z8673 Personal history of transient ischemic attack (TIA), and cerebral infarction without residual deficits: Secondary | ICD-10-CM

## 2018-04-15 LAB — CBC WITH DIFFERENTIAL/PLATELET
ABS IMMATURE GRANULOCYTES: 0.04 10*3/uL (ref 0.00–0.07)
Basophils Absolute: 0.1 10*3/uL (ref 0.0–0.1)
Basophils Relative: 2 %
EOS ABS: 0.4 10*3/uL (ref 0.0–0.5)
Eosinophils Relative: 4 %
HCT: 49.8 % (ref 39.0–52.0)
Hemoglobin: 15.2 g/dL (ref 13.0–17.0)
IMMATURE GRANULOCYTES: 0 %
Lymphocytes Relative: 30 %
Lymphs Abs: 2.8 10*3/uL (ref 0.7–4.0)
MCH: 27.1 pg (ref 26.0–34.0)
MCHC: 30.5 g/dL (ref 30.0–36.0)
MCV: 88.8 fL (ref 80.0–100.0)
MONOS PCT: 9 %
Monocytes Absolute: 0.8 10*3/uL (ref 0.1–1.0)
NEUTROS ABS: 5.1 10*3/uL (ref 1.7–7.7)
NEUTROS PCT: 55 %
NRBC: 0 % (ref 0.0–0.2)
PLATELETS: 241 10*3/uL (ref 150–400)
RBC: 5.61 MIL/uL (ref 4.22–5.81)
RDW: 13.4 % (ref 11.5–15.5)
WBC: 9.2 10*3/uL (ref 4.0–10.5)

## 2018-04-15 LAB — COMPREHENSIVE METABOLIC PANEL
ALT: 13 U/L (ref 0–44)
AST: 14 U/L — AB (ref 15–41)
Albumin: 3.2 g/dL — ABNORMAL LOW (ref 3.5–5.0)
Alkaline Phosphatase: 120 U/L (ref 38–126)
Anion gap: 9 (ref 5–15)
BUN: 15 mg/dL (ref 8–23)
CHLORIDE: 107 mmol/L (ref 98–111)
CO2: 26 mmol/L (ref 22–32)
Calcium: 8.5 mg/dL — ABNORMAL LOW (ref 8.9–10.3)
Creatinine, Ser: 1.28 mg/dL — ABNORMAL HIGH (ref 0.61–1.24)
GFR calc non Af Amer: 54 mL/min — ABNORMAL LOW (ref >60)
Glucose, Bld: 91 mg/dL (ref 70–99)
POTASSIUM: 3.6 mmol/L (ref 3.5–5.1)
SODIUM: 142 mmol/L (ref 135–145)
Total Bilirubin: 0.6 mg/dL (ref 0.3–1.2)
Total Protein: 6.7 g/dL (ref 6.5–8.1)

## 2018-04-15 LAB — MAGNESIUM: MAGNESIUM: 2 mg/dL (ref 1.7–2.4)

## 2018-04-15 LAB — GLUCOSE, CAPILLARY: Glucose-Capillary: 81 mg/dL (ref 70–99)

## 2018-04-15 LAB — PHOSPHORUS: PHOSPHORUS: 2.8 mg/dL (ref 2.5–4.6)

## 2018-04-15 MED ORDER — POTASSIUM CHLORIDE CRYS ER 20 MEQ PO TBCR
40.0000 meq | EXTENDED_RELEASE_TABLET | Freq: Once | ORAL | Status: AC
  Administered 2018-04-15: 40 meq via ORAL
  Filled 2018-04-15: qty 2

## 2018-04-15 MED ORDER — DOXYCYCLINE HYCLATE 100 MG PO TABS
100.0000 mg | ORAL_TABLET | Freq: Two times a day (BID) | ORAL | Status: DC
  Administered 2018-04-15 – 2018-04-16 (×3): 100 mg via ORAL
  Filled 2018-04-15 (×3): qty 1

## 2018-04-15 NOTE — Evaluation (Signed)
Occupational Therapy Evaluation Patient Details Name: Joel PearsonDaniel Solis MRN: [id number] DOB: 1945/11/07 Today's Date: 04/15/2018    History of Present Illness Joel PearsonDaniel Solis is a 72 y.o. male with medical history significant of with history of essential hypertension, peripheral vascular disease, hyperlipidemia, CVA (2011), L UE/LE hemiplegia, status post VP shunt comes to the hospital for evaluation of left lower extremity swelling and erythema which has worsened.    Clinical Impression   Pt admitted with above diagnoses, presenting with L hemiplegia at baseline from previous stroke limiting ability to engage in BADL at desired level of ind. Pt with extensor tone in LUE, requesting splint like he had previously. Pt with hemiplegic shoulder pain with PROM, noted only AROM with scapula. Bed mobility completed at min A level, pt needing A to rise trunk upright to sit EOB. Pt shares he has "pole" at home that makes him mod I with t/f, had pt simulate home environment during session. Pt denied further OOB activity. Using urinal at bed level and bed pain at bed level this date. Shares that he normally does pivot t/f from power chair at baseline. At this time recommending SNF to maximize ADL/IADL ind and safety, pt not willing so recommend continued HHOT. Will continue to follow pt acutely.    Follow Up Recommendations  SNF;Home health OT    Equipment Recommendations  Other (comment)(resting LUE hand splint)    Recommendations for Other Services       Precautions / Restrictions Precautions Precautions: Fall Precaution Comments: Pt reports 1 fall in last 6 months Restrictions Weight Bearing Restrictions: No      Mobility Bed Mobility Overal bed mobility: Needs Assistance Bed Mobility: Supine to Sit     Supine to sit: Min assist     General bed mobility comments: HOB elevated, A to rise trunk EOB  Transfers Overall transfer level: Needs assistance Equipment used: Hemi-walker(rail of  bed) Transfers: Sit to/from UGI CorporationStand;Stand Pivot Transfers Sit to Stand: Mod assist;Min assist Stand pivot transfers: Mod assist       General transfer comment: deferred OOB mobility    Balance Overall balance assessment: Needs assistance   Sitting balance-Leahy Scale: Fair       Standing balance-Leahy Scale: Poor                             ADL either performed or assessed with clinical judgement   ADL Overall ADL's : Needs assistance/impaired Eating/Feeding: Minimal assistance;Sitting   Grooming: Minimal assistance;Sitting   Upper Body Bathing: Maximal assistance;Sitting Upper Body Bathing Details (indicate cue type and reason): at baseline, getting bed level baths Lower Body Bathing: Total assistance;Sit to/from stand;Cueing for compensatory techniques;With adaptive equipment   Upper Body Dressing : Minimal assistance;Sitting;Cueing for compensatory techniques   Lower Body Dressing: Total assistance;Sit to/from stand;Cueing for compensatory techniques   Toilet Transfer: Maximal assistance Toilet Transfer Details (indicate cue type and reason): stand pivots to surfaces from power chair at baseline Toileting- Clothing Manipulation and Hygiene: Set up;Sit to/from stand;Sitting/lateral lean   Tub/ Shower Transfer: Maximal assistance;Shower Field seismologistseat Tub/Shower Transfer Details (indicate cue type and reason): does not do at baseline   General ADL Comments: pt presenting with LUE extensor tone from old CVA and L hemiplegia affecting baility to engage in ind BADL. Pt uses many AD/AE, power chair at baseline     Vision Baseline Vision/History: No visual deficits Patient Visual Report: No change from baseline       Perception  Praxis      Pertinent Vitals/Pain Pain Assessment: No/denies pain     Hand Dominance Right   Extremity/Trunk Assessment Upper Extremity Assessment Upper Extremity Assessment: Generalized weakness;LUE deficits/detail LUE Deficits /  Details: extensor tone from previous CVA, active shoulder shrug, no active movement further from shoulder   Lower Extremity Assessment Lower Extremity Assessment: LLE deficits/detail LLE Deficits / Details: decreased L LE extension with tendency to adduct with movement due to old CVA LLE Sensation: decreased light touch;decreased proprioception LLE Coordination: decreased gross motor       Communication Communication Communication: No difficulties   Cognition Arousal/Alertness: Awake/alert Behavior During Therapy: WFL for tasks assessed/performed Overall Cognitive Status: Within Functional Limits for tasks assessed                                     General Comments       Exercises     Shoulder Instructions      Home Living Family/patient expects to be discharged to:: Private residence Living Arrangements: Alone Available Help at Discharge: Personal care attendant(aide 2 days per week) Type of Home: Apartment Home Access: Level entry     Home Layout: One level     Bathroom Shower/Tub: Chief Strategy Officer: Standard     Home Equipment: Wheelchair - power;Tub bench;Adaptive equipment;Hospital bed;Other (comment);Grab bars - toilet;Bedside commode(hemi walker) Adaptive Equipment: Reacher Additional Comments: transfer pole in the bedroom where he transfers to the L to get into power chair and to the R to get back into his hospital bed. Has rails at toilet.      Prior Functioning/Environment Level of Independence: Independent with assistive device(s)        Comments: uses transfer pole into and out of bed, hemi walker to assist with transfers. Also had home health RN 1x/week.  HHPT/OT as well.        OT Problem List: Decreased strength;Impaired balance (sitting and/or standing);Impaired tone;Decreased range of motion;Decreased activity tolerance;Decreased knowledge of use of DME or AE;Impaired UE functional use      OT  Treatment/Interventions: Self-care/ADL training;DME and/or AE instruction;Therapeutic activities;Therapeutic exercise;Neuromuscular education;Splinting;Patient/family education    OT Goals(Current goals can be found in the care plan section) Acute Rehab OT Goals Patient Stated Goal: to go home OT Goal Formulation: With patient Time For Goal Achievement: 04/29/18 Potential to Achieve Goals: Good  OT Frequency: Min 2X/week   Barriers to D/C:            Co-evaluation              AM-PAC OT "6 Clicks" Daily Activity     Outcome Measure Help from another person eating meals?: A Little Help from another person taking care of personal grooming?: A Little Help from another person toileting, which includes using toliet, bedpan, or urinal?: A Lot Help from another person bathing (including washing, rinsing, drying)?: Total Help from another person to put on and taking off regular upper body clothing?: A Little Help from another person to put on and taking off regular lower body clothing?: Total 6 Click Score: 13   End of Session Nurse Communication: Mobility status  Activity Tolerance: Patient tolerated treatment well Patient left: in bed;with call bell/phone within reach;with bed alarm set  OT Visit Diagnosis: Other abnormalities of gait and mobility (R26.89);Muscle weakness (generalized) (M62.81);History of falling (Z91.81);Other symptoms and signs involving the nervous system (R29.898);Hemiplegia and hemiparesis Hemiplegia -  Right/Left: Left Hemiplegia - dominant/non-dominant: Non-Dominant Hemiplegia - caused by: (old/previous CVA)                Time: 1610-9604 OT Time Calculation (min): 26 min Charges:  OT General Charges $OT Visit: 1 Visit OT Evaluation $OT Eval Moderate Complexity: 1 Mod OT Treatments $Self Care/Home Management : 8-22 mins  Dalphine Handing, MSOT, OTR/L Behavioral Health OT/ Acute Relief OT [facility] Office: [phone]  Dalphine Handing 04/15/2018, 12:59  PM

## 2018-04-15 NOTE — Evaluation (Signed)
Physical Therapy Evaluation Patient Details Name: Joel Solis MRN: [id number] DOB: 09-10-1945 Today's Date: 04/15/2018   History of Present Illness  Joel Solis is a 72 y.o. male with medical history significant of with history of essential hypertension, peripheral vascular disease, hyperlipidemia, CVA (2011), L UE/LE hemiplegia, status post VP shunt comes to the hospital for evaluation of left lower extremity swelling and erythema which has worsened.   Clinical Impression  Pt admitted with above diagnosis. Pt currently with functional limitations due to the deficits listed below (see PT Problem List).  Pt will benefit from skilled PT to increase their independence and safety with mobility to allow discharge to the venue listed below.  PT simulated pt's bedroom setup as best as possible, but pt needed MIN/ MOD A for transfer. Pt has transfer pole at home and states if he had that he would transfer easily.  Pt is refusing SNF and therefore would benefit from continued HHPT.     Follow Up Recommendations SNF;Home health PT(Pt states he will not go to SNF, therefore recommend HHPT with maximizing services)    Equipment Recommendations  None recommended by PT    Recommendations for Other Services       Precautions / Restrictions Precautions Precautions: Fall Precaution Comments: Pt reports 1 fall in last 6 months Restrictions Weight Bearing Restrictions: No      Mobility  Bed Mobility Overal bed mobility: Modified Independent             General bed mobility comments: HOB elevated  Transfers Overall transfer level: Needs assistance Equipment used: Hemi-walker(rail of bed) Transfers: Sit to/from [employer]GI CorporationStand;Stand Pivot Transfers Sit to Stand: Mod assist;Min assist Stand pivot transfers: Mod assist       General transfer comment: HOB elevated to get rail upwards to simulate transfer pole.  He stood several times with MIN to MOD A and feet blocked to prevent sliding.  after 6  stands, he was able to gt hemi-walker and turn to the L towards chair with MOD A. He returned to bed transferring to the R with use of bed rail only and MIN A.   Ambulation/Gait                Stairs            Wheelchair Mobility    Modified Rankin (Stroke Patients Only) Modified Rankin (Stroke Patients Only) Pre-Morbid Rankin Score: Moderately severe disability Modified Rankin: Moderately severe disability     Balance Overall balance assessment: Needs assistance   Sitting balance-Leahy Scale: Fair       Standing balance-Leahy Scale: Poor                               Pertinent Vitals/Pain Pain Assessment: No/denies pain    Home Living Family/patient expects to be discharged to:: Private residence Living Arrangements: Alone Available Help at Discharge: Personal care attendant(aide 2 days/week) Type of Home: Apartment Home Access: Level entry     Home Layout: One level Home Equipment: Wheelchair - power;Tub bench;Adaptive equipment;Hospital bed;Other (comment);Grab bars - toilet;Bedside commode(hemi-walker) Additional Comments: transfer pole in the bedroom where he transfers to the L to get into power chair and to the R to get back into his hospital bed. Has rails at toilet.    Prior Function           Comments: uses transfer pole into and out of bed, hemi walker to assist with transfers. Also had  home health RN 1x/week.  HHPT/OT as well.     Hand Dominance   Dominant Hand: Right    Extremity/Trunk Assessment   Upper Extremity Assessment Upper Extremity Assessment: Defer to OT evaluation    Lower Extremity Assessment Lower Extremity Assessment: LLE deficits/detail LLE Deficits / Details: decreased L LE extension with tendency to adduct with movement due to old CVA LLE Sensation: decreased light touch;decreased proprioception LLE Coordination: decreased gross motor       Communication   Communication: No difficulties   Cognition Arousal/Alertness: Awake/alert Behavior During Therapy: WFL for tasks assessed/performed Overall Cognitive Status: Within Functional Limits for tasks assessed                                        General Comments      Exercises     Assessment/Plan    PT Assessment Patient needs continued PT services  PT Problem List Decreased strength;Decreased range of motion;Decreased activity tolerance;Decreased balance;Decreased mobility;Decreased knowledge of use of DME       PT Treatment Interventions DME instruction;Therapeutic activities;Balance training;Functional mobility training;Neuromuscular re-education    PT Goals (Current goals can be found in the Care Plan section)  Acute Rehab PT Goals Patient Stated Goal: go home  PT Goal Formulation: With patient Time For Goal Achievement: 04/29/18 Potential to Achieve Goals: Fair    Frequency Min 3X/week   Barriers to discharge Decreased caregiver support      Co-evaluation               AM-PAC PT "6 Clicks" Mobility  Outcome Measure Help needed turning from your back to your side while in a flat bed without using bedrails?: None Help needed moving from lying on your back to sitting on the side of a flat bed without using bedrails?: None Help needed moving to and from a bed to a chair (including a wheelchair)?: A Lot Help needed standing up from a chair using your arms (e.g., wheelchair or bedside chair)?: A Lot Help needed to walk in hospital room?: Total Help needed climbing 3-5 steps with a railing? : Total 6 Click Score: 14    End of Session Equipment Utilized During Treatment: Gait belt Activity Tolerance: Patient tolerated treatment well Patient left: in bed;with call bell/phone within reach;with bed alarm set Nurse Communication: Mobility status PT Visit Diagnosis: Unsteadiness on feet (R26.81);Hemiplegia and hemiparesis Hemiplegia - Right/Left: Left Hemiplegia -  dominant/non-dominant: Non-dominant Hemiplegia - caused by: Cerebral infarction    Time: 1610-9604 PT Time Calculation (min) (ACUTE ONLY): 42 min   Charges:   PT Evaluation $PT Eval Moderate Complexity: 1 Mod PT Treatments $Therapeutic Activity: 23-37 mins        Kimmerly Lora L. Katrinka Blazing, [place] Pager [phone] 04/15/2018   Enzo Montgomery 04/15/2018, 11:26 AM

## 2018-04-15 NOTE — Care Management Note (Signed)
Case Management Note  Patient Details  Name: Tonye PearsonDaniel Gudgel MRN: [id number] Date of Birth: 06-09-1945  Subjective/Objective:  Recurrent lower ext cellulitis, AKI, HTN                  [facility]Plan[facility], OT and aide. If dc back to home, pt [facility]   Expected Discharge Date:              Expected Discharge Plan:  Home w Home Health Services  In-House Referral:  Clinical Social Work  Discharge planning Services  CM Consult  Post Acute Care Choice:  Home Health, Resumption of Svcs/PTA Provider Choice offered to:  Patient  DME Arranged:    DME Agency:     HH Arranged:  RN, PT, OT, Nurse's Aide [facility]  Status of Service:  In process, will continue to follow  If discussed at Long Length of Stay Meetings, dates discussed:    Additional Comments:  Elliot CousinShavis, Abdoul Encinas Ellen, RN 04/15/2018, 1:33 PM

## 2018-04-15 NOTE — Progress Notes (Signed)
CSW acknowledging PT recommendation for SNF, but patient appears to be refusing SNF per chart review. CSW signing off. Please consult CSW if patient becomes agreeable to SNF.  Blenda NicelyElizabeth Landis [place], [place] Clinical Social Worker [phone]

## 2018-04-15 NOTE — Progress Notes (Signed)
PROGRESS NOTE    Joel Solis  ZOX:[id number] DOB: 1945/12/09 DOA: 04/13/2018 PCP: [facility]   Brief Narrative:  HPI On 04/13/2018 by Dr. Stephania Fragmin Joel Solis is a 72 y.o. male with medical history significant of with history of essential hypertension, peripheral vascular disease, hyperlipidemia, status post VP shunt comes to the hospital for evaluation of left lower extremity swelling and erythema which has worsened.  Patient was admitted here about a month ago for left lower extremity cellulitis.  Initially was treated with IV vancomycin and cefepime due to history of pseudomonal infection and eventually transition to oral Bactrim.  He was arranged for home health at home but despite of completing his treatment his swelling and erythema worsened.  Was seen by PCP who sent him to the hospital for further care. Patient denies any fevers and chills at home.  He uses power chair at baseline and admits of accidental foot trauma times. During his previous hospitalization he was evaluated by peripheral vascular disease, on ABI he was found to have moderate disease and referred to outpatient vascular surgery.  He has a follow-up appointment coming up soon.  Interim history Noted for recurrent left lower extremity cellulitis versus venous stasis changes.  Work-up thus far not very convincing.  Patient has been on IV antibiotics, vancomycin and Zosyn for the last 3 days with mild improvement.  X-ray today showed soft tissue swelling.  Lower extremity Doppler unremarkable for DVT.  Patient with acute kidney injury given antibiotics as well as IV Lasix.  Will hold nephrotoxic agents and continue to monitor BMP.  Will transition over to oral doxycycline.  Assessment & Plan   Recurrent left lower extremity cellulitis versus venous stasis changes -Admitted for recurrent cellulitis, 3 times in the last month.  He has failed outpatient treatment with oral antibiotics,  Bactrim. -Extremity Doppler negative for DVT -Currently afebrile with no leukocytosis -Blood cultures show no growth to date -Was started on broad-spectrum antibiotics with vancomycin and Zosyn.  Had been given cefepime in the emergency department -Lower extremity not warm to touch but with mild erythema on exam. -X-ray showed soft tissue changes -Patient was given IV Lasix -The patient would benefit from vascular surgery consult or dermatology consult as an outpatient -Will transition him to doxycycline today and continue to monitor  Acute kidney injury  -Upon review of patient's chart, baseline creatinine 0.8-1, currently 1.28 -Secondary to medications including lisinopril, Lasix, antibiotics -Will hold off on starting IV fluids -Continue to monitor BMP  Essential hypertension -Will hold lisinopril and Lasix -Continue amlodipine, metoprolol  Peripheral vascular disease -Recently had ABI showing moderate arterial disease and lower extremity -Patient has outpatient vascular appointment -Continue statin -Patient is mostly wheelchair-bound  Hyperlipidemia -Continue statin  Hypokalemia -replaced, continue to monitor BMP  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 Suspect home with home health in 1-2 days  Consultants None  Procedures  LLE doppler  Antibiotics   Anti-infectives (From admission, onward)   Start     Dose/Rate Route Frequency Ordered Stop   04/14/18 1730  vancomycin (VANCOCIN) 1,250 mg in sodium chloride 0.9 % 250 mL IVPB  Status:  Discontinued  1,250 mg 166.7 mL/hr  over 90 Minutes Intravenous Every 24 hours 04/13/18 1537 04/15/18 1128   04/13/18 2200  piperacillin-tazobactam (ZOSYN) IVPB 3.375 g  Status:  Discontinued     3.375 g 12.5 mL/hr over 240 Minutes Intravenous Every 8 hours 04/13/18 1537 04/15/18 1128   04/13/18 1700  vancomycin (VANCOCIN) 1,750 mg in sodium chloride 0.9 % 500 mL IVPB     1,750 mg 250 mL/hr over 120 Minutes Intravenous  Once 04/13/18 1537 04/13/18 1928   04/13/18 1530  piperacillin-tazobactam (ZOSYN) IVPB 3.375 g  Status:  Discontinued     3.375 g 100 mL/hr over 30 Minutes Intravenous  Once 04/13/18 1529 04/13/18 1537   04/13/18 1530  vancomycin (VANCOCIN) IVPB 1000 mg/200 mL premix  Status:  Discontinued     1,000 mg 200 mL/hr over 60 Minutes Intravenous  Once 04/13/18 1529 04/13/18 1537   04/13/18 1500  ceFEPIme (MAXIPIME) 1 g in sodium chloride 0.9 % 100 mL IVPB  Status:  Discontinued     1 g 200 mL/hr over 30 Minutes Intravenous  Once 04/13/18 1450 04/13/18 1702   04/13/18 1500  vancomycin (VANCOCIN) IVPB 1000 mg/200 mL premix  Status:  Discontinued     1,000 mg 200 mL/hr over 60 Minutes Intravenous  Once 04/13/18 1453 04/13/18 1537      Subjective:   Joel Solis seen and examined today.  Wanting to go home.  Denies current chest pain, shortness of breath, abdominal pain, nausea or vomiting, diarrhea or constipation, dizziness or headache.  Objective:   Vitals:   04/14/18 0447 04/14/18 0825 04/14/18 2046 04/15/18 0534  BP: 140/77 128/80 124/73 140/79  Pulse: 60 64 68 65  Resp: 16 17 19 18   Temp: 97.9 F (36.6 C) 98 F (36.7 C) 97.6 F (36.4 C) 98.2 F (36.8 C)  TempSrc: Oral Oral Oral Oral  SpO2: 100% 99% 97% 97%  Weight: 95.6 kg   94.4 kg  Height:        Intake/Output Summary (Last 24 hours) at 04/15/2018 1129 Last data filed at 04/15/2018 1100 Gross per 24 hour  Intake 880.96 ml  Output 3101 ml  Net -2220.04 ml   Filed Weights   04/13/18 1456 04/14/18 0447 04/15/18 0534  Weight: 111.1 kg  95.6 kg 94.4 kg    Exam  General: Well developed, well nourished, NAD, appears stated age  HEENT: NCAT, mucous membranes moist.   Neck: Supple  Cardiovascular: S1 S2 auscultated, no rubs, murmurs or gallops. Regular rate and rhythm.  Respiratory: Clear to auscultation bilaterally with equal chest rise  Abdomen: Soft, nontender, nondistended, + bowel sounds  Extremities: warm dry without cyanosis clubbing. Trace LE edema. Mild erythema marked on LLE, no warmth.   Neuro: AAOx3, chronic left sided weakness, otherwise nonfocal  Psych: Appropriate mood and affect   Data Reviewed: I have personally reviewed following labs and imaging studies  CBC: Recent Labs  Lab 04/13/18 1327 04/14/18 0528 04/15/18 0540  WBC 10.6* 9.0 9.2  NEUTROABS 6.2  --  5.1  HGB 14.6 14.9 15.2  HCT 47.2 49.1 49.8  MCV 88.7 89.4 88.8  PLT 254 242 241   Basic Metabolic Panel: Recent Labs  Lab 04/13/18 1327 04/14/18 0528 04/14/18 0824 04/15/18 0540  NA 141 141  --  142  K 3.4* 3.2*  --  3.6  CL 107 109  --  107  CO2 26 23  --  26  GLUCOSE 94 85  --  91  BUN 20 14  --  15  CREATININE 1.29* 1.09  --  1.28*  CALCIUM 8.2* 8.0*  --  8.5*  MG  --   --  2.1 2.0  PHOS  --   --  3.2 2.8   GFR: Estimated Creatinine Clearance: 58.3 mL/min (A) (by C-G formula based on SCr of 1.28 mg/dL (H)). Liver Function Tests: Recent Labs  Lab 04/14/18 0528 04/15/18 0540  AST 14* 14*  ALT 13 13  ALKPHOS 130* 120  BILITOT 0.4 0.6  PROT 6.5 6.7  ALBUMIN 3.0* 3.2*   No results for input(s): LIPASE, AMYLASE in the last 168 hours. No results for input(s): AMMONIA in the last 168 hours. Coagulation Profile: No results for input(s): INR, PROTIME in the last 168 hours. Cardiac Enzymes: No results for input(s): CKTOTAL, CKMB, CKMBINDEX, TROPONINI in the last 168 hours. BNP (last 3 results) No results for input(s): PROBNP in the last 8760 hours. HbA1C: No results for input(s): HGBA1C in the last 72  hours. CBG: Recent Labs  Lab 04/14/18 0755 04/15/18 0742  GLUCAP 81 81   Lipid Profile: No results for input(s): CHOL, HDL, LDLCALC, TRIG, CHOLHDL, LDLDIRECT in the last 72 hours. Thyroid Function Tests: No results for input(s): TSH, T4TOTAL, FREET4, T3FREE, THYROIDAB in the last 72 hours. Anemia Panel: No results for input(s): VITAMINB12, FOLATE, FERRITIN, TIBC, IRON, RETICCTPCT in the last 72 hours. Urine analysis:    Component Value Date/Time   COLORURINE YELLOW 03/15/2018 1125   APPEARANCEUR CLEAR 03/15/2018 1125   LABSPEC 1.020 03/15/2018 1125   PHURINE 5.0 03/15/2018 1125   GLUCOSEU NEGATIVE 03/15/2018 1125   HGBUR NEGATIVE 03/15/2018 1125   BILIRUBINUR NEGATIVE 03/15/2018 1125   KETONESUR NEGATIVE 03/15/2018 1125   PROTEINUR NEGATIVE 03/15/2018 1125   NITRITE NEGATIVE 03/15/2018 1125   LEUKOCYTESUR NEGATIVE 03/15/2018 1125   Sepsis Labs: @LABRCNTIP (procalcitonin:4,lacticidven:4)  ) Recent Results (from the past 240 hour(s))  Culture, blood (routine x 2)     Status: None (Preliminary result)   Collection Time: 04/13/18  3:10 PM  Result Value Ref Range Status   Specimen Description   Final    BLOOD RIGHT HAND Performed at [facility], [address], [place], [place] [address]    Special Requests   Final    BOTTLES DRAWN AEROBIC AND ANAEROBIC Blood Culture adequate volume Performed at [facility], [address], [place], [place] [address]    Culture   Final    NO GROWTH < 24 HOURS Performed at [facility], [address] [place], [place] [address]    Report Status PENDING  Incomplete  Culture, blood (routine x 2)     Status: None (Preliminary result)   Collection Time: 04/13/18  3:10 PM  Result Value Ref Range Status   Specimen Description   Final    BLOOD RIGHT ANTECUBITAL Performed at [facility], [address] [place], [place] [address]    Special Requests   Final    BOTTLES DRAWN  AEROBIC AND ANAEROBIC Blood Culture adequate volume Performed at [facility], [address], [place], [place] [address]    Culture   Final    NO GROWTH < 24 HOURS Performed at [facility], [address], [place], [place] [address]    Report Status PENDING  Incomplete      Radiology Studies: Dg Tibia/fibula Left  Result Date: 04/15/2018 CLINICAL DATA:  LEFT lower extremity swelling and erythema EXAM: LEFT TIBIA AND FIBULA - 2 VIEW  COMPARISON:  02/02/2018 FINDINGS: Osseous demineralization. Knee and ankle joint alignments normal. Significant soft tissue swelling throughout the lower LEFT leg into ankle. No acute fracture, dislocation, or bone destruction. IMPRESSION: Soft tissue swelling LEFT lower leg without acute osseous findings. Electronically Signed   By: Ulyses SouthwardMark  Boles M.D.   On: 04/15/2018 09:03     Scheduled Meds: . amLODipine  10 mg Oral Daily  . atorvastatin  40 mg Oral Daily  . enoxaparin (LOVENOX) injection  50 mg Subcutaneous Q24H  . lisinopril  20 mg Oral Daily  . metoprolol tartrate  100 mg Oral BID   Continuous Infusions: . sodium chloride 10 mL/hr at 04/13/18 1709     LOS: 2 days   Time Spent in minutes   45 minutes (greater than 50% of time spent with patient face to face, as well as reviewing old records, and formulating a plan)   Joel Solis D.O. on 04/15/2018 at 11:29 AM  Between 7am to 7pm - Please see pager noted on [email or web address]  After 7pm go to [email or web address]  And look for the night coverage person covering for me after hours  [facility] Office  [phone]

## 2018-04-16 LAB — GLUCOSE, CAPILLARY: GLUCOSE-CAPILLARY: 96 mg/dL (ref 70–99)

## 2018-04-16 LAB — BASIC METABOLIC PANEL
ANION GAP: 7 (ref 5–15)
BUN: 16 mg/dL (ref 8–23)
CHLORIDE: 109 mmol/L (ref 98–111)
CO2: 25 mmol/L (ref 22–32)
Calcium: 8.6 mg/dL — ABNORMAL LOW (ref 8.9–10.3)
Creatinine, Ser: 1.24 mg/dL (ref 0.61–1.24)
GFR calc Af Amer: >60 mL/min (ref >60)
GFR, EST NON AFRICAN AMERICAN: 56 mL/min — AB (ref >60)
GLUCOSE: 89 mg/dL (ref 70–99)
POTASSIUM: 3.5 mmol/L (ref 3.5–5.1)
Sodium: 141 mmol/L (ref 135–145)

## 2018-04-16 MED ORDER — DOXYCYCLINE HYCLATE 100 MG PO TABS: 100.0000 mg | ORAL_TABLET | Freq: Two times a day (BID) | ORAL | 0 refills | Status: DC

## 2018-04-16 NOTE — Care Management Note (Addendum)
Case Management Note  Patient Details  Name: Joel Solis MRN: [id number] Date of Birth: 04/17/1946  Subjective/Objective:                  [facility]  Kathlene NovemberMike alerted.  Action/Plan: Home by ptar/called at 1000 for transport  Expected Discharge Date:  04/16/18               Expected Discharge Plan:  Home w Home Health Services  In-House Referral:  Clinical Social Work  Discharge planning Services  CM Consult  Post Acute Care Choice:  Home Health, Resumption of Svcs/PTA Provider Choice offered to:  Patient  DME Arranged:    DME Agency:     HH Arranged:  RN, PT, OT, Nurse's Aide HH [facility]  Status of Service:  In process, will continue to follow  If discussed at Long Length of Stay Meetings, dates discussed:    Additional Comments:  Golda AcreDavis, Rhonda Lynn, RN 04/16/2018, 9:55 AM

## 2018-04-16 NOTE — Discharge Instructions (Signed)

## 2018-04-16 NOTE — Discharge Summary (Signed)
Physician Discharge Summary  Joel Solis UJW:[id number] DOB: 01/03/1946 DOA: 04/13/2018  PCP: [facility]  Admit date: 04/13/2018 Discharge date: 04/16/2018  Time spent: 45 minutes  Recommendations for Outpatient Follow-up:  Patient will be discharged to home with home health, physical and occupational therapy.  Patient will need to follow up with primary care provider within one week of discharge, repeat BMP. Follow up with vascular surgery.  Patient should continue medications as prescribed.  Patient should follow a heart healthy diet.   Discharge Diagnoses:  Recurrent left lower extremity cellulitis versus venous stasis changes Acute kidney injury  Essential hypertension Peripheral vascular disease Hyperlipidemia Hypokalemia History of hemorrhagic CVA Obesity  Discharge Condition: stable  Diet recommendation: heart healthy  Filed Weights   04/14/18 0447 04/15/18 0534 04/16/18 0616  Weight: 95.6 kg 94.4 kg 94.2 kg    History of present illness:  On 04/13/2018 by Dr. Emmit Pomfret Powersis a 72 y.o.malewith medical history significant ofwith history of essential hypertension, peripheral vascular disease, hyperlipidemia, status post VP shunt comes to the hospital for evaluation of left lower extremity swelling and erythema which has worsened. Patient was admitted here about a month ago for left lower extremity cellulitis. Initially was treated with IV vancomycin and cefepime due to history of pseudomonal infection and eventually transition to oral Bactrim. He was arranged for home health at home but despite of completing his treatment his swelling and erythema worsened. Was seen by PCP who sent him to the hospital for further care. Patient denies any fevers and chills at home. He uses power chair at baseline and admits of accidental foot trauma times. During his previous hospitalization he was evaluated by peripheral vascular disease, on  ABI he was found to have moderate disease and referred to outpatient vascular surgery. He has a follow-up appointment coming up soon.  Hospital Course:  Recurrent left lower extremity cellulitis versus venous stasis changes -Admitted for recurrent cellulitis, 3 times in the last month.  He has failed outpatient treatment with oral antibiotics, Bactrim. -Extremity Doppler negative for DVT -Currently afebrile with no leukocytosis -Blood cultures show no growth to date -Was started on broad-spectrum antibiotics with vancomycin and Zosyn.  Had been given cefepime in the emergency department -Lower extremity not warm to touch but with mild erythema on exam. -X-ray showed soft tissue changes -Patient was given IV Lasix -The patient would benefit from vascular surgery consult or dermatology consult as an outpatient -Continue doxycycline  Acute kidney injury  -Upon review of patient's chart, baseline creatinine 0.8-1, currently 1.24 -Secondary to medications including lisinopril, Lasix, antibiotics -Will hold off on starting IV fluids -repeat BMP in one week  Essential hypertension -Will hold lisinopril and Lasix -Continue amlodipine, metoprolol  Peripheral vascular disease -Recently had ABI showing moderate arterial disease and lower extremity -Patient has outpatient vascular appointment -Continue statin -Patient is mostly wheelchair-bound  Hyperlipidemia -Continue statin  Hypokalemia -replaced, continue to monitor BMP  History of hemorrhagic CVA/Deconditioning  -History of shunt and residual left-sided weakness -Continue statin -PT/OT recommending SNF however patient refusing, will discharge patient with home health when medically stable  Obesity -BMI of 31.13, patient to follow-up with PCP regarding lifestyle modifications and weight management  Consultants None  Procedures  LLE doppler  Discharge Exam: Vitals:   04/15/18 2109 04/16/18 0616  BP: (!) 145/73  133/78  Pulse: 71 68  Resp: 19 20  Temp: 98.2 F (36.8 C) 98.2 F (36.8 C)  SpO2: 98% 97%   Patient feeling  better today. Denies current chest pain, shortness of breath, abdominal pain, N/V/D/C. States he is ready to go home.   General: Well developed, well nourished, NAD, appears stated age  HEENT: NCAT, mucous membranes moist.  Neck: Supple  Cardiovascular: S1 S2 auscultated, no murmur, RRR  Respiratory: Clear to auscultation bilaterally with equal chest rise  Abdomen: Soft, obese, nontender, nondistended, + bowel sounds  Extremities: warm dry without cyanosis clubbing or edema. Mild erythema, marked on LLE, no warmth.  Neuro: AAOx3, nonfocal  Psych: Appropriate mood and affect, pleasant   Discharge Instructions Discharge Instructions    Diet - low sodium heart healthy   Complete by:  As directed    Discharge instructions   Complete by:  As directed    Patient will be discharged to home with home health, physical and occupational therapy.  Patient will need to follow up with primary care provider within one week of discharge, repeat BMP. Follow up with vascular surgery.  Patient should continue medications as prescribed.  Patient should follow a heart healthy diet.     Allergies as of 04/16/2018      Reactions   Shellfish Allergy Nausea And Vomiting      Medication List    STOP taking these medications   fluconazole 100 MG tablet Commonly known as:  DIFLUCAN   sulfamethoxazole-trimethoprim 800-160 MG tablet Commonly known as:  BACTRIM DS,SEPTRA DS     TAKE these medications   amLODipine 10 MG tablet Commonly known as:  NORVASC Take 10 mg by mouth daily.   atorvastatin 40 MG tablet Commonly known as:  LIPITOR Take 40 mg by mouth daily.   doxycycline 100 MG tablet Commonly known as:  VIBRA-TABS Take 1 tablet (100 mg total) by mouth every 12 (twelve) hours.   furosemide 20 MG tablet Commonly known as:  LASIX Take 20 mg by mouth daily.   lisinopril  20 MG tablet Commonly known as:  PRINIVIL,ZESTRIL Take 20 mg by mouth daily.   metoprolol tartrate 100 MG tablet Commonly known as:  LOPRESSOR Take 100 mg by mouth 2 (two) times daily.   potassium chloride 10 MEQ CR capsule Commonly known as:  MICRO-K Take 2 pills daily for 5 days then continue taking 1 pill a day      Allergies  Allergen Reactions  . Shellfish Allergy Nausea And Vomiting   Follow-up Information    [facility]. Schedule an appointment as soon as possible for a visit in 1 week(s).   Specialty:  Family Medicine Why:  Hospital follow up Contact information: [address] [place] [facility] [place] [address] [phone]            The results of significant diagnostics from this hospitalization (including imaging, microbiology, ancillary and laboratory) are listed below for reference.    Significant Diagnostic Studies: Dg Tibia/fibula Left  Result Date: 04/15/2018 CLINICAL DATA:  LEFT lower extremity swelling and erythema EXAM: LEFT TIBIA AND FIBULA - 2 VIEW COMPARISON:  02/02/2018 FINDINGS: Osseous demineralization. Knee and ankle joint alignments normal. Significant soft tissue swelling throughout the lower LEFT leg into ankle. No acute fracture, dislocation, or bone destruction. IMPRESSION: Soft tissue swelling LEFT lower leg without acute osseous findings. Electronically Signed   By: Ulyses Southward M.D.   On: 04/15/2018 09:03   Vas [place] Lower Extremity Venous (dvt) (only Mc & Wl 7a-7p)  Result Date: 04/15/2018  Lower Venous Study Indications: Edema, and draining.  Performing Technologist: Farrel Demark RDMS, RVT  Examination  Guidelines: A complete evaluation includes B-mode imaging, spectral Doppler, color Doppler, and power Doppler as needed of all accessible portions of each vessel. Bilateral testing is considered an integral part of a complete examination. Limited examinations for reoccurring indications may be performed as noted.   Right Venous Findings: +---+---------------+---------+-----------+----------+-------+    CompressibilityPhasicitySpontaneityPropertiesSummary +---+---------------+---------+-----------+----------+-------+ CFVFull           Yes      Yes                          +---+---------------+---------+-----------+----------+-------+  Left Venous Findings: +---------+---------------+---------+-----------+----------+-------+          CompressibilityPhasicitySpontaneityPropertiesSummary +---------+---------------+---------+-----------+----------+-------+ CFV      Full           Yes                                   +---------+---------------+---------+-----------+----------+-------+ SFJ      Full                                                 +---------+---------------+---------+-----------+----------+-------+ FV Prox  Full                                                 +---------+---------------+---------+-----------+----------+-------+ FV Mid   Full                                                 +---------+---------------+---------+-----------+----------+-------+ FV DistalFull                                                 +---------+---------------+---------+-----------+----------+-------+ PFV      Full                                                 +---------+---------------+---------+-----------+----------+-------+ POP      Full           Yes      Yes                          +---------+---------------+---------+-----------+----------+-------+ PTV      Full                                                 +---------+---------------+---------+-----------+----------+-------+ PERO     Full                                                 +---------+---------------+---------+-----------+----------+-------+ interstitial  fluid noted    Summary: Right: No evidence of common femoral vein obstruction. Left: There is no evidence of deep vein  thrombosis in the lower extremity. No cystic structure found in the popliteal fossa.  *See table(s) above for measurements and observations. Electronically signed by Coral Else MD on 04/15/2018 at 5:33:41 PM.    Final     Microbiology: Recent Results (from the past 240 hour(s))  Culture, blood (routine x 2)     Status: None (Preliminary result)   Collection Time: 04/13/18  3:10 PM  Result Value Ref Range Status   Specimen Description   Final    BLOOD RIGHT HAND Performed at [facility], [address] [place], [place] [address]    Special Requests   Final    BOTTLES DRAWN AEROBIC AND ANAEROBIC Blood Culture adequate volume Performed at [facility], [address], [place], [place] [address]    Culture   Final    NO GROWTH 3 DAYS Performed at [facility], [address] [place], [place] [address]    Report Status PENDING  Incomplete  Culture, blood (routine x 2)     Status: None (Preliminary result)   Collection Time: 04/13/18  3:10 PM  Result Value Ref Range Status   Specimen Description   Final    BLOOD RIGHT ANTECUBITAL Performed at [facility], [address], [place], [place] [address]    Special Requests   Final    BOTTLES DRAWN AEROBIC AND ANAEROBIC Blood Culture adequate volume Performed at [facility], [address] [place], [place] [address]    Culture   Final    NO GROWTH 3 DAYS Performed at [facility], [address] [place], [place] [address]    Report Status PENDING  Incomplete     Labs: Basic Metabolic Panel: Recent Labs  Lab 04/13/18 1327 04/14/18 0528 04/14/18 0824 04/15/18 0540 04/16/18 0558  NA 141 141  --  142 141  K 3.4* 3.2*  --  3.6 3.5  CL 107 109  --  107 109  CO2 26 23  --  26 25  GLUCOSE 94 85  --  91 89  BUN 20 14  --  15 16  CREATININE 1.29* 1.09  --  1.28* 1.24  CALCIUM 8.2* 8.0*  --  8.5* 8.6*  MG  --   --  2.1 2.0  --   PHOS  --    --  3.2 2.8  --    Liver Function Tests: Recent Labs  Lab 04/14/18 0528 04/15/18 0540  AST 14* 14*  ALT 13 13  ALKPHOS 130* 120  BILITOT 0.4 0.6  PROT 6.5 6.7  ALBUMIN 3.0* 3.2*   No results for input(s): LIPASE, AMYLASE in the last 168 hours. No results for input(s): AMMONIA in the last 168 hours. CBC: Recent Labs  Lab 04/13/18 1327 04/14/18 0528 04/15/18 0540  WBC 10.6* 9.0 9.2  NEUTROABS 6.2  --  5.1  HGB 14.6 14.9 15.2  HCT 47.2 49.1 49.8  MCV 88.7 89.4 88.8  PLT 254 242 241   Cardiac Enzymes: No results for input(s): CKTOTAL, CKMB, CKMBINDEX, TROPONINI in the last 168 hours. BNP: BNP (last 3 results) No results for input(s): BNP in the last 8760 hours.  ProBNP (last 3 results) No results for input(s): PROBNP in the last 8760 hours.  CBG: Recent Labs  Lab 04/14/18 0755 04/15/18 0742 04/16/18 0722  GLUCAP  81 81 96       Signed:  Azaylea Maves  [facility] 04/16/2018, 9:44 AM

## 2018-04-18 LAB — CULTURE, BLOOD (ROUTINE X 2)
Culture: NO GROWTH
Culture: NO GROWTH
Special Requests: ADEQUATE
Special Requests: ADEQUATE

## 2018-05-08 ENCOUNTER — Encounter: Payer: Self-pay | Admitting: Vascular Surgery

## 2018-05-08 ENCOUNTER — Other Ambulatory Visit: Payer: Self-pay

## 2018-05-08 ENCOUNTER — Ambulatory Visit (INDEPENDENT_AMBULATORY_CARE_PROVIDER_SITE_OTHER): Payer: Medicare Other | Admitting: Vascular Surgery

## 2018-05-08 DIAGNOSIS — I739 Peripheral vascular disease, unspecified: Secondary | ICD-10-CM | POA: Diagnosis not present

## 2018-05-08 NOTE — Progress Notes (Signed)
Patient name: Joel Solis: [id number] DOB: 04/06/1946 Sex: male  REASON FOR CONSULT: Evaluate for PAD in setting of remote left 2nd toe wound  HPI: Joel Solis is a 72 y.o. male, with history of stroke in 2011 now in a wheelchair, hypertension, hyperlipidemia that presents for evaluation of PAD.  Patient reports when the referral was initially made in October he had a ulcer on his left second toe when he was in the hospital for cellulitis.  He is in a wheelchair at this time but states he is able to stand and pivot after stroke in 2011 with left-sided hemiparesis.  On evaluation today he denies any pain in the foot.  He does endorse some chronic swelling of his left lower extremity and has some skin thickening.  He states that wound has since healed and he not aware of any new tissue loss at this time.  His ABIs were 0.52 on the left and 0.6 on the right in 03/16/18.  Past Medical History:  Diagnosis Date  . Essential hypertension   . Hemorrhagic stroke (HCC)   . HLD (hyperlipidemia)   . S/P VP shunt   . Stroke (HCC)   . Tobacco abuse     Past Surgical History:  Procedure Laterality Date  . cyst      cyst in neck  . VENTRICULOPERITONEAL SHUNT      Family History  Problem Relation Age of Onset  . Intracerebral hemorrhage Mother   . Lung cancer Father   . Esophageal cancer Brother     SOCIAL HISTORY: Social History   Socioeconomic History  . Marital status: Widowed    Spouse name: Not on file  . Number of children: Not on file  . Years of education: Not on file  . Highest education level: Not on file  Occupational History  . Not on file  Social Needs  . Financial resource strain: Not on file  . Food insecurity:    Worry: Not on file    Inability: Not on file  . Transportation needs:    Medical: Not on file    Non-medical: Not on file  Tobacco Use  . Smoking status: Former Smoker    Last attempt to quit: 09/11/2017    Years since quitting: 0.6  .  Smokeless tobacco: Never Used  Substance and Sexual Activity  . Alcohol use: Yes    Comment: not drank in 10 months  . Drug use: No  . Sexual activity: Never  Lifestyle  . Physical activity:    Days per week: Not on file    Minutes per session: Not on file  . Stress: Not on file  Relationships  . Social connections:    Talks on phone: Not on file    Gets together: Not on file    Attends religious service: Not on file    Active member of club or organization: Not on file    Attends meetings of clubs or organizations: Not on file    Relationship status: Not on file  . Intimate partner violence:    Fear of current or ex partner: Not on file    Emotionally abused: Not on file    Physically abused: Not on file    Forced sexual activity: Not on file  Other Topics Concern  . Not on file  Social History Narrative  . Not on file    Allergies  Allergen Reactions  . Shellfish Allergy Nausea And Vomiting    Current  Outpatient Medications  Medication Sig Dispense Refill  . amLODipine (NORVASC) 10 MG tablet Take 10 mg by mouth daily.    Marland Kitchen atorvastatin (LIPITOR) 40 MG tablet Take 40 mg by mouth daily.    Marland Kitchen doxycycline (VIBRA-TABS) 100 MG tablet Take 1 tablet (100 mg total) by mouth every 12 (twelve) hours. 14 tablet 0  . furosemide (LASIX) 20 MG tablet Take 20 mg by mouth daily.    Marland Kitchen lisinopril (PRINIVIL,ZESTRIL) 20 MG tablet Take 20 mg by mouth daily.    . metoprolol tartrate (LOPRESSOR) 100 MG tablet Take 100 mg by mouth 2 (two) times daily.    . potassium chloride (MICRO-K) 10 MEQ CR capsule Take 2 pills daily for 5 days then continue taking 1 pill a day 30 capsule 0   No current facility-administered medications for this visit.     REVIEW OF SYSTEMS:  [X]  denotes positive finding, [ ]  denotes negative finding Cardiac  Comments:  Chest pain or chest pressure:    Shortness of breath upon exertion:    Short of breath when lying flat:    Irregular heart rhythm:          Vascular    Pain in calf, thigh, or hip brought on by ambulation:    Pain in feet at night that wakes you up from your sleep:   Denies  Blood clot in your veins:    Leg swelling:  x Left leg      Pulmonary    Oxygen at home:    Productive cough:     Wheezing:         Neurologic    Sudden weakness in arms or legs:     Sudden numbness in arms or legs:     Sudden onset of difficulty speaking or slurred speech:    Temporary loss of vision in one eye:     Problems with dizziness:         Gastrointestinal    Blood in stool:     Vomited blood:         Genitourinary    Burning when urinating:     Blood in urine:        Psychiatric    Major depression:         Hematologic    Bleeding problems:    Problems with blood clotting too easily:        Skin    Rashes or ulcers:        Constitutional    Fever or chills:      PHYSICAL EXAM: Vitals:   05/08/18 0859  BP: 119/81  Pulse: 70  Resp: 18  Temp: 98 F (36.7 C)  TempSrc: Oral  SpO2: 98%  Weight: 207 lb (93.9 kg)  Height: 5\' 9"  (1.753 m)    GENERAL: The patient is a well-nourished male, in no acute distress. The vital signs are documented above. CARDIAC: There is a regular rate and rhythm.  VASCULAR:  Palpable radial pulse 2+ BUE Hard to appreciate femoral pulses with patient sitting in wheelchair and larger pannus Biphasic signals right DP/PT Monophasic signals left DP/PT No over tissue loss either leg Left leg swelling with lipodermatosclerosis PULMONARY: There is good air exchange bilaterally without wheezing or rales. ABDOMEN: Soft and non-tender with normal pitched bowel sounds.  MUSCULOSKELETAL: There are no major deformities or cyanosis. NEUROLOGIC: Left sided hemiparesis PSYCHIATRIC: The patient has a normal affect.  DATA:   I independently reviewed his noninvasive imaging that shows an  ABI of 0.53 on the left with a monophasic waveform and 0.6 on the right with a biphasic  waveform  Assessment/Plan:  Discussed with Joel Solis that indeed his ABIs suggest at least moderate PAD of his left lower extremity.  Fortunately the left second toe wound that he had in October has since healed and he has no new tissue loss on evaluation today.  He denies any rest pain in the foot that keeps him awake at night as well to suggest critical limb iscehmia.  Even though he is in a wheelchair he is able to stand and pivot and is still using his limbs.  Given the fact that he does not walk to endorse claudication and has no significant rest pain or new tissue loss I see no role for any immediate intervention and he is in agreement with that.  Discussed that he can contact our office if he develops new issues in the future that would warrant reevaluation.  I do think he has some component of venous insufficiency in the left leg and we discussed conservative measures like leg elevation at home, compression etc.   No ulcer or bleeding issues at this time.     Cephus Shelling, MD [facility] Office: [phone] Pager: [phone]

## 2019-09-11 IMAGING — DX DG TIBIA/FIBULA 2V*L*
4 series · 4 of 4 positions shown · non-contrast
Comparison: 02/02/2018

CLINICAL DATA: LEFT lower extremity swelling and erythema

EXAM:
LEFT TIBIA AND FIBULA - 2 VIEW

[tibia ap (1 of 2)]
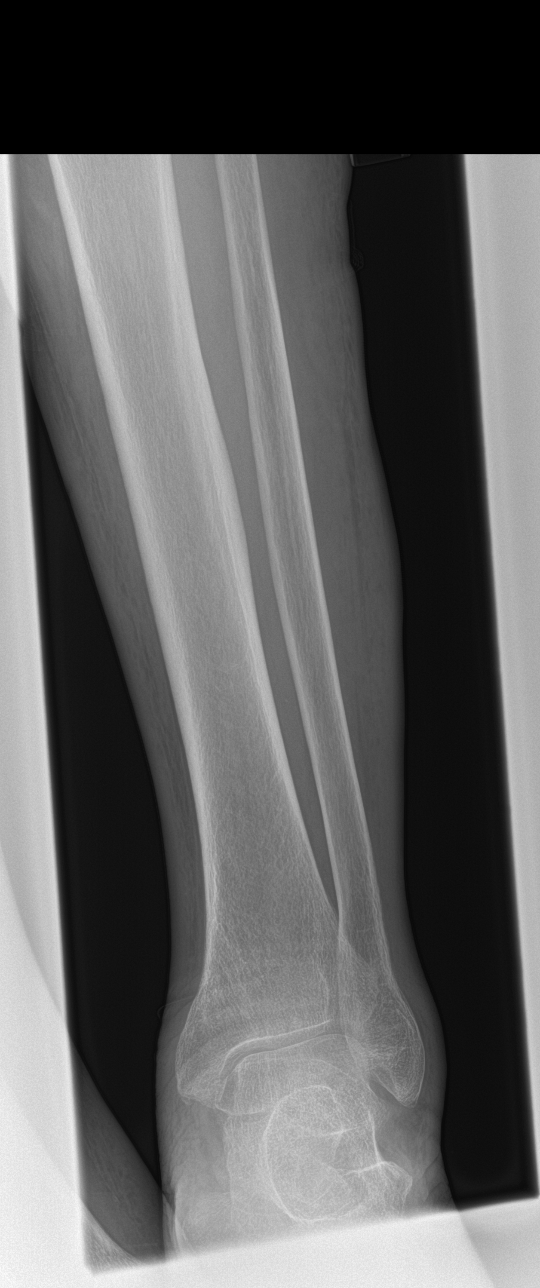

[tibia ap (2 of 2)]
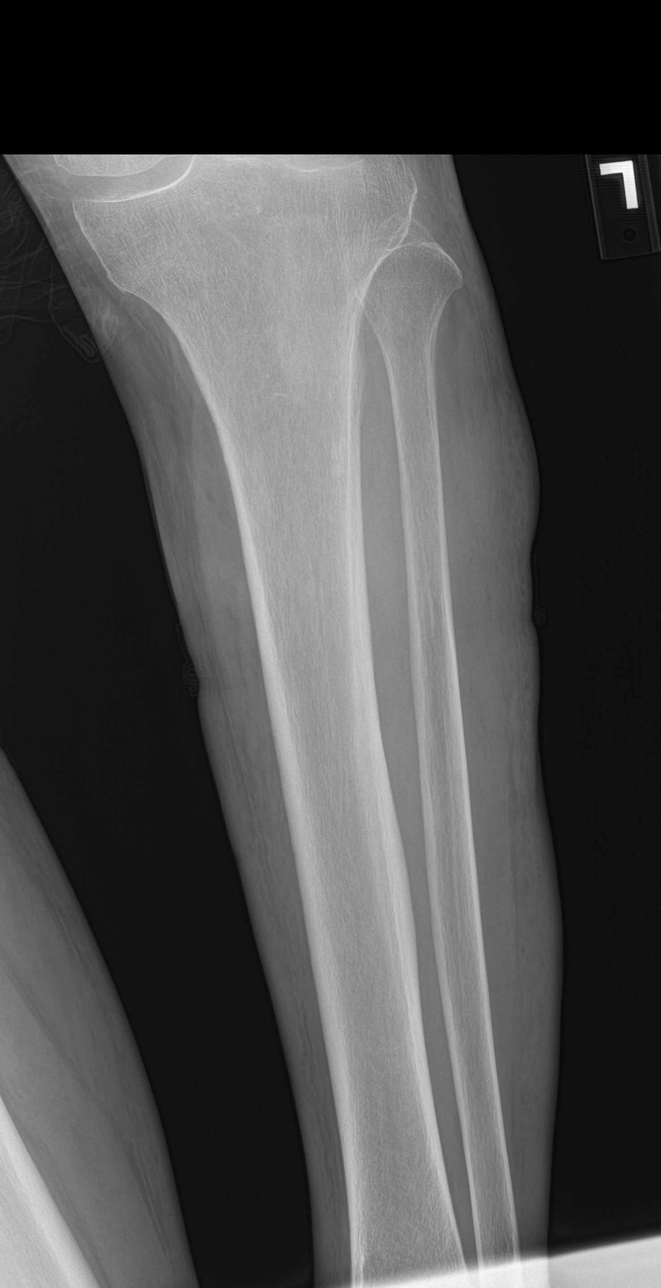

[tibia lat (1 of 2)]
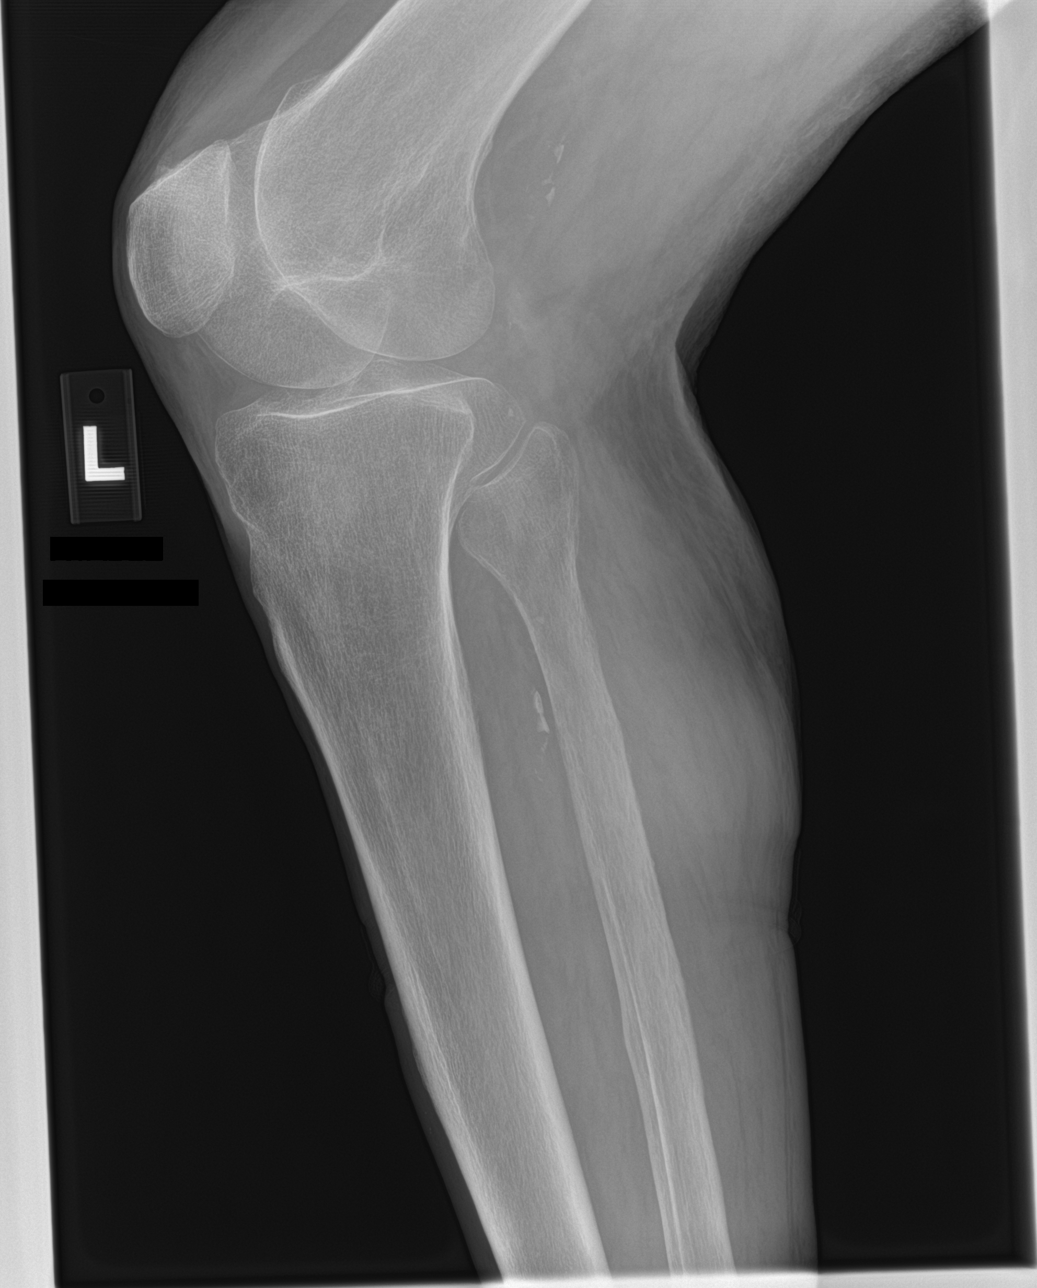

[tibia lat (2 of 2)]
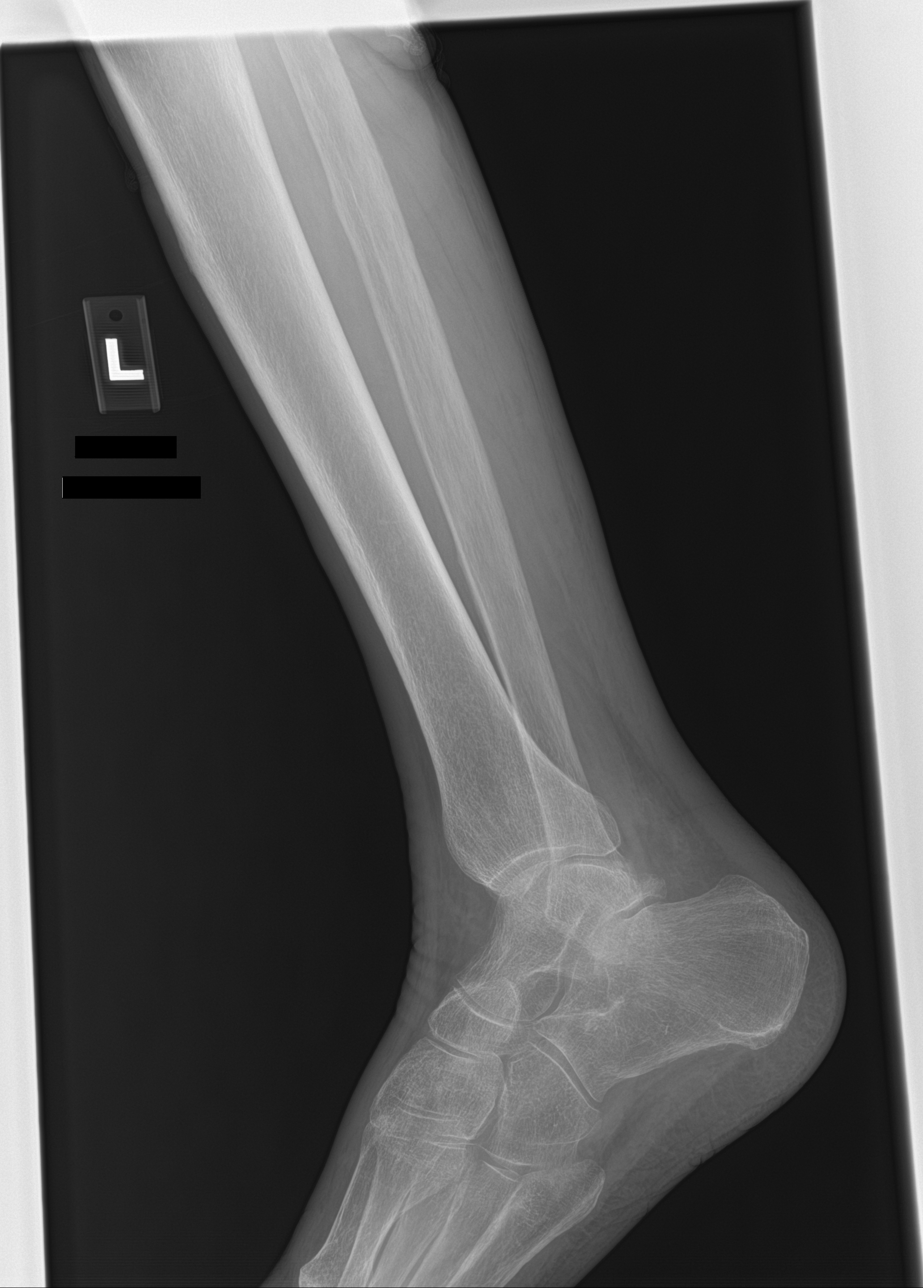

[4 of 4 positions shown; findings below may reference images not displayed]

FINDINGS: Osseous demineralization.

Knee and ankle joint alignments normal.

Significant soft tissue swelling throughout the lower LEFT leg into
ankle.

No acute fracture, dislocation, or bone destruction.
IMPRESSION: Soft tissue swelling LEFT lower leg without acute osseous findings.

## 2022-10-23 ENCOUNTER — Other Ambulatory Visit: Payer: Self-pay

## 2022-10-23 ENCOUNTER — Observation Stay (HOSPITAL_COMMUNITY)
Admission: EM | Admit: 2022-10-23 | Discharge: 2022-10-26 | Disposition: A | Discharge status: Home / Self Care | Payer: No Typology Code available for payment source | Attending: Internal Medicine | Admitting: Internal Medicine

## 2022-10-23 ENCOUNTER — Emergency Department (HOSPITAL_COMMUNITY): Payer: No Typology Code available for payment source

## 2022-10-23 ENCOUNTER — Encounter (HOSPITAL_COMMUNITY): Payer: Self-pay

## 2022-10-23 DIAGNOSIS — E876 Hypokalemia: Secondary | ICD-10-CM | POA: Diagnosis present

## 2022-10-23 DIAGNOSIS — N179 Acute kidney failure, unspecified: Secondary | ICD-10-CM | POA: Diagnosis not present

## 2022-10-23 DIAGNOSIS — R2681 Unsteadiness on feet: Secondary | ICD-10-CM | POA: Insufficient documentation

## 2022-10-23 DIAGNOSIS — Z1152 Encounter for screening for COVID-19: Secondary | ICD-10-CM | POA: Insufficient documentation

## 2022-10-23 DIAGNOSIS — E786 Lipoprotein deficiency: Secondary | ICD-10-CM

## 2022-10-23 DIAGNOSIS — Z87891 Personal history of nicotine dependence: Secondary | ICD-10-CM | POA: Diagnosis not present

## 2022-10-23 DIAGNOSIS — Z8673 Personal history of transient ischemic attack (TIA), and cerebral infarction without residual deficits: Secondary | ICD-10-CM | POA: Insufficient documentation

## 2022-10-23 DIAGNOSIS — R0602 Shortness of breath: Secondary | ICD-10-CM | POA: Insufficient documentation

## 2022-10-23 DIAGNOSIS — I739 Peripheral vascular disease, unspecified: Secondary | ICD-10-CM | POA: Diagnosis not present

## 2022-10-23 DIAGNOSIS — N289 Disorder of kidney and ureter, unspecified: Secondary | ICD-10-CM | POA: Diagnosis not present

## 2022-10-23 DIAGNOSIS — R2689 Other abnormalities of gait and mobility: Secondary | ICD-10-CM | POA: Diagnosis not present

## 2022-10-23 DIAGNOSIS — W19XXXD Unspecified fall, subsequent encounter: Secondary | ICD-10-CM | POA: Diagnosis not present

## 2022-10-23 DIAGNOSIS — I959 Hypotension, unspecified: Secondary | ICD-10-CM | POA: Diagnosis present

## 2022-10-23 DIAGNOSIS — M6281 Muscle weakness (generalized): Secondary | ICD-10-CM | POA: Diagnosis not present

## 2022-10-23 DIAGNOSIS — I69359 Hemiplegia and hemiparesis following cerebral infarction affecting unspecified side: Secondary | ICD-10-CM

## 2022-10-23 DIAGNOSIS — R339 Retention of urine, unspecified: Secondary | ICD-10-CM | POA: Diagnosis not present

## 2022-10-23 DIAGNOSIS — N3000 Acute cystitis without hematuria: Secondary | ICD-10-CM | POA: Diagnosis not present

## 2022-10-23 DIAGNOSIS — Z79899 Other long term (current) drug therapy: Secondary | ICD-10-CM | POA: Diagnosis not present

## 2022-10-23 DIAGNOSIS — I10 Essential (primary) hypertension: Secondary | ICD-10-CM | POA: Diagnosis present

## 2022-10-23 DIAGNOSIS — W19XXXA Unspecified fall, initial encounter: Secondary | ICD-10-CM | POA: Diagnosis present

## 2022-10-23 DIAGNOSIS — N390 Urinary tract infection, site not specified: Secondary | ICD-10-CM | POA: Diagnosis present

## 2022-10-23 LAB — COMPREHENSIVE METABOLIC PANEL
ALT: 12 U/L (ref 0–44)
AST: 23 U/L (ref 15–41)
Albumin: 3 g/dL — ABNORMAL LOW (ref 3.5–5.0)
Alkaline Phosphatase: 105 U/L (ref 38–126)
Anion gap: 14 (ref 5–15)
BUN: 20 mg/dL (ref 8–23)
CO2: 22 mmol/L (ref 22–32)
Calcium: 8.3 mg/dL — ABNORMAL LOW (ref 8.9–10.3)
Chloride: 99 mmol/L (ref 98–111)
Creatinine, Ser: 1.56 mg/dL — ABNORMAL HIGH (ref 0.61–1.24)
GFR, Estimated: 45 mL/min — ABNORMAL LOW (ref >60)
Glucose, Bld: 80 mg/dL (ref 70–99)
Potassium: 2.8 mmol/L — ABNORMAL LOW (ref 3.5–5.1)
Sodium: 135 mmol/L (ref 135–145)
Total Bilirubin: 1.1 mg/dL (ref 0.3–1.2)
Total Protein: 6.1 g/dL — ABNORMAL LOW (ref 6.5–8.1)

## 2022-10-23 LAB — URINALYSIS, ROUTINE W REFLEX MICROSCOPIC
Bilirubin Urine: NEGATIVE
Glucose, UA: NEGATIVE mg/dL
Ketones, ur: NEGATIVE mg/dL
Nitrite: NEGATIVE
Protein, ur: NEGATIVE mg/dL
Specific Gravity, Urine: 1.003 — ABNORMAL LOW (ref 1.005–1.030)
pH: 7 (ref 5.0–8.0)

## 2022-10-23 LAB — I-STAT CHEM 8, ED
BUN: 21 mg/dL (ref 8–23)
Calcium, Ion: 1.06 mmol/L — ABNORMAL LOW (ref 1.15–1.40)
Chloride: 99 mmol/L (ref 98–111)
Creatinine, Ser: 1.5 mg/dL — ABNORMAL HIGH (ref 0.61–1.24)
Glucose, Bld: 75 mg/dL (ref 70–99)
HCT: 41 % (ref 39.0–52.0)
Hemoglobin: 13.9 g/dL (ref 13.0–17.0)
Potassium: 2.8 mmol/L — ABNORMAL LOW (ref 3.5–5.1)
Sodium: 136 mmol/L (ref 135–145)
TCO2: 23 mmol/L (ref 22–32)

## 2022-10-23 LAB — CBC WITH DIFFERENTIAL/PLATELET
Abs Immature Granulocytes: 0.03 10*3/uL (ref 0.00–0.07)
Basophils Absolute: 0.1 10*3/uL (ref 0.0–0.1)
Basophils Relative: 1 %
Eosinophils Absolute: 0.1 10*3/uL (ref 0.0–0.5)
Eosinophils Relative: 1 %
HCT: 42.1 % (ref 39.0–52.0)
Hemoglobin: 13.9 g/dL (ref 13.0–17.0)
Immature Granulocytes: 0 %
Lymphocytes Relative: 33 %
Lymphs Abs: 2.4 10*3/uL (ref 0.7–4.0)
MCH: 29.6 pg (ref 26.0–34.0)
MCHC: 33 g/dL (ref 30.0–36.0)
MCV: 89.6 fL (ref 80.0–100.0)
Monocytes Absolute: 0.6 10*3/uL (ref 0.1–1.0)
Monocytes Relative: 9 %
Neutro Abs: 4.1 10*3/uL (ref 1.7–7.7)
Neutrophils Relative %: 56 %
Platelets: 222 10*3/uL (ref 150–400)
RBC: 4.7 MIL/uL (ref 4.22–5.81)
RDW: 13.1 % (ref 11.5–15.5)
WBC: 7.3 10*3/uL (ref 4.0–10.5)
nRBC: 0 % (ref 0.0–0.2)

## 2022-10-23 LAB — CK TOTAL AND CKMB (NOT AT ARMC)
CK, MB: 14.5 ng/mL — ABNORMAL HIGH (ref 0.5–5.0)
Total CK: 529 U/L — ABNORMAL HIGH (ref 49–397)

## 2022-10-23 LAB — RESP PANEL BY RT-PCR (RSV, FLU A&B, COVID)  RVPGX2
Influenza A by PCR: NEGATIVE
Influenza B by PCR: NEGATIVE
Resp Syncytial Virus by PCR: NEGATIVE
SARS Coronavirus 2 by RT PCR: NEGATIVE

## 2022-10-23 LAB — MAGNESIUM: Magnesium: 1.6 mg/dL — ABNORMAL LOW (ref 1.7–2.4)

## 2022-10-23 LAB — LACTIC ACID, PLASMA
Lactic Acid, Venous: 1.7 mmol/L (ref 0.5–1.9)
Lactic Acid, Venous: 1.2 mmol/L (ref 0.5–1.9)

## 2022-10-23 LAB — APTT: aPTT: 36 seconds (ref 24–36)

## 2022-10-23 LAB — PROTIME-INR
INR: 1.1 (ref 0.8–1.2)
Prothrombin Time: 14.6 seconds (ref 11.4–15.2)

## 2022-10-23 MED ORDER — ATORVASTATIN CALCIUM 40 MG PO TABS
40.0000 mg | ORAL_TABLET | Freq: Every day | ORAL | Status: DC
  Administered 2022-10-23 – 2022-10-26 (×4): 40 mg via ORAL
  Filled 2022-10-23 (×4): qty 1

## 2022-10-23 MED ORDER — SODIUM CHLORIDE 0.9 % IV SOLN
1.0000 g | INTRAVENOUS | Status: DC
  Administered 2022-10-24: 1 g via INTRAVENOUS
  Filled 2022-10-23: qty 10

## 2022-10-23 MED ORDER — SODIUM CHLORIDE 0.9 % IV BOLUS (SEPSIS)
500.0000 mL | Freq: Once | INTRAVENOUS | Status: AC
  Administered 2022-10-23: 500 mL via INTRAVENOUS

## 2022-10-23 MED ORDER — ACETAMINOPHEN 325 MG PO TABS
650.0000 mg | ORAL_TABLET | Freq: Four times a day (QID) | ORAL | Status: DC | PRN
  Administered 2022-10-23 – 2022-10-25 (×4): 650 mg via ORAL
  Filled 2022-10-23 (×4): qty 2

## 2022-10-23 MED ORDER — LACTATED RINGERS IV SOLN: INTRAVENOUS | Status: DC

## 2022-10-23 MED ORDER — ACETAMINOPHEN 650 MG RE SUPP: 650.0000 mg | Freq: Four times a day (QID) | RECTAL | Status: DC | PRN

## 2022-10-23 MED ORDER — SODIUM CHLORIDE 0.9 % IV BOLUS (SEPSIS)
1000.0000 mL | Freq: Once | INTRAVENOUS | Status: AC
  Administered 2022-10-23: 1000 mL via INTRAVENOUS

## 2022-10-23 MED ORDER — ENOXAPARIN SODIUM 40 MG/0.4ML IJ SOSY
40.0000 mg | PREFILLED_SYRINGE | INTRAMUSCULAR | Status: DC
  Administered 2022-10-23 – 2022-10-24 (×2): 40 mg via SUBCUTANEOUS
  Filled 2022-10-23 (×2): qty 0.4

## 2022-10-23 MED ORDER — MAGNESIUM SULFATE 2 GM/50ML IV SOLN
2.0000 g | INTRAVENOUS | Status: AC
  Administered 2022-10-23: 2 g via INTRAVENOUS
  Filled 2022-10-23: qty 50

## 2022-10-23 MED ORDER — ACETAMINOPHEN 500 MG PO TABS
1000.0000 mg | ORAL_TABLET | Freq: Once | ORAL | Status: AC
  Administered 2022-10-23: 1000 mg via ORAL
  Filled 2022-10-23: qty 2

## 2022-10-23 MED ORDER — ONDANSETRON HCL 4 MG/2ML IJ SOLN
4.0000 mg | Freq: Four times a day (QID) | INTRAMUSCULAR | Status: DC | PRN
  Administered 2022-10-25: 4 mg via INTRAVENOUS
  Filled 2022-10-23: qty 2

## 2022-10-23 MED ORDER — POTASSIUM CHLORIDE 10 MEQ/100ML IV SOLN
10.0000 meq | Freq: Once | INTRAVENOUS | Status: AC
  Administered 2022-10-23: 10 meq via INTRAVENOUS
  Filled 2022-10-23: qty 100

## 2022-10-23 MED ORDER — SODIUM CHLORIDE 0.9 % IV SOLN
2.0000 g | INTRAVENOUS | Status: DC
  Administered 2022-10-23: 2 g via INTRAVENOUS
  Filled 2022-10-23: qty 20

## 2022-10-23 MED ORDER — ONDANSETRON HCL 4 MG PO TABS: 4.0000 mg | ORAL_TABLET | Freq: Four times a day (QID) | ORAL | Status: DC | PRN

## 2022-10-23 MED ORDER — POTASSIUM CHLORIDE CRYS ER 20 MEQ PO TBCR
40.0000 meq | EXTENDED_RELEASE_TABLET | Freq: Once | ORAL | Status: AC
  Administered 2022-10-23: 40 meq via ORAL
  Filled 2022-10-23: qty 2

## 2022-10-23 MED ORDER — LACTATED RINGERS IV SOLN: INTRAVENOUS | Status: AC

## 2022-10-23 NOTE — Assessment & Plan Note (Signed)
Secondary to diuretic use Hold furosemide Supplement potassium Check magnesium levels 

## 2022-10-23 NOTE — ED Notes (Signed)
.ED TO INPATIENT HANDOFF REPORT  ED Nurse Name and Phone #: [phone]  S Name/Age/Gender Joel Solis 77 y.o. male Room/Bed: 028C/028C  Code Status   Code Status: DNR  Home/SNF/Other Home Patient oriented to: self, place, time, and situation Is this baseline? Yes   Triage Complete: Triage complete  Chief Complaint Fall [W19.XXXA]  Triage Note PT BIB EMS for weakness and hypotension, did have a fall yesterday, not on thinners, and no injuries. Wounds to right foot, patient lives alone in an apartment, and was moving in today, with assistance by daughter.   BP 80/40  ---  94/58    Allergies Allergies  Allergen Reactions   Shellfish Allergy Nausea And Vomiting    Level of Care/Admitting Diagnosis ED Disposition     ED Disposition  Admit   Condition  --   Comment  Hospital Area: [facility] [[id number]]  Level of Care: Telemetry Medical [104]  May place patient in observation at [facility] or [facility] if equivalent level of care is available:: Yes  Covid Evaluation: Asymptomatic - no recent exposure (last 10 days) testing not required  Diagnosis: Fall [290176]  Admitting Physician: Lonia Mad  Attending Physician: Lonia Mad          B Medical/Surgery History Past Medical History:  Diagnosis Date   Essential hypertension    Hemorrhagic stroke (HCC)    HLD (hyperlipidemia)    S/P VP shunt    Stroke (HCC)    Tobacco abuse    Past Surgical History:  Procedure Laterality Date   cyst      cyst in neck   VENTRICULOPERITONEAL SHUNT       A IV Location/Drains/Wounds Patient Lines/Drains/Airways Status     Active Line/Drains/Airways     Name Placement date Placement time Site Days   Peripheral IV 10/23/22 20 G Anterior;Distal;Right Forearm 10/23/22  1523  Forearm  less than 1   Wound / Incision (Open or Dehisced) 02/02/18 Leg Left;Lower large wound, inner left leg 02/02/18  1800  Leg  1724   Wound /  Incision (Open or Dehisced) 02/02/18 Non-pressure wound;Other (Comment) Leg Left;Lower;Other (Comment) small, circular, scabbed wound 02/02/18  1930  Leg  1724   Wound / Incision (Open or Dehisced) 02/02/18 Non-pressure wound;Other (Comment) Leg Left;Lower small, circular, scabbed wound, ( slightly above other wound on outer left leg) 02/02/18  1930  Leg  1724            Intake/Output Last 24 hours No intake or output data in the 24 hours ending 10/23/22 1914  Labs/Imaging Results for orders placed or performed during the hospital encounter of 10/23/22 (from the past 48 hour(s))  Lactic acid, plasma     Status: None   Collection Time: 10/23/22  3:52 PM  Result Value Ref Range   Lactic Acid, Venous 1.7 0.5 - 1.9 mmol/L    Comment: Performed at [facility], [address], [place], [place] [address]  Comprehensive metabolic panel     Status: Abnormal   Collection Time: 10/23/22  3:52 PM  Result Value Ref Range   Sodium 135 135 - 145 mmol/L   Potassium 2.8 (L) 3.5 - 5.1 mmol/L   Chloride 99 98 - 111 mmol/L   CO2 22 22 - 32 mmol/L   Glucose, Bld 80 70 - 99 mg/dL    Comment: Glucose reference range applies only to samples taken after fasting for at least 8 hours.   BUN 20 8 -  23 mg/dL   Creatinine, Ser 6.23 (H) 0.61 - 1.24 mg/dL   Calcium 8.3 (L) 8.9 - 10.3 mg/dL   Total Protein 6.1 (L) 6.5 - 8.1 g/dL   Albumin 3.0 (L) 3.5 - 5.0 g/dL   AST 23 15 - 41 U/L   ALT 12 0 - 44 U/L   Alkaline Phosphatase 105 38 - 126 U/L   Total Bilirubin 1.1 0.3 - 1.2 mg/dL   GFR, Estimated 45 (L) >60 mL/min    Comment: (NOTE) Calculated using the CKD-EPI Creatinine Equation (2021)    Anion gap 14 5 - 15    Comment: Performed at [facility], [address] [place], [place] [address]  CBC with Differential     Status: None   Collection Time: 10/23/22  3:52 PM  Result Value Ref Range   WBC 7.3 4.0 - 10.5 K/uL   RBC 4.70 4.22 - 5.81 MIL/uL   Hemoglobin 13.9 13.0 - 17.0 g/dL   HCT 15.1  76.1 - 60.7 %   MCV 89.6 80.0 - 100.0 fL   MCH 29.6 26.0 - 34.0 pg   MCHC 33.0 30.0 - 36.0 g/dL   RDW 37.1 06.2 - 69.4 %   Platelets 222 150 - 400 K/uL   nRBC 0.0 0.0 - 0.2 %   Neutrophils Relative % 56 %   Neutro Abs 4.1 1.7 - 7.7 K/uL   Lymphocytes Relative 33 %   Lymphs Abs 2.4 0.7 - 4.0 K/uL   Monocytes Relative 9 %   Monocytes Absolute 0.6 0.1 - 1.0 K/uL   Eosinophils Relative 1 %   Eosinophils Absolute 0.1 0.0 - 0.5 K/uL   Basophils Relative 1 %   Basophils Absolute 0.1 0.0 - 0.1 K/uL   Immature Granulocytes 0 %   Abs Immature Granulocytes 0.03 0.00 - 0.07 K/uL    Comment: Performed at [facility], [address], [place], [place] [address]  Protime-INR     Status: None   Collection Time: 10/23/22  3:52 PM  Result Value Ref Range   Prothrombin Time 14.6 11.4 - 15.2 seconds   INR 1.1 0.8 - 1.2    Comment: (NOTE) INR goal varies based on device and disease states. Performed at [facility], [address], [place], [place] [address]   APTT     Status: None   Collection Time: 10/23/22  3:52 PM  Result Value Ref Range   aPTT 36 24 - 36 seconds    Comment: Performed at [facility], [address] [place], [place] [address]  I-Stat Chem 8, ED     Status: Abnormal   Collection Time: 10/23/22  4:38 PM  Result Value Ref Range   Sodium 136 135 - 145 mmol/L   Potassium 2.8 (L) 3.5 - 5.1 mmol/L   Chloride 99 98 - 111 mmol/L   BUN 21 8 - 23 mg/dL   Creatinine, Ser 8.29 (H) 0.61 - 1.24 mg/dL   Glucose, Bld 75 70 - 99 mg/dL    Comment: Glucose reference range applies only to samples taken after fasting for at least 8 hours.   Calcium, Ion 1.06 (L) 1.15 - 1.40 mmol/L   TCO2 23 22 - 32 mmol/L   Hemoglobin 13.9 13.0 - 17.0 g/dL   HCT 93.7 16.9 - 67.8 %  Urinalysis, Routine w reflex microscopic -Urine, Clean Catch     Status: Abnormal   Collection Time: 10/23/22  5:35 PM  Result Value Ref  Range   Color, Urine YELLOW YELLOW   APPearance HAZY (A) CLEAR    Specific Gravity, Urine 1.003 (L) 1.005 - 1.030   pH 7.0 5.0 - 8.0   Glucose, UA NEGATIVE NEGATIVE mg/dL   Hgb urine dipstick LARGE (A) NEGATIVE   Bilirubin Urine NEGATIVE NEGATIVE   Ketones, ur NEGATIVE NEGATIVE mg/dL   Protein, ur NEGATIVE NEGATIVE mg/dL   Nitrite NEGATIVE NEGATIVE   Leukocytes,Ua LARGE (A) NEGATIVE   RBC / HPF 6-10 0 - 5 RBC/hpf   WBC, UA 21-50 0 - 5 WBC/hpf   Bacteria, UA RARE (A) NONE SEEN   Squamous Epithelial / HPF 0-5 0 - 5 /HPF   Mucus PRESENT    Hyaline Casts, UA PRESENT     Comment: Performed at [facility], [address], [place], [place] [address]   DG Foot Complete Left  Result Date: 10/23/2022 CLINICAL DATA:  Redness and swelling.  Injury. EXAM: LEFT FOOT - COMPLETE 3+ VIEW COMPARISON:  Left foot x-ray 03/15/2018 FINDINGS: The bones are osteopenic. There is no acute fracture or dislocation identified. No significant degenerative change. There is mild soft tissue swelling of the foot diffusely. IMPRESSION: 1. No acute fracture or dislocation. 2. Osteopenia. 3. Mild soft tissue swelling. Electronically Signed   By: Darliss Cheney M.D.   On: 10/23/2022 16:22   DG Chest Port 1 View  Result Date: 10/23/2022 CLINICAL DATA:  Questionable sepsis EXAM: PORTABLE CHEST 1 VIEW COMPARISON:  Chest x-ray dated March 15, 2018 FINDINGS: Cardiac and mediastinal contours are within normal limits. Shunt catheter tubing partially visualized coursing the right hemithorax. Mild bibasilar opacities. No evidence of pleural effusion or pneumothorax. IMPRESSION: Mild bibasilar opacities, likely atelectasis. Electronically Signed   By: Allegra Lai M.D.   On: 10/23/2022 16:19    Pending Labs Unresulted Labs (From admission, onward)     Start     Ordered   10/30/22 0500  Creatinine, serum  (enoxaparin (LOVENOX)    CrCl >/= 30 ml/min)  Weekly,   R     Comments: while on enoxaparin therapy    10/23/22 1847   10/24/22 0500  CBC  Tomorrow morning,   R        10/23/22 1847    10/24/22 0500  Basic metabolic panel  Tomorrow morning,   R        10/23/22 1847   10/23/22 1848  CK total and CKMB (cardiac)not at [facility]  Once,   R        10/23/22 1849   10/23/22 1848  Magnesium  Once,   R        10/23/22 1849   10/23/22 1518  Urine Culture (for pregnant, neutropenic or urologic patients or patients with an indwelling urinary catheter)  (Septic presentation on arrival (screening labs, nursing and treatment orders for obvious sepsis))  Once,   URGENT       Question:  Indication  Answer:  Sepsis   10/23/22 1517   10/23/22 1517  Resp panel by RT-PCR (RSV, Flu A&B, Covid) Anterior Nasal Swab  (Septic presentation on arrival (screening labs, nursing and treatment orders for obvious sepsis))  Once,   URGENT        10/23/22 1517   10/23/22 1517  Lactic acid, plasma  (Septic presentation on arrival (screening labs, nursing and treatment orders for obvious sepsis))  Now then every 2 hours,   R (with STAT occurrences)      10/23/22 1517   10/23/22 1517  Blood Culture (routine x 2)  (Septic presentation on arrival (screening labs, nursing and treatment orders for obvious sepsis))  BLOOD CULTURE X 2,   STAT      10/23/22 1517            Vitals/Pain Today's Vitals   10/23/22 1530 10/23/22 1534 10/23/22 1545 10/23/22 1550  BP: 98/64  (!) 107/58   Pulse: (!) 58  69 62  Resp: 19  16 16   Temp:  98.6 F (37 C)    TempSrc:  Oral    SpO2: 100%  100% 100%  Weight:      Height:        Isolation Precautions No active isolations  Medications Medications  sodium chloride 0.9 % bolus 1,000 mL (1,000 mLs Intravenous New Bag/Given 10/23/22 1524)    And  sodium chloride 0.9 % bolus 1,000 mL (1,000 mLs Intravenous New Bag/Given 10/23/22 1637)    And  sodium chloride 0.9 % bolus 500 mL (has no administration in time range)  magnesium sulfate IVPB 2 g 50 mL (has no administration in time range)  potassium chloride 10 mEq in 100 mL IVPB (has no administration in time range)   atorvastatin (LIPITOR) tablet 40 mg (has no administration in time range)  enoxaparin (LOVENOX) injection 40 mg (has no administration in time range)  acetaminophen (TYLENOL) tablet 650 mg (has no administration in time range)    Or  acetaminophen (TYLENOL) suppository 650 mg (has no administration in time range)  ondansetron (ZOFRAN) tablet 4 mg (has no administration in time range)    Or  ondansetron (ZOFRAN) injection 4 mg (has no administration in time range)  potassium chloride SA (KLOR-CON M) CR tablet 40 mEq (has no administration in time range)  potassium chloride SA (KLOR-CON M) CR tablet 40 mEq (has no administration in time range)  cefTRIAXone (ROCEPHIN) 1 g in sodium chloride 0.9 % 100 mL IVPB (has no administration in time range)  lactated ringers infusion (has no administration in time range)  acetaminophen (TYLENOL) tablet 1,000 mg (1,000 mg Oral Given 10/23/22 1640)    Mobility non-ambulatory     Focused Assessments Cardiac Assessment Handoff:    Lab Results  Component Value Date   TROPONINI 0.04 (HH) 11/16/2016   No results found for: "DDIMER" Does the Patient currently have chest pain? No   , Neuro Assessment Handoff:  Swallow screen pass? Yes          Neuro Assessment:   Neuro Checks:      Has TPA been given? No If patient is a Neuro Trauma and patient is going to OR before floor call report to 4N Charge nurse: [phone] or [phone]   R Recommendations: See Admitting Provider Note  Report given to:   Additional Notes: Pt Aox4, left side deficits from previous stroke nonambulatory, septic workup. Urinary retention.

## 2022-10-23 NOTE — ED Notes (Addendum)
Attempted to pass foley catheter x 2 unsuccessful. One with regular 46F, second with coude and unable to pass.

## 2022-10-23 NOTE — H&P (Signed)
History and Physical    Patient: Joel Solis XHB:[id number] DOB: 1945/09/27 DOA: 10/23/2022 DOS: the patient was seen and examined on 10/23/2022 PCP: [facility]  Patient coming from: Home  Chief Complaint:  Chief Complaint  Patient presents with   Hypotension    weakness   HPI: Joel Solis is a 77 y.o. male with medical history significant for hemorrhagic stroke with left-sided hemiparesis (uses a power chair and is nonambulatory), hypertension, dyslipidemia, peripheral arterial disease, nicotine dependence who presents to the emergency room via EMS for evaluation of weakness and a fall 1 day prior to his admission. Patient lives alone and gets around his apartment in a power chair.  He has home health services 3 times a week.  He fell the day prior to his admission and states that he slid off the edge of the bed landing on the floor.  He was unable to get up and EMS was called.  They were able to pick him up off the floor but patient declined transport to the emergency room. On the day of his admission his daughter had called EMS due to progressive weakness and upon the arrival he was noted to be hypotensive with blood pressure 80/40.  He received IV fluid resuscitation with improvement in his blood pressure to 90 systolic.  He denies feeling dizzy or lightheaded and denies having any syncopal episode.   According to the patient his oral intake is good and he denies having any nausea, no vomiting, no diarrhea.  He denies having any chest pain, no shortness of breath, no headache, no blurred vision, no palpitations, no diaphoresis, no cough, no fever, no chills. He admits to having episodes of difficulty voiding and feeling of incomplete voiding.  In the ER there was a concern for possible urinary retention and he had a bladder scan which yielded greater than 268 mL of urine.  Attempts were made to place a Foley catheter in the emergency room without  success. Abnormal labs include potassium of 2.8, creatinine of 1.56 above a baseline of 1.24 and pyuria. Chest x-ray reviewed by me shows mild bibasilar opacities, likely atelectasis.  Left foot x-ray shows no acute fracture or dislocation. Osteopenia. Mild soft tissue swelling. Twelve-lead EKG shows artifact.  PVCs He received a dose of Rocephin 2 g IV x 1 dose, IV fluid resuscitation with LR, IV magnesium as well as IV potassium He will be referred to observation status for further evaluation  Review of Systems: As mentioned in the history of present illness. All other systems reviewed and are negative. Past Medical History:  Diagnosis Date   Essential hypertension    Hemorrhagic stroke (HCC)    HLD (hyperlipidemia)    S/P VP shunt    Stroke (HCC)    Tobacco abuse    Past Surgical History:  Procedure Laterality Date   cyst      cyst in neck   VENTRICULOPERITONEAL SHUNT     Social History:  reports that he quit smoking about 5 years ago. He has never used smokeless tobacco. He reports current alcohol use. He reports that he does not use drugs.  Allergies  Allergen Reactions   Shellfish Allergy Nausea And Vomiting    Family History  Problem Relation Age of Onset   Intracerebral hemorrhage Mother    Lung cancer Father    Esophageal cancer Brother     Prior to Admission medications   Medication Sig Start Date End Date Taking? Authorizing Provider  amLODipine (  NORVASC) 10 MG tablet Take 10 mg by mouth daily.    [provider]  atorvastatin (LIPITOR) 40 MG tablet Take 40 mg by mouth daily.    [provider]  doxycycline (VIBRA-TABS) 100 MG tablet Take 1 tablet (100 mg total) by mouth every 12 (twelve) hours. 04/16/18   Mikhail, Nita Sells, DO  furosemide (LASIX) 20 MG tablet Take 20 mg by mouth daily. 04/02/18   [provider]  lisinopril (PRINIVIL,ZESTRIL) 20 MG tablet Take 20 mg by mouth daily.    [provider]  metoprolol tartrate  (LOPRESSOR) 100 MG tablet Take 100 mg by mouth 2 (two) times daily.    [provider]  potassium chloride (MICRO-K) 10 MEQ CR capsule Take 2 pills daily for 5 days then continue taking 1 pill a day 03/18/18   Burnadette Pop, MD    Physical Exam: Vitals:   10/23/22 1530 10/23/22 1534 10/23/22 1545 10/23/22 1550  BP: 98/64  (!) 107/58   Pulse: (!) 58  69 62  Resp: 19  16 16   Temp:  98.6 F (37 C)    TempSrc:  Oral    SpO2: 100%  100% 100%  Weight:      Height:       Physical Exam Vitals and nursing note reviewed.  Constitutional:      Comments: Chronically ill-appearing.  Disheveled  HENT:     Nose: Nose normal.     Mouth/Throat:     Mouth: Mucous membranes are dry.  Eyes:     Conjunctiva/sclera: Conjunctivae normal.  Cardiovascular:     Rate and Rhythm: Normal rate and regular rhythm.  Pulmonary:     Effort: Pulmonary effort is normal.     Breath sounds: Normal breath sounds.  Abdominal:     General: Abdomen is flat. Bowel sounds are normal.     Palpations: Abdomen is soft.  Musculoskeletal:     Cervical back: Normal range of motion and neck supple.     Comments: Contracted left upper extremity  Skin:    Findings: Erythema present.     Comments: Redness involving the toes on the right foot  Neurological:     Mental Status: He is alert.     Motor: Weakness present.     Comments: Left-sided hemiparesis  Psychiatric:        Mood and Affect: Mood normal.        Behavior: Behavior normal.     Data Reviewed: Relevant notes from primary care and specialist visits, past discharge summaries as available in EHR, including Care Everywhere. Prior diagnostic testing as pertinent to current admission diagnoses Updated medications and problem lists for reconciliation ED course, including vitals, labs, imaging, treatment and response to treatment Triage notes, nursing and pharmacy notes and ED provider's notes Notable results as noted in HPI Labs reviewed.  Lactic  acid 1.7, sodium 135, potassium 2.8, chloride 99, bicarb 22, glucose 80, BUN 20, creatinine 1.56 compared to baseline of 1.24, calcium 8.3, total protein 6.1, albumin 3.0, AST 23, ALT 12, alkaline phosphatase 105, total bilirubin 1.1, PT 14.6, INR 1.1, white count 7.3, hemoglobin 13.9, hematocrit 42.1, platelet count 222 Urinalysis shows pyuria There are no new results to review at this time.  Assessment and Plan: * Fall Patient with a history of a prior stroke with left-sided hemiparesis who presents to the ER for evaluation following a fall at home  He is not ambulatory and gets around his house using a power chair Will place patient  on fall precautions Will request PT evaluation Obtain total CK levels  Hypokalemia Secondary to diuretic use Hold furosemide Supplement potassium Check magnesium levels  Urinary retention Noted to have urinary retention Attempts were made by ER staff to place Foley catheter without success Urology consult placed by ER doc  UTI (urinary tract infection) Patient noted to have urinary retention and pyuria Continue empiric antibiotic therapy with Rocephin Follow-up results of urine culture  CVA, old, hemiparesis (HCC) Patient with a history of prior CVA with left-sided hemiparesis Continue atorvastatin  PAD (peripheral artery disease) (HCC) Continue atorvastatin  AKI (acute kidney injury) (HCC) Most likely secondary to ATN from hypotension Patient has a baseline serum creatinine of 1.24 and on admission it was 1.56 Hold all antihypertensive medications Hold diuretic therapy for now Obtain total CK levels Hydrate patient and repeat electrolytes in a.m.  Essential hypertension Patient with a history of hypertension on multiple antihypertensive medications which will be placed on hold due to hypotension. Hold lisinopril, metoprolol and amlodipine       Advance Care Planning:   Code Status: DNR   Consults: Urology  Family Communication:  Greater than 50% of time was spent discussing patient's condition and plan of care with him at the bedside.  All questions and concerns have been addressed.  He verbalizes understanding and agrees with the plan.  CODE STATUS was discussed and he wishes to be a DNR.  He lists his son as his healthcare power of attorney.  Severity of Illness: The appropriate patient status for this patient is OBSERVATION. Observation status is judged to be reasonable and necessary in order to provide the required intensity of service to ensure the patient's safety. The patient's presenting symptoms, physical exam findings, and initial radiographic and laboratory data in the context of their medical condition is felt to place them at decreased risk for further clinical deterioration. Furthermore, it is anticipated that the patient will be medically stable for discharge from the hospital within 2 midnights of admission.   Author: Lucile Shutters, MD 10/23/2022 7:15 PM  For on call review [email or web address].

## 2022-10-23 NOTE — ED Notes (Signed)
2 RNS Attempted In and out catheter and was unsuccessful. EDP aware and gave verbal order to place a coude catheter.

## 2022-10-23 NOTE — ED Notes (Signed)
Bladder scan 268

## 2022-10-23 NOTE — ED Triage Notes (Signed)
PT BIB EMS for weakness and hypotension, did have a fall yesterday, not on thinners, and no injuries. Wounds to right foot, patient lives alone in an apartment, and was moving in today, with assistance by daughter.   BP 80/40  ---  09/81

## 2022-10-23 NOTE — Assessment & Plan Note (Signed)
Patient with a history of hypertension on multiple antihypertensive medications which will be placed on hold due to hypotension. Hold lisinopril, metoprolol and amlodipine

## 2022-10-23 NOTE — Assessment & Plan Note (Signed)
Patient with a history of prior CVA with left-sided hemiparesis Continue atorvastatin

## 2022-10-23 NOTE — Assessment & Plan Note (Signed)
Continue atorvastatin

## 2022-10-23 NOTE — Assessment & Plan Note (Signed)
Noted to have urinary retention Attempts were made by ER staff to place Foley catheter without success Urology consult placed by ER doc

## 2022-10-23 NOTE — Assessment & Plan Note (Signed)
Most likely secondary to ATN from hypotension Patient has a baseline serum creatinine of 1.24 and on admission it was 1.56 Hold all antihypertensive medications Hold diuretic therapy for now Obtain total CK levels Hydrate patient and repeat electrolytes in a.m.

## 2022-10-23 NOTE — ED Provider Notes (Signed)
[place] EMERGENCY DEPARTMENT AT [facility] Provider Note   CSN: [id number] Arrival date & time: 10/23/22  1458     History  Chief Complaint  Patient presents with   Hypotension    weakness    Joel Solis is a 77 y.o. male.  HPI   This patient is a 77 year old male, history of hypertension on amlodipine lisinopril and metoprolol, he does have a history of a prior stroke leaving him with left-sided hemiplegia, evidently the patient lives by himself alone in an apartment, his daughter came over to see him today and noted that he was not looking well, he was generally weak and noted to be hypotensive by paramedics with a pressure of 80/40.  IV fluids were started the patient was brought to the hospital.  He has no complaints except for urinary frequency, he states he has been eating and drinking but having a lot of difficulty getting from the bed to his wheelchair.  Home Medications Prior to Admission medications   Medication Sig Start Date End Date Taking? Authorizing Provider  amLODipine (NORVASC) 10 MG tablet Take 10 mg by mouth daily.    [provider]  atorvastatin (LIPITOR) 40 MG tablet Take 40 mg by mouth daily.    [provider]  doxycycline (VIBRA-TABS) 100 MG tablet Take 1 tablet (100 mg total) by mouth every 12 (twelve) hours. 04/16/18   Mikhail, Nita Sells, DO  furosemide (LASIX) 20 MG tablet Take 20 mg by mouth daily. 04/02/18   [provider]  lisinopril (PRINIVIL,ZESTRIL) 20 MG tablet Take 20 mg by mouth daily.    [provider]  metoprolol tartrate (LOPRESSOR) 100 MG tablet Take 100 mg by mouth 2 (two) times daily.    [provider]  potassium chloride (MICRO-K) 10 MEQ CR capsule Take 2 pills daily for 5 days then continue taking 1 pill a day 03/18/18   Burnadette Pop, MD      Allergies    Shellfish allergy    Review of Systems   Review of Systems  All other systems reviewed and are  negative.   Physical Exam Updated Vital Signs BP (!) 107/58   Pulse 62   Temp 98.6 F (37 C) (Oral)   Resp 16   Ht 1.753 m (5\' 9" )   Wt 79.4 kg   SpO2 100%   BMI 25.84 kg/m  Physical Exam Vitals and nursing note reviewed.  Constitutional:      General: He is not in acute distress.    Appearance: He is well-developed. He is ill-appearing.  HENT:     Head: Normocephalic and atraumatic.     Mouth/Throat:     Pharynx: No oropharyngeal exudate.  Eyes:     General: No scleral icterus.       Right eye: No discharge.        Left eye: No discharge.     Conjunctiva/sclera: Conjunctivae normal.     Pupils: Pupils are equal, round, and reactive to light.  Neck:     Thyroid: No thyromegaly.     Vascular: No JVD.  Cardiovascular:     Rate and Rhythm: Normal rate and regular rhythm.     Heart sounds: Normal heart sounds. No murmur heard.    No friction rub. No gallop.  Pulmonary:     Effort: Pulmonary effort is normal. No respiratory distress.     Breath sounds: Normal breath sounds. No wheezing or rales.  Abdominal:     General: Bowel sounds  are normal. There is no distension.     Palpations: Abdomen is soft. There is no mass.     Tenderness: There is no abdominal tenderness.  Musculoskeletal:        General: No tenderness. Normal range of motion.     Cervical back: Normal range of motion and neck supple.     Right lower leg: No edema.     Left lower leg: No edema.     Comments: Patient has tenderness and redness of the foot with a small amount of bleeding between the toes, mild swelling of that foot, the patient does have some contractures of the left arm and leg, some left-sided facial droop, this is baseline.  Right side is normal  Lymphadenopathy:     Cervical: No cervical adenopathy.  Skin:    General: Skin is warm and dry.     Findings: Rash present. No erythema.     Comments: Scattered areas of small amounts of erythema, covered in urine and some feces  Neurological:      Mental Status: He is alert.     Coordination: Coordination normal.  Psychiatric:        Behavior: Behavior normal.     ED Results / Procedures / Treatments   Labs (all labs ordered are listed, but only abnormal results are displayed) Labs Reviewed  COMPREHENSIVE METABOLIC PANEL - Abnormal; Notable for the following components:      Result Value   Potassium 2.8 (*)    Creatinine, Ser 1.56 (*)    Calcium 8.3 (*)    Total Protein 6.1 (*)    Albumin 3.0 (*)    GFR, Estimated 45 (*)    All other components within normal limits  URINALYSIS, ROUTINE W REFLEX MICROSCOPIC - Abnormal; Notable for the following components:   APPearance HAZY (*)    Specific Gravity, Urine 1.003 (*)    Hgb urine dipstick LARGE (*)    Leukocytes,Ua LARGE (*)    Bacteria, UA RARE (*)    All other components within normal limits  I-STAT CHEM 8, ED - Abnormal; Notable for the following components:   Potassium 2.8 (*)    Creatinine, Ser 1.50 (*)    Calcium, Ion 1.06 (*)    All other components within normal limits  RESP PANEL BY RT-PCR (RSV, FLU A&B, COVID)  RVPGX2  CULTURE, BLOOD (ROUTINE X 2)  CULTURE, BLOOD (ROUTINE X 2)  URINE CULTURE  LACTIC ACID, PLASMA  CBC WITH DIFFERENTIAL/PLATELET  PROTIME-INR  APTT  LACTIC ACID, PLASMA    EKG EKG Interpretation  Date/Time:  Sunday October 23 2022 15:19:52 EDT Ventricular Rate:  122 PR Interval:    QRS Duration: 257 QT Interval:  392 QTC Calculation: 496 R Axis:   73 Text Interpretation: Undetermined rhythm Ventricular premature complex IVCD, consider atypical RBBB ST depr, consider ischemia, inferior leads Artifact in lead(s) I II III aVR aVL aVF V1 V2 V3 V4 V5 V6 Confirmed by Eber Hong ([id number]) on 10/23/2022 3:34:53 PM  Radiology DG Foot Complete Left  Result Date: 10/23/2022 CLINICAL DATA:  Redness and swelling.  Injury. EXAM: LEFT FOOT - COMPLETE 3+ VIEW COMPARISON:  Left foot x-ray 03/15/2018 FINDINGS: The bones are osteopenic. There is no  acute fracture or dislocation identified. No significant degenerative change. There is mild soft tissue swelling of the foot diffusely. IMPRESSION: 1. No acute fracture or dislocation. 2. Osteopenia. 3. Mild soft tissue swelling. Electronically Signed   By: Darliss Cheney M.D.   On: 10/23/2022  16:22   DG Chest Port 1 View  Result Date: 10/23/2022 CLINICAL DATA:  Questionable sepsis EXAM: PORTABLE CHEST 1 VIEW COMPARISON:  Chest x-ray dated March 15, 2018 FINDINGS: Cardiac and mediastinal contours are within normal limits. Shunt catheter tubing partially visualized coursing the right hemithorax. Mild bibasilar opacities. No evidence of pleural effusion or pneumothorax. IMPRESSION: Mild bibasilar opacities, likely atelectasis. Electronically Signed   By: Allegra Lai M.D.   On: 10/23/2022 16:19    Procedures .Critical Care  Performed by: Eber Hong, MD Authorized by: Eber Hong, MD   Critical care provider statement:    Critical care time (minutes):  45   Critical care time was exclusive of:  Separately billable procedures and treating other patients and teaching time   Critical care was time spent personally by me on the following activities:  Development of treatment plan with patient or surrogate, discussions with consultants, evaluation of patient's response to treatment, examination of patient, obtaining history from patient or surrogate, review of old charts, re-evaluation of patient's condition, pulse oximetry, ordering and review of radiographic studies, ordering and review of laboratory studies and ordering and performing treatments and interventions   I assumed direction of critical care for this patient from another provider in my specialty: no     Care discussed with: admitting provider   Comments:           Medications Ordered in ED Medications  lactated ringers infusion ( Intravenous New Bag/Given 10/23/22 1635)  sodium chloride 0.9 % bolus 1,000 mL (1,000 mLs  Intravenous New Bag/Given 10/23/22 1524)    And  sodium chloride 0.9 % bolus 1,000 mL (1,000 mLs Intravenous New Bag/Given 10/23/22 1637)    And  sodium chloride 0.9 % bolus 500 mL (has no administration in time range)  cefTRIAXone (ROCEPHIN) 2 g in sodium chloride 0.9 % 100 mL IVPB (0 g Intravenous Stopped 10/23/22 1634)  magnesium sulfate IVPB 2 g 50 mL (has no administration in time range)  potassium chloride 10 mEq in 100 mL IVPB (has no administration in time range)  acetaminophen (TYLENOL) tablet 1,000 mg (1,000 mg Oral Given 10/23/22 1640)    ED Course/ Medical Decision Making/ A&P                             Medical Decision Making Amount and/or Complexity of Data Reviewed Labs: ordered. Radiology: ordered. ECG/medicine tests: ordered.  Risk OTC drugs. Prescription drug management. Decision regarding hospitalization.    This patient presents to the ED for concern of weakness and hypotension, this involves an extensive number of treatment options, and is a complaint that carries with it a high risk of complications and morbidity.  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  The pertinent results include: Sepsis workup   Imaging Studies ordered:  I ordered imaging studies including chest x-ray I independently visualized and interpreted imaging which showed no findings of pneumonia I agree with the radiologist interpretation   Cardiac Monitoring: / EKG:  The patient was maintained on a cardiac monitor.  I personally  viewed and  interpreted the cardiac monitored which showed an underlying rhythm of: Sinus rhythm   Consultations Obtained:  I requested consultation with the [facility] to list,  and discussed lab and imaging findings as well as pertinent plan - they recommend: Admission   Problem List / ED Course / Critical interventions / Medication management  IV fluids were given for the patient's hypotension, initially in the 80s, gradually rose to the 90s and now is 107/58 but with significant fluid resuscitation. I ordered medication including Rocephin for UTI Reevaluation of the patient after these medicines showed that the patient in I have reviewed the patients home medicines and have made adjustments as needed   Social Determinants of Health:  Debilitated from stroke   Test / Admission - Considered:  Will admit to hospital, higher level of care, hypotension significantly improved with aggressive fluids         Final Clinical Impression(s) / ED Diagnoses Final diagnoses:  Acute cystitis without hematuria  Hypokalemia  Hypotension, unspecified hypotension type  Acute renal insufficiency    Rx / DC Orders ED Discharge Orders     None         Eber Hong, MD 10/23/22 1836

## 2022-10-23 NOTE — Progress Notes (Signed)
[name] [id number] Admitted to W01: 10/23/2022 9:23 PM Attending Provider: Lucile Shutters, MD    Joel Solis is a 77 y.o. male patient admitted from ED awake, alert  & orientated  X 3,  DNR, VSS - Blood pressure 106/64, pulse 76, temperature 98 F (36.7 C), temperature source Oral, resp. rate 19, height 5\' 9"  (1.753 m), weight 79.4 kg, SpO2 100 %.RA, no c/o shortness of breath, no c/o chest pain, no distress noted. Tele placed and pt is currently running: NSR  IV sites WDL: with transparent dsgs that are clean dry and intact.  Allergies:   Allergies  Allergen Reactions   Oyster Shell Diarrhea, Nausea And Vomiting and Other (See Comments)    GI Intolerance- Pt states he is allergic to the whole oyster, clams and mussels   Shellfish Allergy Diarrhea and Nausea And Vomiting     Past Medical History:  Diagnosis Date   Essential hypertension    Hemorrhagic stroke (HCC)    HLD (hyperlipidemia)    S/P VP shunt    Stroke (HCC)    Tobacco abuse     History:  obtained from the patient.  Pt orientation to unit, room and routine. Information packet given to patient and safety video refused at this time.  Admission INP armband ID verified with patient, and in place. SR up x 2, fall risk assessment complete with Patient verbalizing understanding of risks associated with falls. Pt verbalizes an understanding of how to use the call bell and to call for help before getting out of bed.  Skin, see assessment flowsheet.  Will cont to monitor and assist as needed.  Elisha Ponder, RN 10/23/2022 9:23 PM

## 2022-10-23 NOTE — Sepsis Progress Note (Addendum)
Elink following code sepsis  Confirmed with bedside RN first set of [facility] were drawn prior to abx started

## 2022-10-23 NOTE — Assessment & Plan Note (Signed)
Patient noted to have urinary retention and pyuria Continue empiric antibiotic therapy with Rocephin Follow-up results of urine culture

## 2022-10-23 NOTE — Assessment & Plan Note (Signed)
Patient with a history of a prior stroke with left-sided hemiparesis who presents to the ER for evaluation following a fall at home  He is not ambulatory and gets around his house using a power chair Will place patient on fall precautions Will request PT evaluation Obtain total CK levels

## 2022-10-24 ENCOUNTER — Other Ambulatory Visit (HOSPITAL_COMMUNITY): Payer: Self-pay

## 2022-10-24 DIAGNOSIS — W19XXXD Unspecified fall, subsequent encounter: Secondary | ICD-10-CM | POA: Diagnosis not present

## 2022-10-24 DIAGNOSIS — E876 Hypokalemia: Secondary | ICD-10-CM

## 2022-10-24 DIAGNOSIS — N289 Disorder of kidney and ureter, unspecified: Secondary | ICD-10-CM | POA: Diagnosis not present

## 2022-10-24 LAB — URINE CULTURE: Culture: >=100000 — AB

## 2022-10-24 LAB — BASIC METABOLIC PANEL
Anion gap: 8 (ref 5–15)
BUN: 17 mg/dL (ref 8–23)
CO2: 21 mmol/L — ABNORMAL LOW (ref 22–32)
Calcium: 7.5 mg/dL — ABNORMAL LOW (ref 8.9–10.3)
Chloride: 110 mmol/L (ref 98–111)
Creatinine, Ser: 1.58 mg/dL — ABNORMAL HIGH (ref 0.61–1.24)
GFR, Estimated: 45 mL/min — ABNORMAL LOW (ref >60)
Glucose, Bld: 89 mg/dL (ref 70–99)
Potassium: 3.3 mmol/L — ABNORMAL LOW (ref 3.5–5.1)
Sodium: 139 mmol/L (ref 135–145)

## 2022-10-24 LAB — CBC
HCT: 35.6 % — ABNORMAL LOW (ref 39.0–52.0)
Hemoglobin: 11.8 g/dL — ABNORMAL LOW (ref 13.0–17.0)
MCH: 29 pg (ref 26.0–34.0)
MCHC: 33.1 g/dL (ref 30.0–36.0)
MCV: 87.5 fL (ref 80.0–100.0)
Platelets: 148 10*3/uL — ABNORMAL LOW (ref 150–400)
RBC: 4.07 MIL/uL — ABNORMAL LOW (ref 4.22–5.81)
RDW: 13.3 % (ref 11.5–15.5)
WBC: 5.6 10*3/uL (ref 4.0–10.5)
nRBC: 0 % (ref 0.0–0.2)

## 2022-10-24 LAB — BRAIN NATRIURETIC PEPTIDE: B Natriuretic Peptide: 448 pg/mL — ABNORMAL HIGH (ref 0.0–100.0)

## 2022-10-24 LAB — CULTURE, BLOOD (ROUTINE X 2): Culture: NO GROWTH

## 2022-10-24 LAB — MAGNESIUM: Magnesium: 2.1 mg/dL (ref 1.7–2.4)

## 2022-10-24 LAB — C-REACTIVE PROTEIN: CRP: 1.5 mg/dL — ABNORMAL HIGH (ref <1.0)

## 2022-10-24 MED ORDER — LIDOCAINE HCL URETHRAL/MUCOSAL 2 % EX GEL
1.0000 | Freq: Once | CUTANEOUS | Status: DC
  Filled 2022-10-24: qty 6

## 2022-10-24 MED ORDER — TAMSULOSIN HCL 0.4 MG PO CAPS
0.4000 mg | ORAL_CAPSULE | Freq: Every day | ORAL | Status: DC
  Administered 2022-10-24 – 2022-10-26 (×3): 0.4 mg via ORAL
  Filled 2022-10-24 (×3): qty 1

## 2022-10-24 MED ORDER — POTASSIUM CHLORIDE CRYS ER 20 MEQ PO TBCR
40.0000 meq | EXTENDED_RELEASE_TABLET | Freq: Once | ORAL | Status: AC
  Administered 2022-10-24: 40 meq via ORAL
  Filled 2022-10-24: qty 2

## 2022-10-24 MED ORDER — CEPHALEXIN 500 MG PO CAPS
500.0000 mg | ORAL_CAPSULE | Freq: Three times a day (TID) | ORAL | 0 refills | Status: DC
  Filled 2022-10-24: qty 12, 4d supply, fill #0

## 2022-10-24 MED ORDER — LACTATED RINGERS IV BOLUS
500.0000 mL | Freq: Once | INTRAVENOUS | Status: AC
  Administered 2022-10-24: 500 mL via INTRAVENOUS

## 2022-10-24 MED ORDER — CARVEDILOL 6.25 MG PO TABS
6.2500 mg | ORAL_TABLET | Freq: Two times a day (BID) | ORAL | 0 refills | Status: DC
  Filled 2022-10-24: qty 60, 30d supply, fill #0

## 2022-10-24 MED ORDER — TAMSULOSIN HCL 0.4 MG PO CAPS
0.4000 mg | ORAL_CAPSULE | Freq: Every day | ORAL | 0 refills | Status: DC
  Filled 2022-10-24: qty 30, 30d supply, fill #0

## 2022-10-24 NOTE — Discharge Summary (Addendum)
After patient was discharged home upon his request, patient had conversations with his daughter and now wants to consider SNF placement.  PT and TOC informed.    Joel Solis ZOX:[id number] DOB: 09-Apr-1946 DOA: 10/23/2022  PCP: [facility]  Admit date: 10/23/2022  Discharge date: 10/26/2022  Admitted From: Home   Disposition:  Home, patient refused SNF this morning.   Recommendations for Outpatient Follow-up:   Follow up with PCP in 1-2 weeks  PCP Please obtain BMP/CBC, 2 view CXR in 1week,  (see Discharge instructions)   PCP Please follow up on the following pending results: Check CBC, BMP, magnesium in 7 to 10 days, needs outpatient urology follow-up for urinary retention. Please follow final urine culture results.   Home Health: as before   Equipment/Devices: None  Consultations: None  Discharge Condition: Stable    CODE STATUS: Full    Diet Recommendation: Heart Healthy     Chief Complaint  Patient presents with   Hypotension    weakness     Brief history of present illness from the day of admission and additional interim summary    77 y.o. male with medical history significant for hemorrhagic stroke with left-sided hemiparesis (uses a power chair and is nonambulatory), hypertension, dyslipidemia, peripheral arterial disease, nicotine dependence who presents to the emergency room via EMS for evaluation of weakness and a fall 1 day prior to his admission.  Patient lives alone and gets around his apartment in a power chair.  He has home health services 3 times a week.  He fell the day prior to his admission and states that he slid off the edge of the bed landing on the floor.  He was unable to get up and EMS was called.  They were able to pick him up off the floor but patient  declined transport to the emergency room.  On the day of his admission his daughter had called EMS due to progressive weakness and upon the arrival he was noted to be hypotensive with blood pressure 80/40, his workup was consistent with urinary retention, severe dehydration and he was admitted for further care.                                                                 Hospital Course   Severe dehydration, hypokalemia, hypomagnesemia and hypotension possible AKI.  Due to poor oral intake and patient being on Lasix, much improved, has been hydrated adequately, blood pressure is improved, electrolytes have been replaced, renal function plateaued at 1.5 could be his new baseline last creatinine we have is 4 years ago.  At this time he has declined going to an SNF, he already gets 3 times a week home PT which will be continued, message left for patient's daughter in detail this morning.  He is symptom-free and eager to go home.  Request PCP to monitor blood pressure, electrolytes, renal function and antihypertensive medication dosages closely.  Urinary retention and possible UTI. UC Ent.Fecalis with 1/4 +ve BC ( came after DC)  Placed on Flomax and amoxicillin, improved now, monitor bladder scans, he has been started on Flomax still has urinary retention, coud catheter placed, thereafter discharged home with catheter and on Flomax with outpatient urology follow-up.  Amoxicillin 1gm PO TID stop date 11/09/2022. Dw Dr Drue Second ID.  Hypertension.  Blood pressure stable medications adjusted upon discharge.  ACE inhibitor and diuretic held due to #1 above.  Twice daily.  Continue statin for secondary prevention.  Hemorrhagic CVA with chronic left-sided hemiparesis, wheelchair-bound.  Supportive care.   Discharge diagnosis     Principal Problem:   Fall Active Problems:   Hypokalemia   UTI (urinary tract infection)   Urinary retention   Essential hypertension   AKI (acute kidney injury) (HCC)    PAD (peripheral artery disease) (HCC)   CVA, old, hemiparesis (HCC)    Discharge instructions    Discharge Instructions     Ambulatory Referral for Lung Cancer Scre   Complete by: As directed    Diet - low sodium heart healthy   Complete by: As directed    Discharge instructions   Complete by: As directed    Follow with Primary MD [facility] in 7 days   Get CBC, CMP, Magnesium, UA with culture -  checked next visit with your primary MD   Activity: As tolerated with Full fall precautions use walker/cane & assistance as needed  Disposition Home   Diet: Heart Healthy    Special Instructions: If you have smoked or chewed Tobacco  in the last 2 yrs please stop smoking, stop any regular Alcohol  and or any Recreational drug use.  On your next visit with your primary care physician please Get Medicines reviewed and adjusted.  Please request your Prim.MD to go over all Hospital Tests and Procedure/Radiological results at the follow up, please get all Hospital records sent to your Prim MD by signing hospital release before you go home.  If you experience worsening of your admission symptoms, develop shortness of breath, life threatening emergency, suicidal or homicidal thoughts you must seek medical attention immediately by calling 911 or calling your MD immediately  if symptoms less severe.  You Must read complete instructions/literature along with all the possible adverse reactions/side effects for all the Medicines you take and that have been prescribed to you. Take any new Medicines after you have completely understood and accpet all the possible adverse reactions/side effects.   Increase activity slowly   Complete by: As directed        Discharge Medications   Allergies as of 10/26/2022       Reactions   Oyster Shell Diarrhea, Nausea And Vomiting, Other (See Comments)   GI Intolerance- Pt states he is allergic to the whole oyster, clams and mussels    Shellfish Allergy Diarrhea, Nausea And Vomiting        Medication List     STOP taking these medications    amLODipine 10 MG tablet Commonly known as: NORVASC   doxycycline 100 MG tablet Commonly known as: VIBRA-TABS   furosemide 20 MG tablet Commonly known as: LASIX   lisinopril 20 MG tablet Commonly known as: ZESTRIL   metoprolol tartrate 100 MG tablet Commonly known as: LOPRESSOR   potassium chloride 10 MEQ CR  capsule Commonly known as: Micro-K       TAKE these medications    amoxicillin 500 MG capsule Commonly known as: AMOXIL Take 2 capsules (1,000 mg total) by mouth 3 (three) times daily.   atorvastatin 40 MG tablet Commonly known as: LIPITOR Take 40 mg by mouth daily.   carvedilol 6.25 MG tablet Commonly known as: Coreg Take 1 tablet (6.25 mg total) by mouth 2 (two) times daily with a meal.   tamsulosin 0.4 MG Caps capsule Commonly known as: FLOMAX Take 1 capsule (0.4 mg total) by mouth daily.          Contact information for follow-up providers     [facility]. Schedule an appointment as soon as possible for a visit in 1 week(s).   Specialty: Family Medicine Contact information: [address] [place] [address] [phone]         Loletta Parish., MD. Schedule an appointment as soon as possible for a visit in 1 week(s).   Specialty: Urology Contact information: [address] [place] [address] [phone]              Contact information for after-discharge care     Destination     [facility] .   Service: Skilled Nursing Contact information: [address] [place] [address] [phone]                     Major procedures and Radiology Reports - PLEASE review detailed and final reports thoroughly  -      DG Foot Complete Left  Result Date: 10/23/2022 CLINICAL DATA:  Redness and swelling.  Injury. EXAM:  LEFT FOOT - COMPLETE 3+ VIEW COMPARISON:  Left foot x-ray 03/15/2018 FINDINGS: The bones are osteopenic. There is no acute fracture or dislocation identified. No significant degenerative change. There is mild soft tissue swelling of the foot diffusely. IMPRESSION: 1. No acute fracture or dislocation. 2. Osteopenia. 3. Mild soft tissue swelling. Electronically Signed   By: Darliss Cheney M.D.   On: 10/23/2022 16:22   DG Chest Port 1 View  Result Date: 10/23/2022 CLINICAL DATA:  Questionable sepsis EXAM: PORTABLE CHEST 1 VIEW COMPARISON:  Chest x-ray dated March 15, 2018 FINDINGS: Cardiac and mediastinal contours are within normal limits. Shunt catheter tubing partially visualized coursing the right hemithorax. Mild bibasilar opacities. No evidence of pleural effusion or pneumothorax. IMPRESSION: Mild bibasilar opacities, likely atelectasis. Electronically Signed   By: Allegra Lai M.D.   On: 10/23/2022 16:19    Micro Results    Recent Results (from the past 240 hour(s))  Blood Culture (routine x 2)     Status: None (Preliminary result)   Collection Time: 10/23/22  3:52 PM   Specimen: BLOOD  Result Value Ref Range Status   Specimen Description BLOOD SITE NOT SPECIFIED  Final   Special Requests   Final    BOTTLES DRAWN AEROBIC AND ANAEROBIC Blood Culture adequate volume   Culture   Final    NO GROWTH 3 DAYS Performed at [facility], [address], [place], [place] [address]    Report Status PENDING  Incomplete  Resp panel by RT-PCR (RSV, Flu A&B, Covid) Anterior Nasal Swab     Status: None   Collection Time: 10/23/22  4:31 PM   Specimen: Anterior Nasal Swab  Result Value Ref Range Status   SARS Coronavirus 2 by RT PCR NEGATIVE NEGATIVE  Final   Influenza A by PCR NEGATIVE NEGATIVE Final   Influenza B by PCR NEGATIVE NEGATIVE Final    Comment: (NOTE) The Xpert Xpress SARS-CoV-2/FLU/RSV plus assay is intended as an aid in the diagnosis of influenza from Nasopharyngeal swab  specimens and should not be used as a sole basis for treatment. Nasal washings and aspirates are unacceptable for Xpert Xpress SARS-CoV-2/FLU/RSV testing.  Fact Sheet for Patients: BloggerCourse.com  Fact Sheet for Healthcare Providers: SeriousBroker.it  This test is not yet approved or cleared by the [place] FDA and has been authorized for detection and/or diagnosis of SARS-CoV-2 by FDA under an Emergency Use Authorization (EUA). This EUA will remain in effect (meaning this test can be used) for the duration of the COVID-19 declaration under Section 564(b)(1) of the Act, 21 U.S.C. section 360bbb-3(b)(1), unless the authorization is terminated or revoked.     Resp Syncytial Virus by PCR NEGATIVE NEGATIVE Final    Comment: (NOTE) Fact Sheet for Patients: BloggerCourse.com  Fact Sheet for Healthcare Providers: SeriousBroker.it  This test is not yet approved or cleared by the [place] FDA and has been authorized for detection and/or diagnosis of SARS-CoV-2 by FDA under an Emergency Use Authorization (EUA). This EUA will remain in effect (meaning this test can be used) for the duration of the COVID-19 declaration under Section 564(b)(1) of the Act, 21 U.S.C. section 360bbb-3(b)(1), unless the authorization is terminated or revoked.  Performed at [facility], [address] [place], [place] [address]   Urine Culture (for pregnant, neutropenic or urologic patients or patients with an indwelling urinary catheter)     Status: Abnormal   Collection Time: 10/23/22  5:35 PM   Specimen: Urine, Clean Catch  Result Value Ref Range Status   Specimen Description URINE, CLEAN CATCH  Final   Special Requests   Final    ADDED 2048 Performed at [facility], [address] [place], [place] [address]    Culture >=100,000 COLONIES/mL ENTEROCOCCUS FAECALIS (A)  Final    Report Status 10/25/2022 FINAL  Final   Organism ID, Bacteria ENTEROCOCCUS FAECALIS (A)  Final      Susceptibility   Enterococcus faecalis - MIC*    AMPICILLIN <=2 SENSITIVE Sensitive     NITROFURANTOIN <=16 SENSITIVE Sensitive     VANCOMYCIN 1 SENSITIVE Sensitive     * >=100,000 COLONIES/mL ENTEROCOCCUS FAECALIS  Blood Culture (routine x 2)     Status: None (Preliminary result)   Collection Time: 10/23/22  7:51 PM   Specimen: BLOOD  Result Value Ref Range Status   Specimen Description BLOOD RIGHT ANTECUBITAL  Final   Special Requests   Final    BOTTLES DRAWN AEROBIC AND ANAEROBIC Blood Culture adequate volume   Culture  Setup Time   Final    GRAM POSITIVE COCCI IN CHAINS AEROBIC BOTTLE ONLY Organism ID to follow CRITICAL RESULT CALLED TO, READ BACK BY AND VERIFIED WITH: PHARMD ELIZABETH [id number] @1458  BY SM Performed at [facility], [address] [place], [place] [address]    Culture [facility] POSITIVE COCCI  Final   Report Status PENDING  Incomplete  Blood Culture ID Panel (Reflexed)     Status: Abnormal   Collection Time: 10/23/22  7:51 PM  Result Value Ref Range Status   Enterococcus faecalis DETECTED (A) NOT DETECTED Final    Comment: RBV PHARMD ELIZABETH [id number] 1458 BY SM   Enterococcus Faecium NOT DETECTED NOT DETECTED Final   Listeria monocytogenes NOT DETECTED  NOT DETECTED Final   Staphylococcus species NOT DETECTED NOT DETECTED Final   Staphylococcus aureus (BCID) NOT DETECTED NOT DETECTED Final   Staphylococcus epidermidis NOT DETECTED NOT DETECTED Final   Staphylococcus lugdunensis NOT DETECTED NOT DETECTED Final   Streptococcus species NOT DETECTED NOT DETECTED Final   Streptococcus agalactiae NOT DETECTED NOT DETECTED Final   Streptococcus pneumoniae NOT DETECTED NOT DETECTED Final   Streptococcus pyogenes NOT DETECTED NOT DETECTED Final   A.calcoaceticus-baumannii NOT DETECTED NOT DETECTED Final   Bacteroides fragilis NOT DETECTED NOT DETECTED Final    Enterobacterales NOT DETECTED NOT DETECTED Final   Enterobacter cloacae complex NOT DETECTED NOT DETECTED Final   Escherichia coli NOT DETECTED NOT DETECTED Final   Klebsiella aerogenes NOT DETECTED NOT DETECTED Final   Klebsiella oxytoca NOT DETECTED NOT DETECTED Final   Klebsiella pneumoniae NOT DETECTED NOT DETECTED Final   Proteus species NOT DETECTED NOT DETECTED Final   Salmonella species NOT DETECTED NOT DETECTED Final   Serratia marcescens NOT DETECTED NOT DETECTED Final   Haemophilus influenzae NOT DETECTED NOT DETECTED Final   Neisseria meningitidis NOT DETECTED NOT DETECTED Final   Pseudomonas aeruginosa NOT DETECTED NOT DETECTED Final   Stenotrophomonas maltophilia NOT DETECTED NOT DETECTED Final   Candida albicans NOT DETECTED NOT DETECTED Final   Candida auris NOT DETECTED NOT DETECTED Final   Candida glabrata NOT DETECTED NOT DETECTED Final   Candida krusei NOT DETECTED NOT DETECTED Final   Candida parapsilosis NOT DETECTED NOT DETECTED Final   Candida tropicalis NOT DETECTED NOT DETECTED Final   Cryptococcus neoformans/gattii NOT DETECTED NOT DETECTED Final   Vancomycin resistance NOT DETECTED NOT DETECTED Final    Comment: Performed at [facility], [address], [place], [place] [address]    Today   Subjective    Zarius Lulay today has no headache,no chest abdominal pain,no new weakness tingling or numbness, feels much better wants to go home today.    Objective   Blood pressure 115/63, pulse 71, temperature 97.7 F (36.5 C), resp. rate 17, height 5\' 9"  (1.753 m), weight 68.1 kg, SpO2 94 %.   Intake/Output Summary (Last 24 hours) at 10/26/2022 1532 Last data filed at 10/26/2022 0440 Gross per 24 hour  Intake 240 ml  Output 1100 ml  Net -860 ml    Exam  Awake Alert, No new F.N deficits, chr. left-sided hemiparesis, Coude catheter in place    [place].AT,PERRAL Supple Neck,   Symmetrical Chest wall movement, Good air movement bilaterally, CTAB RRR,No  Gallops,   +ve B.Sounds, Abd Soft, Non tender,  No Cyanosis, Clubbing or edema    Data Review   Recent Labs  Lab 10/23/22 1552 10/23/22 1638 10/24/22 0530 10/25/22 0439  WBC 7.3  --  5.6 3.6*  HGB 13.9 13.9 11.8* 12.6*  HCT 42.1 41.0 35.6* 38.6*  PLT 222  --  148* 145*  MCV 89.6  --  87.5 88.9  MCH 29.6  --  29.0 29.0  MCHC 33.0  --  33.1 32.6  RDW 13.1  --  13.3 13.4  LYMPHSABS 2.4  --   --  0.8  MONOABS 0.6  --   --  0.3  EOSABS 0.1  --   --  0.0  BASOSABS 0.1  --   --  0.0    Recent Labs  Lab 10/23/22 1552 10/23/22 1638 10/23/22 1951 10/24/22 0530 10/24/22 0542 10/25/22 0439  NA 135 136  --  139  --  137  K 2.8* 2.8*  --  3.3*  --  3.6  CL 99 99  --  110  --  104  CO2 22  --   --  21*  --  22  ANIONGAP 14  --   --  8  --  11  GLUCOSE 80 75  --  89  --  139*  BUN 20 21  --  17  --  14  CREATININE 1.56* 1.50*  --  1.58*  --  1.39*  AST 23  --   --   --   --   --   ALT 12  --   --   --   --   --   ALKPHOS 105  --   --   --   --   --   BILITOT 1.1  --   --   --   --   --   ALBUMIN 3.0*  --   --   --   --   --   CRP  --   --   --  1.5*  --   --   LATICACIDVEN 1.7  --  1.2  --   --   --   INR 1.1  --   --   --   --   --   BNP  --   --   --   --  448.0* 419.6*  MG  --   --  1.6* 2.1  --  1.9  CALCIUM 8.3*  --   --  7.5*  --  7.8*    Total Time in preparing paper work, data evaluation and todays exam - 35 minutes  Signature  -    Susa Raring M.D on 10/26/2022 at 3:32 PM   -  To page go to [email or web address]

## 2022-10-24 NOTE — Consult Note (Signed)
Urology Consult  Referring physician: Dr. Thedore Mins Reason for referral: Foley catheter placement  Chief Complaint: As above  History of Present Illness:  Joel Solis is a 77 year old male presenting to [facility] via EMS after falling out of his bed and onto the floor, he was unable to get up and requires a power chair at baseline.  PMH significant for hemorrhagic stroke with left-sided hemiparesis, HTN, PVD, nicotine dependence, and urinary retention.  [facility] was consulted to assist with difficult Foley catheter placement.  Foley catheter was present on arrival draining clear yellow urine.  Past Medical History:  Diagnosis Date   Essential hypertension    Hemorrhagic stroke (HCC)    HLD (hyperlipidemia)    S/P VP shunt    Stroke (HCC)    Tobacco abuse    Past Surgical History:  Procedure Laterality Date   cyst      cyst in neck   VENTRICULOPERITONEAL SHUNT      Medications: I have reviewed the patient's current medications. Allergies:  Allergies  Allergen Reactions   Oyster Shell Diarrhea, Nausea And Vomiting and Other (See Comments)    GI Intolerance- Pt states he is allergic to the whole oyster, clams and mussels   Shellfish Allergy Diarrhea and Nausea And Vomiting    Family History  Problem Relation Age of Onset   Intracerebral hemorrhage Mother    Lung cancer Father    Esophageal cancer Brother    Social History:  reports that he has been smoking cigarettes. He has a 50.00 pack-year smoking history. He has never used smokeless tobacco. He reports that he does not currently use alcohol. He reports that he does not use drugs.  ROS: All systems are reviewed and negative except as noted.  Urinary urgency Mild incontinence Incomplete emptying Difficulty starting stream Nocturia  Physical Exam:  Vital signs in last 24 hours: Temp:  [97.7 F (36.5 C)-98.6 F (37 C)] 98.5 F (36.9 C) (06/03 1126) Pulse Rate:  [58-81] 74 (06/03 1126) Resp:   [12-22] 17 (06/03 1126) BP: (93-129)/(53-94) 112/77 (06/03 1126) SpO2:  [96 %-100 %] 100 % (06/03 1126) Weight:  [68.1 kg-79.4 kg] 68.1 kg (06/02 2045)  Cardiovascular: Skin warm; not flushed Respiratory: Breaths quiet; no shortness of breath Abdomen: No masses, soft, no guarding or rebound Neurological: Normal sensation to touch Musculoskeletal: Normal motor function arms and legs Skin: No rashes Genitourinary: Foley catheter in place draining clear yellow urine  Laboratory Data:  Results for orders placed or performed during the hospital encounter of 10/23/22 (from the past 72 hour(s))  Lactic acid, plasma     Status: None   Collection Time: 10/23/22  3:52 PM  Result Value Ref Range   Lactic Acid, Venous 1.7 0.5 - 1.9 mmol/L    Comment: Performed at [facility], [address], [place], [place] [address]  Comprehensive metabolic panel     Status: Abnormal   Collection Time: 10/23/22  3:52 PM  Result Value Ref Range   Sodium 135 135 - 145 mmol/L   Potassium 2.8 (L) 3.5 - 5.1 mmol/L   Chloride 99 98 - 111 mmol/L   CO2 22 22 - 32 mmol/L   Glucose, Bld 80 70 - 99 mg/dL    Comment: Glucose reference range applies only to samples taken after fasting for at least 8 hours.   BUN 20 8 - 23 mg/dL   Creatinine, Ser 6.04 (H) 0.61 - 1.24 mg/dL   Calcium 8.3 (L) 8.9 - 10.3  mg/dL   Total Protein 6.1 (L) 6.5 - 8.1 g/dL   Albumin 3.0 (L) 3.5 - 5.0 g/dL   AST 23 15 - 41 U/L   ALT 12 0 - 44 U/L   Alkaline Phosphatase 105 38 - 126 U/L   Total Bilirubin 1.1 0.3 - 1.2 mg/dL   GFR, Estimated 45 (L) >60 mL/min    Comment: (NOTE) Calculated using the CKD-EPI Creatinine Equation (2021)    Anion gap 14 5 - 15    Comment: Performed at [facility], [address], [place], [place] [address]  CBC with Differential     Status: None   Collection Time: 10/23/22  3:52 PM  Result Value Ref Range   WBC 7.3 4.0 - 10.5 K/uL   RBC 4.70 4.22 - 5.81 MIL/uL   Hemoglobin 13.9 13.0 - 17.0 g/dL    HCT 19.1 47.8 - 29.5 %   MCV 89.6 80.0 - 100.0 fL   MCH 29.6 26.0 - 34.0 pg   MCHC 33.0 30.0 - 36.0 g/dL   RDW 62.1 30.8 - 65.7 %   Platelets 222 150 - 400 K/uL   nRBC 0.0 0.0 - 0.2 %   Neutrophils Relative % 56 %   Neutro Abs 4.1 1.7 - 7.7 K/uL   Lymphocytes Relative 33 %   Lymphs Abs 2.4 0.7 - 4.0 K/uL   Monocytes Relative 9 %   Monocytes Absolute 0.6 0.1 - 1.0 K/uL   Eosinophils Relative 1 %   Eosinophils Absolute 0.1 0.0 - 0.5 K/uL   Basophils Relative 1 %   Basophils Absolute 0.1 0.0 - 0.1 K/uL   Immature Granulocytes 0 %   Abs Immature Granulocytes 0.03 0.00 - 0.07 K/uL    Comment: Performed at [facility], [address], [place], [place] [address]  Protime-INR     Status: None   Collection Time: 10/23/22  3:52 PM  Result Value Ref Range   Prothrombin Time 14.6 11.4 - 15.2 seconds   INR 1.1 0.8 - 1.2    Comment: (NOTE) INR goal varies based on device and disease states. Performed at [facility], [address] [place], [place] [address]   APTT     Status: None   Collection Time: 10/23/22  3:52 PM  Result Value Ref Range   aPTT 36 24 - 36 seconds    Comment: Performed at [facility], [address], [place], [place] [address]  Blood Culture (routine x 2)     Status: None (Preliminary result)   Collection Time: 10/23/22  3:52 PM   Specimen: BLOOD  Result Value Ref Range   Specimen Description BLOOD SITE NOT SPECIFIED    Special Requests      BOTTLES DRAWN AEROBIC AND ANAEROBIC Blood Culture adequate volume   Culture      NO GROWTH < 24 HOURS Performed at [facility], [address], [place], [place] [address]    Report Status PENDING   Resp panel by RT-PCR (RSV, Flu A&B, Covid) Anterior Nasal Swab     Status: None   Collection Time: 10/23/22  4:31 PM   Specimen: Anterior Nasal Swab  Result Value Ref Range   SARS Coronavirus 2 by RT PCR NEGATIVE NEGATIVE   Influenza A by PCR NEGATIVE NEGATIVE   Influenza B by PCR NEGATIVE  NEGATIVE    Comment: (NOTE) The Xpert Xpress SARS-CoV-2/FLU/RSV plus assay is intended as an aid in the diagnosis of influenza  from Nasopharyngeal swab specimens and should not be used as a sole basis for treatment. Nasal washings and aspirates are unacceptable for Xpert Xpress SARS-CoV-2/FLU/RSV testing.  Fact Sheet for Patients: BloggerCourse.com  Fact Sheet for Healthcare Providers: SeriousBroker.it  This test is not yet approved or cleared by the [place] FDA and has been authorized for detection and/or diagnosis of SARS-CoV-2 by FDA under an Emergency Use Authorization (EUA). This EUA will remain in effect (meaning this test can be used) for the duration of the COVID-19 declaration under Section 564(b)(1) of the Act, 21 U.S.C. section 360bbb-3(b)(1), unless the authorization is terminated or revoked.     Resp Syncytial Virus by PCR NEGATIVE NEGATIVE    Comment: (NOTE) Fact Sheet for Patients: BloggerCourse.com  Fact Sheet for Healthcare Providers: SeriousBroker.it  This test is not yet approved or cleared by the [place] FDA and has been authorized for detection and/or diagnosis of SARS-CoV-2 by FDA under an Emergency Use Authorization (EUA). This EUA will remain in effect (meaning this test can be used) for the duration of the COVID-19 declaration under Section 564(b)(1) of the Act, 21 U.S.C. section 360bbb-3(b)(1), unless the authorization is terminated or revoked.  Performed at [facility], [address], [place], [place] [address]   I-Stat Chem 8, ED     Status: Abnormal   Collection Time: 10/23/22  4:38 PM  Result Value Ref Range   Sodium 136 135 - 145 mmol/L   Potassium 2.8 (L) 3.5 - 5.1 mmol/L   Chloride 99 98 - 111 mmol/L   BUN 21 8 - 23 mg/dL   Creatinine, Ser 6.04 (H) 0.61 - 1.24 mg/dL   Glucose, Bld 75 70 - 99 mg/dL    Comment: Glucose  reference range applies only to samples taken after fasting for at least 8 hours.   Calcium, Ion 1.06 (L) 1.15 - 1.40 mmol/L   TCO2 23 22 - 32 mmol/L   Hemoglobin 13.9 13.0 - 17.0 g/dL   HCT 54.0 98.1 - 19.1 %  Urinalysis, Routine w reflex microscopic -Urine, Clean Catch     Status: Abnormal   Collection Time: 10/23/22  5:35 PM  Result Value Ref Range   Color, Urine YELLOW YELLOW   APPearance HAZY (A) CLEAR   Specific Gravity, Urine 1.003 (L) 1.005 - 1.030   pH 7.0 5.0 - 8.0   Glucose, UA NEGATIVE NEGATIVE mg/dL   Hgb urine dipstick LARGE (A) NEGATIVE   Bilirubin Urine NEGATIVE NEGATIVE   Ketones, ur NEGATIVE NEGATIVE mg/dL   Protein, ur NEGATIVE NEGATIVE mg/dL   Nitrite NEGATIVE NEGATIVE   Leukocytes,Ua LARGE (A) NEGATIVE   RBC / HPF 6-10 0 - 5 RBC/hpf   WBC, UA 21-50 0 - 5 WBC/hpf   Bacteria, UA RARE (A) NONE SEEN   Squamous Epithelial / HPF 0-5 0 - 5 /HPF   Mucus PRESENT    Hyaline Casts, UA PRESENT     Comment: Performed at [facility], [address], [place], [place] [address]  Lactic acid, plasma     Status: None   Collection Time: 10/23/22  7:51 PM  Result Value Ref Range   Lactic Acid, Venous 1.2 0.5 - 1.9 mmol/L    Comment: Performed at [facility], [address] [place], [place] [address]  Blood Culture (routine x 2)     Status: None (Preliminary result)   Collection Time: 10/23/22  7:51 PM   Specimen: BLOOD  Result Value Ref Range  Specimen Description BLOOD RIGHT ANTECUBITAL    Special Requests      BOTTLES DRAWN AEROBIC AND ANAEROBIC Blood Culture adequate volume   Culture      NO GROWTH < 12 HOURS Performed at [facility], [address], [place], [place] [address]    Report Status PENDING   CK total and CKMB (cardiac)not at [facility]     Status: Abnormal   Collection Time: 10/23/22  7:51 PM  Result Value Ref Range   Total CK 529 (H) 49 - 397 U/L   CK, MB 14.5 (H) 0.5 - 5.0 ng/mL    Comment: Performed at [facility], [address] [place], [place] [address]  Magnesium     Status: Abnormal   Collection Time: 10/23/22  7:51 PM  Result Value Ref Range   Magnesium 1.6 (L) 1.7 - 2.4 mg/dL    Comment: Performed at [facility], [address], [place], [place] [address]  CBC     Status: Abnormal   Collection Time: 10/24/22  5:30 AM  Result Value Ref Range   WBC 5.6 4.0 - 10.5 K/uL   RBC 4.07 (L) 4.22 - 5.81 MIL/uL   Hemoglobin 11.8 (L) 13.0 - 17.0 g/dL   HCT 91.4 (L) 78.2 - 95.6 %   MCV 87.5 80.0 - 100.0 fL   MCH 29.0 26.0 - 34.0 pg   MCHC 33.1 30.0 - 36.0 g/dL   RDW 21.3 08.6 - 57.8 %   Platelets 148 (L) 150 - 400 K/uL   nRBC 0.0 0.0 - 0.2 %    Comment: Performed at [facility], [address] [place], [place] [address]  Basic metabolic panel     Status: Abnormal   Collection Time: 10/24/22  5:30 AM  Result Value Ref Range   Sodium 139 135 - 145 mmol/L   Potassium 3.3 (L) 3.5 - 5.1 mmol/L   Chloride 110 98 - 111 mmol/L   CO2 21 (L) 22 - 32 mmol/L   Glucose, Bld 89 70 - 99 mg/dL    Comment: Glucose reference range applies only to samples taken after fasting for at least 8 hours.   BUN 17 8 - 23 mg/dL   Creatinine, Ser 9.52 (H) 0.61 - 1.24 mg/dL   Calcium 7.5 (L) 8.9 - 10.3 mg/dL   GFR, Estimated 45 (L) >60 mL/min    Comment: (NOTE) Calculated using the CKD-EPI Creatinine Equation (2021)    Anion gap 8 5 - 15    Comment: Performed at [facility], [address] [place], [place] [address]  Magnesium     Status: None   Collection Time: 10/24/22  5:30 AM  Result Value Ref Range   Magnesium 2.1 1.7 - 2.4 mg/dL    Comment: Performed at [facility], [address] [place], [place] [address]  C-reactive protein     Status: Abnormal   Collection Time: 10/24/22  5:30 AM  Result Value Ref Range   CRP 1.5 (H) <1.0 mg/dL    Comment: Performed at [facility], [address], [place], [place] [address]  Brain natriuretic peptide     Status: Abnormal   Collection Time:  10/24/22  5:42 AM  Result Value Ref Range   B Natriuretic Peptide 448.0 (H) 0.0 - 100.0 pg/mL    Comment: Performed at [facility], [address] [place], [place] [address]   Recent Results (from the past  240 hour(s))  Blood Culture (routine x 2)     Status: None (Preliminary result)   Collection Time: 10/23/22  3:52 PM   Specimen: BLOOD  Result Value Ref Range Status   Specimen Description BLOOD SITE NOT SPECIFIED  Final   Special Requests   Final    BOTTLES DRAWN AEROBIC AND ANAEROBIC Blood Culture adequate volume   Culture   Final    NO GROWTH < 24 HOURS Performed at [facility], [address], [place], [place] [address]    Report Status PENDING  Incomplete  Resp panel by RT-PCR (RSV, Flu A&B, Covid) Anterior Nasal Swab     Status: None   Collection Time: 10/23/22  4:31 PM   Specimen: Anterior Nasal Swab  Result Value Ref Range Status   SARS Coronavirus 2 by RT PCR NEGATIVE NEGATIVE Final   Influenza A by PCR NEGATIVE NEGATIVE Final   Influenza B by PCR NEGATIVE NEGATIVE Final    Comment: (NOTE) The Xpert Xpress SARS-CoV-2/FLU/RSV plus assay is intended as an aid in the diagnosis of influenza from Nasopharyngeal swab specimens and should not be used as a sole basis for treatment. Nasal washings and aspirates are unacceptable for Xpert Xpress SARS-CoV-2/FLU/RSV testing.  Fact Sheet for Patients: BloggerCourse.com  Fact Sheet for Healthcare Providers: SeriousBroker.it  This test is not yet approved or cleared by the [place] FDA and has been authorized for detection and/or diagnosis of SARS-CoV-2 by FDA under an Emergency Use Authorization (EUA). This EUA will remain in effect (meaning this test can be used) for the duration of the COVID-19 declaration under Section 564(b)(1) of the Act, 21 U.S.C. section 360bbb-3(b)(1), unless the authorization is terminated or revoked.     Resp Syncytial Virus by  PCR NEGATIVE NEGATIVE Final    Comment: (NOTE) Fact Sheet for Patients: BloggerCourse.com  Fact Sheet for Healthcare Providers: SeriousBroker.it  This test is not yet approved or cleared by the [place] FDA and has been authorized for detection and/or diagnosis of SARS-CoV-2 by FDA under an Emergency Use Authorization (EUA). This EUA will remain in effect (meaning this test can be used) for the duration of the COVID-19 declaration under Section 564(b)(1) of the Act, 21 U.S.C. section 360bbb-3(b)(1), unless the authorization is terminated or revoked.  Performed at [facility], [address], [place], [place] [address]   Blood Culture (routine x 2)     Status: None (Preliminary result)   Collection Time: 10/23/22  7:51 PM   Specimen: BLOOD  Result Value Ref Range Status   Specimen Description BLOOD RIGHT ANTECUBITAL  Final   Special Requests   Final    BOTTLES DRAWN AEROBIC AND ANAEROBIC Blood Culture adequate volume   Culture   Final    NO GROWTH < 12 HOURS Performed at [facility], [address] [place], [place] [address]    Report Status PENDING  Incomplete   Creatinine: Recent Labs    10/23/22 1552 10/23/22 1638 10/24/22 0530  CREATININE 1.56* 1.50* 1.58*    Imaging:  No imaging available  Assessment/Plan:  Consulted for difficult Foley placement and patient retaining around 300cc.  Functional capacity of the bladder is closer to 500cc and 300cc of retention would not necessitate long-term Foley placement in most cases.  Patient has no urologic history or workup but has complaint of difficult stream, incomplete emptying, mild incontinence, and urinary urgency.  The majority of the Foley catheter that was already placed on my arrival is  taken up suggesting a large prostate size, though no imaging has been collected.  On top of the BPH there may be a neurogenic component following his stroke.   Recommend starting on Flomax and following up in clinic in 1-2 weeks for trial of void and further workup.   Scherrie Bateman Altheia Shafran 10/24/2022, 12:49 PM  Pager: [phone]

## 2022-10-24 NOTE — Evaluation (Signed)
Physical Therapy Evaluation Patient Details Name: Joel Solis MRN: [id number] DOB: [birth year] Today's Date: 10/24/2022  History of Present Illness  77 y.o. male with medical history significant for hemorrhagic stroke with left-sided hemiparesis (uses a power chair and is nonambulatory), hypertension, dyslipidemia, peripheral arterial disease, nicotine dependence who presents to the emergency room via EMS for evaluation of weakness and a fall 1 day prior to his admission.   Clinical Impression  Patient received in bed sleeping with blanket over head. He is agreeable to PT assessment.  Patient reports he lives alone, has caregiver who assists him. He just moved to new apartment. Patient required mod assist and heavy use of bed rail to perform supine to sit. Able to sit and scoot with single UE assist. Patient unable to stand from low bed with single UE assist. He could stand once bed was moderately elevated. Patient will continue to benefit from skilled PT to improve strength, safety and functional independence.          Recommendations for follow up therapy are one component of a multi-disciplinary discharge planning process, led by the attending physician.  Recommendations may be updated based on patient status, additional functional criteria and insurance authorization.  Follow Up Recommendations Can patient physically be transported by private vehicle: No     Assistance Recommended at Discharge Frequent or constant Supervision/Assistance  Patient can return home with the following  A lot of help with walking and/or transfers;A lot of help with bathing/dressing/bathroom;Assistance with feeding;Direct supervision/assist for medications management    Equipment Recommendations None recommended by PT  Recommendations for Other Services       Functional Status Assessment Patient has had a recent decline in their functional status and demonstrates the ability to make significant improvements  in function in a reasonable and predictable amount of time.     Precautions / Restrictions Precautions Precautions: Fall Restrictions Weight Bearing Restrictions: No      Mobility  Bed Mobility Overal bed mobility: Needs Assistance Bed Mobility: Supine to Sit, Sit to Supine     Supine to sit: Mod assist Sit to supine: Min assist   General bed mobility comments: increased time and effort needed with heavy use of bed rail.    Transfers Overall transfer level: Needs assistance Equipment used: Ambulation equipment used Transfers: Sit to/from Stand Sit to Stand: Supervision           General transfer comment: patient was able to stand using rail on stedy with bed moderately elevated. he was unable to clear bottom at all from low bed.    Ambulation/Gait               General Gait Details: patient is non-ambulatory at baseline  Stairs            Wheelchair Mobility    Modified Rankin (Stroke Patients Only)       Balance Overall balance assessment: Needs assistance Sitting-balance support: Feet supported, Single extremity supported Sitting balance-Leahy Scale: Fair     Standing balance support: Single extremity supported, During functional activity, Reliant on assistive device for balance Standing balance-Leahy Scale: Poor                               Pertinent Vitals/Pain Pain Assessment Pain Assessment: Faces Faces Pain Scale: Hurts little more Pain Location: L arm with mobility Pain Descriptors / Indicators: Discomfort, Sore Pain Intervention(s): Monitored during session, Repositioned    Home Living  Family/patient expects to be discharged to:: Skilled nursing facility Living Arrangements: Alone Available Help at Discharge: Personal care attendant Type of Home: Apartment Home Access: Level entry       Home Layout: One level Home Equipment: Hospital bed Additional Comments: transfer pole in bedroom    Prior Function  Prior Level of Function : Needs assist             Mobility Comments: has caregiver 3 x per week- per daughter patient has been having trouble getting up for the last 3 weeks and has mostly remained in bed. ADLs Comments: uses bedpan for bathroom and has assistance for washups from caregiver     Hand Dominance        Extremity/Trunk Assessment   Upper Extremity Assessment Upper Extremity Assessment: Defer to OT evaluation    Lower Extremity Assessment Lower Extremity Assessment: Generalized weakness;LLE deficits/detail LLE Deficits / Details: left side is contracted and not useful for mobility. L LE he keeps crossed over his right. LLE Coordination: decreased gross motor    Cervical / Trunk Assessment Cervical / Trunk Assessment: Normal  Communication   Communication: No difficulties  Cognition Arousal/Alertness: Awake/alert Behavior During Therapy: WFL for tasks assessed/performed Overall Cognitive Status: Within Functional Limits for tasks assessed                                 General Comments: poor insight to limitations        General Comments      Exercises     Assessment/Plan    PT Assessment Patient needs continued PT services  PT Problem List Decreased strength;Decreased mobility;Decreased balance       PT Treatment Interventions DME instruction;Functional mobility training;Therapeutic activities;Patient/family education    PT Goals (Current goals can be found in the Care Plan section)  Acute Rehab PT Goals Patient Stated Goal: to get additional rehab then go home. PT Goal Formulation: With patient/family Time For Goal Achievement: 11/07/22 Potential to Achieve Goals: Fair    Frequency Min 3X/week     Co-evaluation               AM-PAC PT "6 Clicks" Mobility  Outcome Measure Help needed turning from your back to your side while in a flat bed without using bedrails?: A Lot Help needed moving from lying on your back  to sitting on the side of a flat bed without using bedrails?: A Lot Help needed moving to and from a bed to a chair (including a wheelchair)?: A Lot Help needed standing up from a chair using your arms (e.g., wheelchair or bedside chair)?: A Little Help needed to walk in hospital room?: Total Help needed climbing 3-5 steps with a railing? : Total 6 Click Score: 11    End of Session   Activity Tolerance: Patient limited by fatigue Patient left: in bed;with call bell/phone within reach;with family/visitor present Nurse Communication: Mobility status PT Visit Diagnosis: Other abnormalities of gait and mobility (R26.89);Muscle weakness (generalized) (M62.81);History of falling (Z91.81)    Time: 1610-9604 PT Time Calculation (min) (ACUTE ONLY): 23 min   Charges:   PT Evaluation $PT Eval Moderate Complexity: 1 Mod          Cleotis Sparr, PT, GCS 10/24/22,10:59 AM

## 2022-10-24 NOTE — TOC Initial Note (Addendum)
Transition of Care [facility]) - Initial/Assessment Note    Patient Details  Name: Joel Solis MRN: [id number] Date of Birth: 1945/09/04  Transition of Care [facility]) CM/SW Contact:    Mearl Latin, LCSW Phone Number: 10/24/2022, 1:59 PM  Clinical Narrative:                 11:47-Patient sleeping. CSW spoke with his daughter, [name]. She expressed understanding of PT recommendation and is agreeable to SNF placement at time of discharge. CSW discussed insurance authorization process and will provide Medicare SNF ratings list. CSW will send out referrals for review and provide bed offers as available.   2pm-CSW confirmed with patient's daughter that [facility] is his primary insurance. CSW sent referral to [address], [facility], [facility], [facility] as those are the only SNFs in network.   3:36p-CSW presented SNF bed offers to [place] (denials from [place], [place], [place]). She will research and call CSW back.   3:50pm-Daughter called CSW back and has selected [facility] in [place]. CSW requested Matt start the insurance process.  Skilled Nursing Rehab Facilities-   [email or web address]   Ratings out of 5 stars (5 the highest)   Name Address  Phone # Quality Care Staffing Health Inspection Overall  [facility] [address] [place] [phone] 2 1 5 4   [facility] [address] [place] [phone] 4 1 3 2   [facility] [address], [place]inette Otto [phone] [facility] [address], [place] [phone] 4 1 3 2   [facility]  [address], [place] [phone] 3 2 5 5   [facility] [address] [facility] [phone] 2 1 2 1   [facility] [address], [place] [phone] 4 1 2 1   [facility] [address], [place] [phone] 2 4 3 3   [address] ([place]) [address], [place] [phone] 3 2 2 2   [facility] [address] [place], [place] [phone] 1 2 1 1   [facility] ([place]) [address], [place] [phone] 3 1 1 1   [facility] [address] Liliane Shi [phone] 4 3 4 4   [facility] [address] [place] [phone] 3 4 3 3           [facility] [address], [place] [phone]      [facility], [address] [place] [address], [place] [phone] 1 1 2 1   [facility] [address] [place] [phone] 2 2 4 4   [facility] [place] [address] Cheree Ditto [phone] 2 1 4 3   [facility] [address] [place] [phone] 3 3 3 3           [address] (no [facility]) [address], [place] [phone] 4 4 5 5   [facility] (No [employer]) [address] Lavera Guise [phone] 2 2 4 4   [facility] [address] [place] [phone] 2 1 2 1   [facility] (No [employer]) [address], [place] [phone] 5 5 5 5   [facility] [address] [place] [phone] 2 3 5 5   [facility] [address], [place] [phone] 2 1 1 1   [place] [address] Liliane Shi [phone] 5 2 5 5   [facility]  [address] [place] [phone] 2 2 2 2   [facility] [address], [place] [phone] 4 2 1 1   [facility] [address], [place] [phone] 2 2 3 3           [facility] [address] [place] [phone] 1 1 1 1   [facility] [address] Evlyn Clines  [phone] 2 3 3 3   [facility] (No Humana) [address] [place], [place] [phone] 2 1 3 2   [facility]) [address], [place]al[place] Levan [phone] 1 1 1 1   [facility] [address] [place] Levan [phone] 3 2 5 5   [facility] [address], [place] [phone] 2 1 1 1           [facility] [address] [place], [place] [phone] 4 4 5 5   [facility])  [address]  [place] [phone] 2 1 2 1   [facility]) [address] [place], [place] [phone]  1 4 3   [facility] [address] [place] [phone] 3 5 4 5   [address] [place], [place] [phone] 3 2 2 2   [facility]) [address] [place] [phone] 2 1 3 2      Expected Discharge Plan: Skilled Nursing Facility Barriers to Discharge: Continued Medical Work up, Insurance Authorization   Patient Goals and CMS Choice Patient states their goals for this hospitalization and ongoing recovery are:: Rehab CMS Medicare.gov Compare Post Acute Care list provided to:: Patient Choice offered to / list presented to : Patient, Adult Children Stephen ownership interest in [facility].provided to:: Adult Children    Expected Discharge Plan and Services In-house Referral: Clinical Social Work   Post Acute Care Choice: Skilled Nursing Facility Living arrangements for the past 2 months: Single Family Home Expected Discharge Date: 10/24/22                                    Prior Living Arrangements/Services Living arrangements for the past 2 months: Single Family Home Lives with:: Self Patient language and need for interpreter reviewed:: Yes Do you feel safe going back to the place where you live?: Yes      Need for Family Participation in Patient Care: Yes (Comment) Care giver support system in place?: Yes (comment) Current home services: Homehealth aide Criminal Activity/Legal Involvement Pertinent to Current Situation/Hospitalization: No - Comment as needed  Activities of Daily Living Home Assistive Devices/Equipment: Grab bars in shower, Electric scooter, Hospital bed, Dentures (specify type), Trapeze ADL Screening (condition at time of admission) Patient's cognitive ability adequate to safely complete daily activities?: Yes Is the patient deaf or have difficulty hearing?: Yes Does the patient have  difficulty seeing, even when wearing glasses/contacts?: No Does the patient have difficulty concentrating, remembering, or making decisions?: No Patient able to express need for assistance with ADLs?: Yes Does the patient have difficulty dressing or bathing?: Yes Independently performs ADLs?: No Communication: Independent Dressing (OT): Independent with device (comment) Grooming: Needs assistance Is this a change from baseline?: Pre-admission baseline Feeding: Independent Bathing: Needs assistance Is this a change from baseline?: Pre-admission baseline Toileting: Needs assistance Is this a change from baseline?: Pre-admission baseline In/Out Bed: Needs assistance Is this a change from baseline?: Change from baseline, expected to last <3 days Walks in Home: Dependent Is this a change from baseline?: Pre-admission baseline Does the patient have difficulty walking or climbing stairs?: Yes Weakness of Legs: Left Weakness of Arms/Hands: Left  Permission Sought/Granted Permission sought to share information with :  Facility Medical sales representative, Family Supports Permission granted to share information with : Yes, Verbal Permission Granted  Share Information with NAME: [name]  Permission granted to share info w AGENCY: SNFs  Permission granted to share info w Relationship: Daughter  Permission granted to share info w Contact Information: [phone]  Emotional Assessment Appearance:: Appears stated age Attitude/Demeanor/Rapport: Unable to Assess Affect (typically observed): Unable to Assess Orientation: : Oriented to Self, Oriented to Place, Oriented to  Time, Oriented to Situation Alcohol / Substance Use: Not Applicable Psych Involvement: No (comment)  Admission diagnosis:  Hypokalemia [E87.6] Fall [W19.XXXA] Acute renal insufficiency [N28.9] Acute cystitis without hematuria [N30.00] Hypotension, unspecified hypotension type [I95.9] Patient Active Problem List   Diagnosis Date  Noted   Fall 10/23/2022   CVA, old, hemiparesis (HCC) 10/23/2022   Urinary retention 10/23/2022   PAD (peripheral artery disease) (HCC) 05/08/2018   Cellulitis of left leg 04/13/2018   Lower extremity cellulitis 02/02/2018   Infected superficial injury of toe of right foot 02/02/2018   Cellulitis 02/02/2018   UTI (urinary tract infection) 11/22/2016   AKI (acute kidney injury) (HCC) 11/22/2016   Inneffective medication administration routine at home 11/22/2016   Dirty living conditions 11/22/2016   Elevated troponin 11/16/2016   Acute metabolic encephalopathy 11/15/2016   Hypokalemia 11/15/2016   Essential hypertension    HLD (hyperlipidemia)    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 [place] [address] Phone: [phone] Fax: [phone]     Social Determinants of Health (SDOH) Social History: SDOH Screenings   Food Insecurity: No Food Insecurity (10/23/2022)  Housing: Patient Declined (10/23/2022)  Transportation Needs: Unmet Transportation Needs (10/23/2022)  Utilities: Not At Risk (10/23/2022)  Tobacco Use: High Risk (10/23/2022)   SDOH Interventions:     Readmission Risk Interventions     No data to display

## 2022-10-24 NOTE — NC FL2 (Signed)
[place] MEDICAID FL2 LEVEL OF CARE FORM     IDENTIFICATION  Patient Name: Joel Solis Birthdate: 01-Nov-1945 Sex: male Admission Date (Current Location): 10/23/2022  [facility] and [place] Number:  Producer, television/film/video and Address:  [facility], [address] [place], [place] [address]      Provider Number: [id number]  Attending Physician Name and Address:  Leroy Sea, MD  Relative Name and Phone Number:       Current Level of Care: Hospital Recommended Level of Care: Skilled Nursing Facility Prior Approval Number:    Date Approved/Denied:   PASRR Number: [id number]  Discharge Plan: SNF    Current Diagnoses: Patient Active Problem List   Diagnosis Date Noted   Fall 10/23/2022   CVA, old, hemiparesis (HCC) 10/23/2022   Urinary retention 10/23/2022   PAD (peripheral artery disease) (HCC) 05/08/2018   Cellulitis of left leg 04/13/2018   Lower extremity cellulitis 02/02/2018   Infected superficial injury of toe of right foot 02/02/2018   Cellulitis 02/02/2018   UTI (urinary tract infection) 11/22/2016   AKI (acute kidney injury) (HCC) 11/22/2016   Inneffective medication administration routine at home 11/22/2016   Dirty living conditions 11/22/2016   Elevated troponin 11/16/2016   Acute metabolic encephalopathy 11/15/2016   Hypokalemia 11/15/2016   Essential hypertension    HLD (hyperlipidemia)    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    S/P VP shunt    Tobacco abuse     Orientation RESPIRATION BLADDER Height & Weight     Self, Time, Situation, Place  Normal Continent Weight: 150 lb 2.1 oz (68.1 kg) Height:  5\' 9"  (175.3 cm)  BEHAVIORAL SYMPTOMS/MOOD NEUROLOGICAL BOWEL NUTRITION STATUS      Continent Diet (See dc summary)  AMBULATORY STATUS COMMUNICATION OF NEEDS Skin   Limited Assist Verbally Normal                       Personal Care Assistance Level of Assistance   Bathing, Feeding, Dressing Bathing Assistance: Limited assistance Feeding assistance: Independent Dressing Assistance: Limited assistance     Functional Limitations Info             SPECIAL CARE FACTORS FREQUENCY  PT (By licensed PT), OT (By licensed OT)     PT Frequency: 5x/week OT Frequency: 5x/week            Contractures Contractures Info: Not present    Additional Factors Info  Code Status, Allergies Code Status Info: DNR Allergies Info: Ethelda Chick, Shellfish Allergy           Current Medications (10/24/2022):  This is the current hospital active medication list Current Facility-Administered Medications  Medication Dose Route Frequency Provider Last Rate Last Admin   acetaminophen (TYLENOL) tablet 650 mg  650 mg Oral Q6H PRN Agbata, Tochukwu, MD   650 mg at 10/24/22 1131   Or   acetaminophen (TYLENOL) suppository 650 mg  650 mg Rectal Q6H PRN Agbata, Tochukwu, MD       atorvastatin (LIPITOR) tablet 40 mg  40 mg Oral Daily Agbata, Tochukwu, MD   40 mg at 10/24/22 0926   cefTRIAXone (ROCEPHIN) 1 g in sodium chloride 0.9 % 100 mL IVPB  1 g Intravenous Q24H Agbata, Tochukwu, MD 200 mL/hr at 10/24/22 0939 1 g at 10/24/22 0939   enoxaparin (LOVENOX) injection 40 mg  40 mg Subcutaneous Q24H Agbata, Tochukwu, MD   40 mg at 10/23/22 2229  lidocaine (XYLOCAINE) 2 % jelly 1 Application  1 Application Urethral Once Leroy Sea, MD       ondansetron (ZOFRAN) tablet 4 mg  4 mg Oral Q6H PRN Agbata, Tochukwu, MD       Or   ondansetron (ZOFRAN) injection 4 mg  4 mg Intravenous Q6H PRN Agbata, Tochukwu, MD       tamsulosin (FLOMAX) capsule 0.4 mg  0.4 mg Oral Daily Leroy Sea, MD   0.4 mg at 10/24/22 1610     Discharge Medications: Please see discharge summary for a list of discharge medications.  Relevant Imaging Results:  Relevant Lab Results:   Additional Information ssn:[phone]  Mearl Latin, LCSW

## 2022-10-24 NOTE — Plan of Care (Signed)

## 2022-10-24 NOTE — Evaluation (Signed)
Occupational Therapy Evaluation Patient Details Name: Joel Solis MRN: [id number] DOB: [birth year] Today's Date: 10/24/2022   History of Present Illness 77 y.o. male with medical history significant for hemorrhagic stroke with left-sided hemiparesis (uses a power chair and is nonambulatory), hypertension, dyslipidemia, peripheral arterial disease, nicotine dependence who presents to the emergency room via EMS for evaluation of weakness and a fall 1 day prior to his admission.   Clinical Impression   PTA, pt lives alone, has caregiver assist 3x/wk and with reported increasing difficulty w/ transfers. Pt typically uses transfer pole for transfers to power chair, reports able to manage "some" ADLs (such as wash up, use of bedpan) without assist. Pt presents now requiring Mod A for bed mobility and moderately elevated bed to attempt standing w/ simulation of home environment. Pt requires Mod A for UB ADL and Max A for LB ADLs. With current functional abilities, pt would need daily caregiver assist for ADLs/transfers but unfortunately does not have this support currently. Patient will benefit from continued inpatient follow up therapy, <3 hours/day       Recommendations for follow up therapy are one component of a multi-disciplinary discharge planning process, led by the attending physician.  Recommendations may be updated based on patient status, additional functional criteria and insurance authorization.   Assistance Recommended at Discharge Frequent or constant Supervision/Assistance  Patient can return home with the following A lot of help with walking and/or transfers;A lot of help with bathing/dressing/bathroom    Functional Status Assessment  Patient has had a recent decline in their functional status and demonstrates the ability to make significant improvements in function in a reasonable and predictable amount of time.  Equipment Recommendations  Other (comment) (TBD pending progress)     Recommendations for Other Services       Precautions / Restrictions Precautions Precautions: Fall Precaution Comments: baseline L hemiplegia (LLE crossed over RLE) Restrictions Weight Bearing Restrictions: No      Mobility Bed Mobility Overal bed mobility: Needs Assistance Bed Mobility: Supine to Sit, Sit to Supine     Supine to sit: Mod assist Sit to supine: Min assist   General bed mobility comments: increased time and effort needed with heavy use of bed rail.    Transfers Overall transfer level: Needs assistance Equipment used: Ambulation equipment used Transfers: Sit to/from Stand Sit to Stand: Min guard, From elevated surface           General transfer comment: patient was able to stand using rail on stedy with bed moderately elevated. he was unable to clear bottom at all from low bed.      Balance Overall balance assessment: Needs assistance Sitting-balance support: Feet supported, Single extremity supported Sitting balance-Leahy Scale: Fair Sitting balance - Comments: LOB w/ dynamic tasks, fairly reliant on having RUE on bedrail   Standing balance support: Single extremity supported, During functional activity, Reliant on assistive device for balance Standing balance-Leahy Scale: Poor                             ADL either performed or assessed with clinical judgement   ADL Overall ADL's : Needs assistance/impaired Eating/Feeding: Set up;Bed level   Grooming: Minimal assistance;Bed level   Upper Body Bathing: Moderate assistance;Sitting   Lower Body Bathing: Maximal assistance;Sitting/lateral leans;Bed level   Upper Body Dressing : Moderate assistance;Sitting   Lower Body Dressing: Maximal assistance;Sitting/lateral leans;Bed level       Toileting- Clothing Manipulation and Hygiene:  Maximal assistance;Bed level         General ADL Comments: Pt with decreased strength from baseline; attempting to simulate home environment though  difficult w/ baseline hemiplegia and LLE crossed over RLE     Vision Baseline Vision/History: 1 Wears glasses Ability to See in Adequate Light: 1 Impaired Patient Visual Report: No change from baseline Vision Assessment?: No apparent visual deficits     Perception     Praxis      Pertinent Vitals/Pain Pain Assessment Pain Assessment: Faces Faces Pain Scale: Hurts little more Pain Location: L arm with mobility Pain Descriptors / Indicators: Discomfort, Sore Pain Intervention(s): Monitored during session, Limited activity within patient's tolerance     Hand Dominance Right   Extremity/Trunk Assessment Upper Extremity Assessment Upper Extremity Assessment: LUE deficits/detail LUE Deficits / Details: baseline hemiplegia; extension contracture w/ digits in flexed position. minimal tolerance to ROM LUE Coordination: decreased fine motor;decreased gross motor   Lower Extremity Assessment Lower Extremity Assessment: Defer to PT evaluation LLE Deficits / Details: left side is contracted and not useful for mobility. L LE he keeps crossed over his right. LLE Coordination: decreased gross motor   Cervical / Trunk Assessment Cervical / Trunk Assessment: Normal   Communication Communication Communication: No difficulties   Cognition Arousal/Alertness: Awake/alert Behavior During Therapy: WFL for tasks assessed/performed Overall Cognitive Status: Within Functional Limits for tasks assessed                                 General Comments: likely at baseline; follows commands consistently; decreased insight into deficits and assistance needed     General Comments  Daughter entering at end of session    Exercises     Shoulder Instructions      Home Living Family/patient expects to be discharged to:: Private residence Living Arrangements: Alone Available Help at Discharge: Personal care attendant;Friend(s);Available PRN/intermittently (3 days/wk) Type of Home:  Apartment Home Access: Level entry     Home Layout: One level     Bathroom Shower/Tub: Sponge bathes at baseline         Home Equipment: Hospital bed;Wheelchair - power;Other (comment) (transfer pole beside bed; bedpan)   Additional Comments: transfer pole in bedroom      Prior Functioning/Environment Prior Level of Function : Needs assist             Mobility Comments: has caregiver 3 x per week- per daughter patient has been having trouble getting up for the last 3 weeks and has mostly remained in bed. ADLs Comments: uses bedpan for bathroom and has assistance for washups from caregiver. Pt reports able to wash up "some" if needed when caregiver not present.        OT Problem List: Decreased strength;Decreased range of motion;Decreased activity tolerance;Impaired balance (sitting and/or standing);Decreased safety awareness;Decreased knowledge of use of DME or AE;Impaired UE functional use      OT Treatment/Interventions: Self-care/ADL training;Therapeutic exercise;Energy conservation;DME and/or AE instruction;Therapeutic activities;Patient/family education    OT Goals(Current goals can be found in the care plan section) Acute Rehab OT Goals Patient Stated Goal: daughter encouraging rehab; pt wants to maximize independence and live on his own at home OT Goal Formulation: With patient/family Time For Goal Achievement: 11/07/22 Potential to Achieve Goals: Fair  OT Frequency: Min 2X/week    Co-evaluation PT/OT/SLP Co-Evaluation/Treatment: Yes Reason for Co-Treatment: To address functional/ADL transfers;For patient/therapist safety   OT goals addressed during session: ADL's and  self-care;Strengthening/ROM      AM-PAC OT "6 Clicks" Daily Activity     Outcome Measure Help from another person eating meals?: A Little Help from another person taking care of personal grooming?: A Little Help from another person toileting, which includes using toliet, bedpan, or urinal?: A  Lot Help from another person bathing (including washing, rinsing, drying)?: A Lot Help from another person to put on and taking off regular upper body clothing?: A Lot Help from another person to put on and taking off regular lower body clothing?: A Lot 6 Click Score: 14   End of Session    Activity Tolerance: Patient tolerated treatment well Patient left: in bed;with call bell/phone within reach;with bed alarm set;with family/visitor present  OT Visit Diagnosis: Unsteadiness on feet (R26.81);Other abnormalities of gait and mobility (R26.89);Muscle weakness (generalized) (M62.81);Hemiplegia and hemiparesis Hemiplegia - Right/Left: Left Hemiplegia - dominant/non-dominant: Non-Dominant Hemiplegia - caused by: Cerebral infarction                Time: 1610-9604 OT Time Calculation (min): 28 min Charges:  OT General Charges $OT Visit: 1 Visit OT Evaluation $OT Eval Moderate Complexity: 1 Mod  Bradd Canary, OTR/L Acute Rehab Services Office: [phone]   Lorre Munroe 10/24/2022, 11:22 AM

## 2022-10-24 NOTE — Discharge Instructions (Signed)
Follow with Primary MD [facility] in 7 days   Get CBC, CMP, Magnesium, UA with culture -  checked next visit with your primary MD   Activity: As tolerated with Full fall precautions use walker/cane & assistance as needed  Disposition Home   Diet: Heart Healthy    Special Instructions: If you have smoked or chewed Tobacco  in the last 2 yrs please stop smoking, stop any regular Alcohol  and or any Recreational drug use.  On your next visit with your primary care physician please Get Medicines reviewed and adjusted.  Please request your Prim.MD to go over all Hospital Tests and Procedure/Radiological results at the follow up, please get all Hospital records sent to your Prim MD by signing hospital release before you go home.  If you experience worsening of your admission symptoms, develop shortness of breath, life threatening emergency, suicidal or homicidal thoughts you must seek medical attention immediately by calling 911 or calling your MD immediately  if symptoms less severe.  You Must read complete instructions/literature along with all the possible adverse reactions/side effects for all the Medicines you take and that have been prescribed to you. Take any new Medicines after you have completely understood and accpet all the possible adverse reactions/side effects.

## 2022-10-25 DIAGNOSIS — E876 Hypokalemia: Secondary | ICD-10-CM | POA: Diagnosis not present

## 2022-10-25 DIAGNOSIS — N289 Disorder of kidney and ureter, unspecified: Secondary | ICD-10-CM | POA: Diagnosis not present

## 2022-10-25 DIAGNOSIS — W19XXXD Unspecified fall, subsequent encounter: Secondary | ICD-10-CM | POA: Diagnosis not present

## 2022-10-25 LAB — BASIC METABOLIC PANEL
Anion gap: 11 (ref 5–15)
BUN: 14 mg/dL (ref 8–23)
CO2: 22 mmol/L (ref 22–32)
Calcium: 7.8 mg/dL — ABNORMAL LOW (ref 8.9–10.3)
Chloride: 104 mmol/L (ref 98–111)
Creatinine, Ser: 1.39 mg/dL — ABNORMAL HIGH (ref 0.61–1.24)
GFR, Estimated: 52 mL/min — ABNORMAL LOW (ref >60)
Glucose, Bld: 139 mg/dL — ABNORMAL HIGH (ref 70–99)
Potassium: 3.6 mmol/L (ref 3.5–5.1)
Sodium: 137 mmol/L (ref 135–145)

## 2022-10-25 LAB — CBC WITH DIFFERENTIAL/PLATELET
Abs Immature Granulocytes: 0.02 10*3/uL (ref 0.00–0.07)
Basophils Absolute: 0 10*3/uL (ref 0.0–0.1)
Basophils Relative: 1 %
Eosinophils Absolute: 0 10*3/uL (ref 0.0–0.5)
Eosinophils Relative: 1 %
HCT: 38.6 % — ABNORMAL LOW (ref 39.0–52.0)
Hemoglobin: 12.6 g/dL — ABNORMAL LOW (ref 13.0–17.0)
Immature Granulocytes: 1 %
Lymphocytes Relative: 21 %
Lymphs Abs: 0.8 10*3/uL (ref 0.7–4.0)
MCH: 29 pg (ref 26.0–34.0)
MCHC: 32.6 g/dL (ref 30.0–36.0)
MCV: 88.9 fL (ref 80.0–100.0)
Monocytes Absolute: 0.3 10*3/uL (ref 0.1–1.0)
Monocytes Relative: 10 %
Neutro Abs: 2.4 10*3/uL (ref 1.7–7.7)
Neutrophils Relative %: 66 %
Platelets: 145 10*3/uL — ABNORMAL LOW (ref 150–400)
RBC: 4.34 MIL/uL (ref 4.22–5.81)
RDW: 13.4 % (ref 11.5–15.5)
WBC: 3.6 10*3/uL — ABNORMAL LOW (ref 4.0–10.5)
nRBC: 0 % (ref 0.0–0.2)

## 2022-10-25 LAB — BRAIN NATRIURETIC PEPTIDE: B Natriuretic Peptide: 419.6 pg/mL — ABNORMAL HIGH (ref 0.0–100.0)

## 2022-10-25 LAB — CULTURE, BLOOD (ROUTINE X 2)

## 2022-10-25 LAB — CK: Total CK: 224 U/L (ref 49–397)

## 2022-10-25 LAB — MAGNESIUM: Magnesium: 1.9 mg/dL (ref 1.7–2.4)

## 2022-10-25 LAB — URINE CULTURE

## 2022-10-25 MED ORDER — AMOXICILLIN 500 MG PO CAPS: 500.0000 mg | ORAL_CAPSULE | Freq: Three times a day (TID) | ORAL | Status: DC

## 2022-10-25 MED ORDER — AMOXICILLIN 500 MG PO CAPS
500.0000 mg | ORAL_CAPSULE | Freq: Three times a day (TID) | ORAL | Status: DC
  Administered 2022-10-25 – 2022-10-26 (×4): 500 mg via ORAL
  Filled 2022-10-25 (×5): qty 1

## 2022-10-25 MED ORDER — CHLORHEXIDINE GLUCONATE CLOTH 2 % EX PADS
6.0000 | MEDICATED_PAD | Freq: Every day | CUTANEOUS | Status: DC
  Administered 2022-10-25 – 2022-10-26 (×2): 6 via TOPICAL

## 2022-10-25 NOTE — Progress Notes (Signed)
[facility]                                                                                                                                                                         Patient Demographics  Joel Solis, is a 77 y.o. male  NWG:[id number]  VHQ:[id number]  DOB - 12/12/1945  Admit date - 10/23/2022  Admitting Physician Lucile Shutters, MD  Outpatient Primary MD for the patient is [facility]  LOS - 0   Chief Complaint  Patient presents with   Hypotension    weakness        Assessment & Plan    Patient seen briefly today due for discharge soon per Discharge done on 10/24/2022 by me, no further issues, Vital signs stable, patient feels fine.  Await SNF bed, initially wanted to go home now wants SNF.    Medications  Scheduled Meds:  atorvastatin  40 mg Oral Daily   lidocaine  1 Application Urethral Once   tamsulosin  0.4 mg Oral Daily   Continuous Infusions:  cefTRIAXone (ROCEPHIN)  IV 1 g (10/24/22 0939)   PRN Meds:.acetaminophen **OR** acetaminophen, ondansetron **OR** ondansetron (ZOFRAN) IV    Time Spent in minutes   10 minutes   Susa Raring M.D on 10/25/2022 at 7:59 AM  Between 7am to 7pm - Pager - [phone]  After 7pm go to [email or web address] - password [email or web address]  And look for the night coverage person covering for me after hours  [facility] Office  [phone]    Subjective:   Vue Beyler today has, No headache, No chest pain, No abdominal pain - No Nausea, No new weakness tingling or numbness, No Cough - SOB.   Objective:   Vitals:   10/24/22 1600 10/24/22 2023 10/25/22 0005 10/25/22 0400  BP:  (!) 105/57 (!) 138/97 128/70  Pulse:  70 77 81  Resp:  17 16 18   Temp: (!) 97 F (36.1 C) 98.7 F (37.1 C) 98.9 F (37.2 C) 98.7 F (37.1 C)  TempSrc: Oral Oral Oral Oral  SpO2:  95% 95%   Weight:      Height:        Wt  Readings from Last 3 Encounters:  10/23/22 68.1 kg  05/08/18 93.9 kg  04/16/18 94.2 kg     Intake/Output Summary (Last 24 hours) at 10/25/2022 0759 Last data filed at 10/25/2022 0600 Gross per 24 hour  Intake --  Output 1400 ml  Net -1400 ml    Exam  Awake Alert, No new F.N deficits,  chronic left-sided weakness, baseline bedbound/wheelchair status [place].AT,PERRAL Supple Neck, No JVD,   Symmetrical Chest wall movement, Good air movement bilaterally, CTAB RRR,No Gallops, Rubs or new Murmurs,  +  ve B.Sounds, Abd Soft, No tenderness,   No Cyanosis, Clubbing or edema   Data Review

## 2022-10-25 NOTE — Plan of Care (Signed)

## 2022-10-25 NOTE — TOC Progression Note (Signed)
Transition of Care [facility]) - Progression Note    Patient Details  Name: Joel Solis MRN: [id number] Date of Birth: 1945-11-27  Transition of Care [facility]) CM/SW Contact  Mearl Latin, LCSW Phone Number: 10/25/2022, 1:51 PM  Clinical Narrative:    Still awaiting insurance approval for [facility]. Daughter updated.    Expected Discharge Plan: Skilled Nursing Facility Barriers to Discharge: Continued Medical Work up, English as a second language teacher  Expected Discharge Plan and Services In-house Referral: Clinical Social Work   Post Acute Care Choice: Skilled Nursing Facility Living arrangements for the past 2 months: Single Family Home Expected Discharge Date: 10/24/22                                     Social Determinants of Health (SDOH) Interventions SDOH Screenings   Food Insecurity: No Food Insecurity (10/23/2022)  Housing: Patient Declined (10/23/2022)  Transportation Needs: Unmet Transportation Needs (10/23/2022)  Utilities: Not At Risk (10/23/2022)  Tobacco Use: High Risk (10/23/2022)    Readmission Risk Interventions     No data to display

## 2022-10-26 ENCOUNTER — Other Ambulatory Visit (HOSPITAL_COMMUNITY): Payer: Self-pay

## 2022-10-26 DIAGNOSIS — E876 Hypokalemia: Secondary | ICD-10-CM | POA: Diagnosis not present

## 2022-10-26 LAB — BLOOD CULTURE ID PANEL (REFLEXED) - BCID2

## 2022-10-26 LAB — CULTURE, BLOOD (ROUTINE X 2)
Special Requests: ADEQUATE
Special Requests: ADEQUATE

## 2022-10-26 MED ORDER — AMOXICILLIN 500 MG PO CAPS: 1000.0000 mg | ORAL_CAPSULE | Freq: Three times a day (TID) | ORAL | Status: DC

## 2022-10-26 MED ORDER — SODIUM CHLORIDE 0.9 % IV SOLN
2.0000 g | Freq: Four times a day (QID) | INTRAVENOUS | Status: DC
  Filled 2022-10-26: qty 2000

## 2022-10-26 NOTE — Progress Notes (Signed)
ID PROGRESS NOTE  Patient discharged at time of notice of his bacteremia to the ID team. Patient 77yo M with hx of stroke, left sided hemiparesis, requiring power chair. initially admitted for urinary obstruction from BPH. He was found to have e.faecalis UTI for which he was being treated with amoxicillin. At day of discharge, day 3, started to have growth from 1 of 4 bottles of blood cx. This is likely secondary to urinary source. Low grade bacteremia  - recommend to treat with 1gm tid x 14 day - have them repeat blood cx in 3 days after completion of oral abtx  Matt Delpizzo B. Drue Second MD MPH [facility] [phone]

## 2022-10-26 NOTE — TOC Transition Note (Signed)
Transition of Care [facility]) - CM/SW Discharge Note   Patient Details  Name: Joel Solis MRN: [id number] Date of Birth: [birth year]  Transition of Care [facility]) CM/SW Contact:  Mearl Latin, LCSW Phone Number: 10/26/2022, 1:33 PM   Clinical Narrative:    Patient will DC to: [facility] Anticipated DC date: 10/26/22 Family notified: Daughter Transport by: Sharin Mons   Per MD patient ready for DC to [facility]. RN to call report prior to discharge [phone] room [phone]). RN, patient, patient's family, and facility notified of DC. Discharge Summary and FL2 sent to facility. DC packet on chart including signed DNR. Ambulance transport requested for patient.   CSW will sign off for now as social work intervention is no longer needed. Please consult [place] again if new needs arise.     Final next level of care: Skilled Nursing Facility Barriers to Discharge: Barriers Resolved   Patient Goals and CMS Choice CMS Medicare.gov Compare Post Acute Care list provided to:: Patient Choice offered to / list presented to : Patient, Adult Children  Discharge Placement     Existing PASRR number confirmed : 10/26/22          Patient chooses bed at: [facility] Patient to be transferred to facility by: [facility] Name of family member notified: Daughter Patient and family notified of of transfer: 10/26/22  Discharge Plan and Services Additional resources added to the After Visit Summary for   In-house Referral: Clinical Social Work   Post Acute Care Choice: Skilled Nursing Facility                               Social Determinants of Health (SDOH) Interventions SDOH Screenings   Food Insecurity: No Food Insecurity (10/23/2022)  Housing: Patient Declined (10/23/2022)  Transportation Needs: Unmet Transportation Needs (10/23/2022)  Utilities: Not At Risk (10/23/2022)  Tobacco Use: High Risk (10/23/2022)     Readmission Risk Interventions     No data to display

## 2022-10-26 NOTE — Progress Notes (Signed)
Physical Therapy Treatment Patient Details Name: Joel Solis MRN: [id number] DOB: 1946-05-18 Today's Date: 10/26/2022   History of Present Illness 77 y.o. male with medical history significant for hemorrhagic stroke with left-sided hemiparesis (uses a power chair and is nonambulatory), hypertension, dyslipidemia, peripheral arterial disease, nicotine dependence who presents to the emergency room via EMS for evaluation of weakness and a fall 1 day prior to his admission.    PT Comments    Pt tolerated today's session well, focused on sitting balance as pt declining attempts at standing or transferring today. Pt performed 2 sets of 10 of reaching outside BOS with RUE, tolerating fairly well with mild to moderate imbalance with sway but no overt LOB. In between sets, pt heavily relies on RUE for balance with use of grab bar. Discharge recommendations remain appropriate and acute PT will continue to follow pt during admission to progress and address deficits and overall mobility.    Recommendations for follow up therapy are one component of a multi-disciplinary discharge planning process, led by the attending physician.  Recommendations may be updated based on patient status, additional functional criteria and insurance authorization.  Follow Up Recommendations  Can patient physically be transported by private vehicle: No    Assistance Recommended at Discharge Frequent or constant Supervision/Assistance  Patient can return home with the following A lot of help with walking and/or transfers;A lot of help with bathing/dressing/bathroom;Assistance with feeding;Direct supervision/assist for medications management   Equipment Recommendations  None recommended by PT    Recommendations for Other Services       Precautions / Restrictions Precautions Precautions: Fall Precaution Comments: baseline L hemiplegia (LLE crossed over RLE) Restrictions Weight Bearing Restrictions: No     Mobility   Bed Mobility Overal bed mobility: Needs Assistance Bed Mobility: Supine to Sit, Sit to Supine     Supine to sit: Min assist Sit to supine: Min assist   General bed mobility comments: increased time with minA for trunk support and mild cueing for BLE management. Pt utilizing bed rail with heavy assist.    Transfers Overall transfer level: Needs assistance                 General transfer comment: pt declining any attempts at standing or transferring to chair today. Attempted side scooting on EOB but pt returned himself back to supine. Pt has a pole at his house he uses to assist with transfers and bed mobility, reports doesn't feel comfortable attempting without the pole    Ambulation/Gait               General Gait Details: patient is non-ambulatory at baseline   Stairs             Wheelchair Mobility    Modified Rankin (Stroke Patients Only)       Balance Overall balance assessment: Needs assistance Sitting-balance support: Feet supported, Single extremity supported Sitting balance-Leahy Scale: Fair Sitting balance - Comments: reliant on RUE with use of bedrail for support but able to complete dynamic reaching outside of BOS with only mild to moderate instability but no overt LOB, able to self correct with close guard throughout                                    Cognition Arousal/Alertness: Awake/alert Behavior During Therapy: [facility] for tasks assessed/performed Overall Cognitive Status: Within Functional Limits for tasks assessed  General Comments: likely at baseline, pleasant during session and recalling previous therapy in other settings        Exercises      General Comments General comments (skin integrity, edema, etc.): VSS on room air, no family at bedside      Pertinent Vitals/Pain Pain Assessment Pain Assessment: Faces Faces Pain Scale: No hurt    Home Living                           Prior Function            PT Goals (current goals can now be found in the care plan section) Acute Rehab PT Goals Patient Stated Goal: to get additional rehab then go home. PT Goal Formulation: With patient/family Time For Goal Achievement: 11/07/22 Potential to Achieve Goals: Fair Progress towards PT goals: Progressing toward goals    Frequency    Min 3X/week      PT Plan Current plan remains appropriate    Co-evaluation              AM-PAC PT "6 Clicks" Mobility   Outcome Measure  Help needed turning from your back to your side while in a flat bed without using bedrails?: A Little Help needed moving from lying on your back to sitting on the side of a flat bed without using bedrails?: A Little Help needed moving to and from a bed to a chair (including a wheelchair)?: A Lot Help needed standing up from a chair using your arms (e.g., wheelchair or bedside chair)?: A Lot Help needed to walk in hospital room?: Total Help needed climbing 3-5 steps with a railing? : Total 6 Click Score: 12    End of Session   Activity Tolerance: Patient tolerated treatment well Patient left: in bed;with call bell/phone within reach;with bed alarm set Nurse Communication: Mobility status PT Visit Diagnosis: Other abnormalities of gait and mobility (R26.89);Muscle weakness (generalized) (M62.81);History of falling (Z91.81)     Time: 1610-9604 PT Time Calculation (min) (ACUTE ONLY): 18 min  Charges:  $Therapeutic Activity: 8-22 mins                     Lindalou Hose, PT DPT Acute Rehabilitation Services Office [phone]    Leonie Man 10/26/2022, 3:40 PM

## 2022-10-26 NOTE — TOC Progression Note (Signed)
CTransition of Care [facility]) - Progression Note    Patient Details  Name: Joel Solis MRN: [id number] Date of Birth: [birth year]  Transition of Care [facility]) CM/SW Contact  Mearl Latin, LCSW Phone Number: 10/26/2022, 8:33 AM  Clinical Narrative:    CSW received confirmation that [facility] has approved [facility]. CSW awaiting confirmation from SNF. Sent DC Summary.    Expected Discharge Plan: Skilled Nursing Facility Barriers to Discharge: Continued Medical Work up, English as a second language teacher  Expected Discharge Plan and Services In-house Referral: Clinical Social Work   Post Acute Care Choice: Skilled Nursing Facility Living arrangements for the past 2 months: Single Family Home Expected Discharge Date: 10/26/22                                     Social Determinants of Health (SDOH) Interventions SDOH Screenings   Food Insecurity: No Food Insecurity (10/23/2022)  Housing: Patient Declined (10/23/2022)  Transportation Needs: Unmet Transportation Needs (10/23/2022)  Utilities: Not At Risk (10/23/2022)  Tobacco Use: High Risk (10/23/2022)    Readmission Risk Interventions     No data to display

## 2022-10-26 NOTE — Progress Notes (Signed)
[facility]                                                                                                                                                                         Patient Demographics  Joel Solis, is a 77 y.o. male  UJW:[id number]  FAO:[id number]  DOB - Apr 03, 1946  Admit date - 10/23/2022  Admitting Physician Lucile Shutters, MD  Outpatient Primary MD for the patient is [facility]  LOS - 0   Chief Complaint  Patient presents with   Hypotension    weakness        Assessment & Plan    Patient seen briefly today due for discharge soon per Discharge done on 10/24/2022 by me, no further issues, Vital signs stable, patient feels fine.  Await SNF bed, initially wanted to go home now wants SNF.    Medications  Scheduled Meds:  amoxicillin  500 mg Oral Q8H   atorvastatin  40 mg Oral Daily   Chlorhexidine Gluconate Cloth  6 each Topical Daily   lidocaine  1 Application Urethral Once   tamsulosin  0.4 mg Oral Daily   Continuous Infusions:   PRN Meds:.acetaminophen **OR** acetaminophen, ondansetron **OR** ondansetron (ZOFRAN) IV    Time Spent in minutes   10 minutes   Susa Raring M.D on 10/26/2022 at 7:57 AM  Between 7am to 7pm - Pager - [phone]  After 7pm go to [email or web address] - password [email or web address]  And look for the night coverage person covering for me after hours  [facility] Office  [phone]    Subjective:   Joel Solis today has, No headache, No chest pain, No abdominal pain - No Nausea, No new weakness tingling or numbness, No Cough - SOB.   Objective:   Vitals:   10/25/22 2000 10/26/22 0000 10/26/22 0400 10/26/22 0729  BP: 123/75 117/85 111/70   Pulse: 75 71 63   Resp: 17 19 18    Temp: 98.3 F (36.8 C) 98.2 F (36.8 C) 98.5 F (36.9 C) (!) 97.5 F (36.4 C)  TempSrc: Oral Oral Oral Oral  SpO2: 97% 94% 96%    Weight:      Height:        Wt Readings from Last 3 Encounters:  10/23/22 68.1 kg  05/08/18 93.9 kg  04/16/18 94.2 kg     Intake/Output Summary (Last 24 hours) at 10/26/2022 0757 Last data filed at 10/26/2022 0440 Gross per 24 hour  Intake 240 ml  Output 1100 ml  Net -860 ml    Exam  Awake Alert, No new F.N deficits,  chronic left-sided weakness, baseline bedbound/wheelchair status, Coude catheter [place].AT,PERRAL Supple Neck, No JVD,   Symmetrical Chest wall movement, Good air  movement bilaterally, CTAB RRR,No Gallops, Rubs or new Murmurs,  +ve B.Sounds, Abd Soft, No tenderness,   No Cyanosis, Clubbing or edema   Data Review

## 2022-10-26 NOTE — TOC Transition Note (Signed)
Transition of Care [facility]) - CM/SW Discharge Note   Patient Details  Name: Joel Solis MRN: [id number] Date of Birth: 09-23-1945  Transition [facility] Care [facility]) CM/SW Contact:  Mearl Latin, LCSW Phone Number: 10/26/2022, 3:30 PM   Clinical Narrative:    CSW received request to send updated DC Summary to SNF. CSW made SNF aware.    Final next level of care: Skilled Nursing Facility Barriers to Discharge: Barriers Resolved   Patient Goals and CMS Choice CMS Medicare.gov Compare Post Acute Care list provided to:: Patient Choice offered to / list presented to : Patient, Adult Children  Discharge Placement     Existing PASRR number confirmed : 10/26/22          Patient chooses bed at: [facility] Patient to be transferred to facility by: [facility] Name of family member notified: Daughter Patient and family notified of of transfer: 10/26/22  Discharge Plan and Services Additional resources added to the After Visit Summary for   In-house Referral: Clinical Social Work   Post Acute Care Choice: Skilled Nursing Facility                               Social Determinants of Health (SDOH) Interventions SDOH Screenings   Food Insecurity: No Food Insecurity (10/23/2022)  Housing: Patient Declined (10/23/2022)  Transportation Needs: Unmet Transportation Needs (10/23/2022)  Utilities: Not At Risk (10/23/2022)  Tobacco Use: High Risk (10/23/2022)     Readmission Risk Interventions     No data to display

## 2022-10-26 NOTE — Progress Notes (Signed)
PHARMACY - PHYSICIAN COMMUNICATION CRITICAL VALUE ALERT - BLOOD CULTURE IDENTIFICATION (BCID)  Joel Solis is an 77 y.o. male who presented to [facility] on 10/23/2022 with a chief complaint of weakness/fall  Assessment:  64 YOM receiving treatment for E faecalis UTI now with 1 of 4 BCx showing GPC with BCID detecting same enterococcus faecalis.   Name of physician (or Provider) Contacted: Thedore Mins, automatic ID consult  Current antibiotics: Amoxicillin 500 mg po q8h  Changes to prescribed antibiotics recommended:  Ampicillin 2g IV every 6 hours (renal reduction)  Results for orders placed or performed during the hospital encounter of 10/23/22  Blood Culture ID Panel (Reflexed) (Collected: 10/23/2022  7:51 PM)  Result Value Ref Range   Enterococcus faecalis DETECTED (A) NOT DETECTED   Enterococcus Faecium NOT DETECTED NOT DETECTED   Listeria monocytogenes NOT DETECTED NOT DETECTED   Staphylococcus species NOT DETECTED NOT DETECTED   Staphylococcus aureus (BCID) NOT DETECTED NOT DETECTED   Staphylococcus epidermidis NOT DETECTED NOT DETECTED   Staphylococcus lugdunensis NOT DETECTED NOT DETECTED   Streptococcus species NOT DETECTED NOT DETECTED   Streptococcus agalactiae NOT DETECTED NOT DETECTED   Streptococcus pneumoniae NOT DETECTED NOT DETECTED   Streptococcus pyogenes NOT DETECTED NOT DETECTED   A.calcoaceticus-baumannii NOT DETECTED NOT DETECTED   Bacteroides fragilis NOT DETECTED NOT DETECTED   Enterobacterales NOT DETECTED NOT DETECTED   Enterobacter cloacae complex NOT DETECTED NOT DETECTED   Escherichia coli NOT DETECTED NOT DETECTED   Klebsiella aerogenes NOT DETECTED NOT DETECTED   Klebsiella oxytoca NOT DETECTED NOT DETECTED   Klebsiella pneumoniae NOT DETECTED NOT DETECTED   Proteus species NOT DETECTED NOT DETECTED   Salmonella species NOT DETECTED NOT DETECTED   Serratia marcescens NOT DETECTED NOT DETECTED   Haemophilus influenzae NOT DETECTED NOT DETECTED    Neisseria meningitidis NOT DETECTED NOT DETECTED   Pseudomonas aeruginosa NOT DETECTED NOT DETECTED   Stenotrophomonas maltophilia NOT DETECTED NOT DETECTED   Candida albicans NOT DETECTED NOT DETECTED   Candida auris NOT DETECTED NOT DETECTED   Candida glabrata NOT DETECTED NOT DETECTED   Candida krusei NOT DETECTED NOT DETECTED   Candida parapsilosis NOT DETECTED NOT DETECTED   Candida tropicalis NOT DETECTED NOT DETECTED   Cryptococcus neoformans/gattii NOT DETECTED NOT DETECTED   Vancomycin resistance NOT DETECTED NOT DETECTED    Thank you for allowing pharmacy to be a part of this patient's care.  Georgina Pillion, PharmD, BCPS Infectious Diseases Clinical Pharmacist 10/26/2022 3:12 PM   **Pharmacist phone directory can now be found on [email or web address] ([email or web address]).  Listed under [facility].

## 2022-10-27 LAB — CULTURE, BLOOD (ROUTINE X 2)

## 2022-10-28 LAB — CULTURE, BLOOD (ROUTINE X 2)

## 2023-05-16 ENCOUNTER — Emergency Department (HOSPITAL_COMMUNITY)
Admission: EM | Admit: 2023-05-16 | Discharge: 2023-05-17 | Disposition: A | Discharge status: Home / Self Care | Payer: No Typology Code available for payment source | Attending: Emergency Medicine | Admitting: Emergency Medicine

## 2023-05-16 ENCOUNTER — Emergency Department (HOSPITAL_COMMUNITY): Payer: No Typology Code available for payment source

## 2023-05-16 ENCOUNTER — Other Ambulatory Visit: Payer: Self-pay

## 2023-05-16 ENCOUNTER — Encounter (HOSPITAL_COMMUNITY): Payer: Self-pay

## 2023-05-16 DIAGNOSIS — F17200 Nicotine dependence, unspecified, uncomplicated: Secondary | ICD-10-CM | POA: Diagnosis not present

## 2023-05-16 DIAGNOSIS — S51811A Laceration without foreign body of right forearm, initial encounter: Secondary | ICD-10-CM | POA: Insufficient documentation

## 2023-05-16 DIAGNOSIS — S0012XA Contusion of left eyelid and periocular area, initial encounter: Secondary | ICD-10-CM | POA: Insufficient documentation

## 2023-05-16 DIAGNOSIS — W01198A Fall on same level from slipping, tripping and stumbling with subsequent striking against other object, initial encounter: Secondary | ICD-10-CM | POA: Diagnosis not present

## 2023-05-16 DIAGNOSIS — Z79899 Other long term (current) drug therapy: Secondary | ICD-10-CM | POA: Diagnosis not present

## 2023-05-16 DIAGNOSIS — W19XXXA Unspecified fall, initial encounter: Secondary | ICD-10-CM

## 2023-05-16 DIAGNOSIS — Z8673 Personal history of transient ischemic attack (TIA), and cerebral infarction without residual deficits: Secondary | ICD-10-CM | POA: Insufficient documentation

## 2023-05-16 DIAGNOSIS — R519 Headache, unspecified: Secondary | ICD-10-CM | POA: Diagnosis not present

## 2023-05-16 DIAGNOSIS — I10 Essential (primary) hypertension: Secondary | ICD-10-CM | POA: Diagnosis not present

## 2023-05-16 NOTE — ED Triage Notes (Addendum)
Patient BIB [facility] from home for fall while patient was transferring to his wheelchair where he hit his head on the wheelchair. Patient has no complaints of neck or back pain, only pain to his bottom and a hematoma to his left eye. Patient has hx of pain to bottom and hx of deficits d/t stroke. VSS, BP 118/58, HR 86, 99% RA, RR 15, A&Ox4 with EMS. Patient called daughter who did not answer.

## 2023-05-16 NOTE — Discharge Instructions (Signed)
Your CT scans today were reassuring.  There were no fractures, dislocations, or acute intracranial abnormalities noted.  Please follow-up as needed with your primary care provider.  If you develop any life-threatening symptoms return to the emergency department.

## 2023-05-16 NOTE — ED Provider Notes (Signed)
[place] EMERGENCY DEPARTMENT AT [facility] Provider Note   CSN: [id number] Arrival date & time: 05/16/23  2159     History  Chief Complaint  Patient presents with   Joel Solis is a 77 y.o. male.  Patient with past medical history significant for CVA with deficits, hypertension presents to the emergency department via EMS for evaluation secondary to a fall.  Patient states he was transferring to his wheelchair when he slipped and hit his head on the wheelchair.  He has bruising around the left eye.  He does endorse hitting his head on the wheelchair.  He denies weakness, chest pain, shortness of breath, neck pain, other complaints.   Fall       Home Medications Prior to Admission medications   Medication Sig Start Date End Date Taking? Authorizing Provider  amoxicillin (AMOXIL) 500 MG capsule Take 2 capsules (1,000 mg total) by mouth 3 (three) times daily. 10/26/22   Leroy Sea, MD  atorvastatin (LIPITOR) 40 MG tablet Take 40 mg by mouth daily.    [provider]  carvedilol (COREG) 6.25 MG tablet Take 1 tablet (6.25 mg total) by mouth 2 (two) times daily with a meal. 10/25/22   Leroy Sea, MD  tamsulosin (FLOMAX) 0.4 MG CAPS capsule Take 1 capsule (0.4 mg total) by mouth daily. 10/24/22   Leroy Sea, MD      Allergies    Oyster shell and Shellfish allergy    Review of Systems   Review of Systems  Physical Exam Updated Vital Signs BP 120/61   Pulse 87   Resp 15   SpO2 96%  Physical Exam HENT:     Head: Normocephalic.     Comments: Hematoma noted above left eye, some bruising noted below left eye Eyes:     Conjunctiva/sclera: Conjunctivae normal.     Pupils: Pupils are equal, round, and reactive to light.  Pulmonary:     Effort: Pulmonary effort is normal. No respiratory distress.  Musculoskeletal:        General: No signs of injury.     Cervical back: Normal range of motion. No tenderness.  Skin:    General:  Skin is dry.     Comments: Small skin tear noted on patient's right forearm  Neurological:     Mental Status: He is alert. Mental status is at baseline.  Psychiatric:        Speech: Speech normal.        Behavior: Behavior normal.     ED Results / Procedures / Treatments   Labs (all labs ordered are listed, but only abnormal results are displayed) Labs Reviewed - No data to display  EKG None  Radiology CT Cervical Spine Wo Contrast Result Date: 05/16/2023 CLINICAL DATA:  Poly trauma, blunt due to a fall EXAM: CT CERVICAL SPINE WITHOUT CONTRAST TECHNIQUE: Multidetector CT imaging of the cervical spine was performed without intravenous contrast. Multiplanar CT image reconstructions were also generated. RADIATION DOSE REDUCTION: This exam was performed according to the departmental dose-optimization program which includes automated exposure control, adjustment of the mA and/or kV according to patient size and/or use of iterative reconstruction technique. COMPARISON:  None Available. FINDINGS: Alignment: Normal. Skull base and vertebrae: No acute fracture. No primary bone lesion or focal pathologic process. Soft tissues and spinal canal: No prevertebral fluid or swelling. No visible canal hematoma. Disc levels: Degenerative changes with disc space narrowing and endplate osteophyte formation most prominent at C5-6  and C6-7 levels. Upper chest: Postoperative changes consistent with right carotid endarterectomy. Ventricular shunt tubing is demonstrated in the right side of the neck. Other: None. IMPRESSION: Normal alignment of the cervical spine. No acute displaced fractures are demonstrated. Moderate degenerative changes. Electronically Signed   By: Burman Nieves M.D.   On: 05/16/2023 23:32   CT Maxillofacial Wo Contrast Result Date: 05/16/2023 CLINICAL DATA:  Blunt facial trauma due to a fall. EXAM: CT MAXILLOFACIAL WITHOUT CONTRAST TECHNIQUE: Multidetector CT imaging of the maxillofacial  structures was performed. Multiplanar CT image reconstructions were also generated. RADIATION DOSE REDUCTION: This exam was performed according to the departmental dose-optimization program which includes automated exposure control, adjustment of the mA and/or kV according to patient size and/or use of iterative reconstruction technique. COMPARISON:  CT head 05/16/2023 FINDINGS: Osseous: No fracture or mandibular dislocation. No destructive process. Orbits: The globes and extraocular muscles appear intact and symmetrical. No retrobulbar infiltration. Sinuses: Paranasal sinuses and mastoid air cells are clear. Soft tissues: No soft tissue swelling, mass, or hematoma. Postoperative changes in the right carotid artery. Limited intracranial: No significant or unexpected finding. IMPRESSION: No acute displaced orbital or facial fractures are identified. Electronically Signed   By: Burman Nieves M.D.   On: 05/16/2023 23:30   CT Head Wo Contrast Result Date: 05/16/2023 CLINICAL DATA:  Poly trauma, blunt due to a fall. EXAM: CT HEAD WITHOUT CONTRAST TECHNIQUE: Contiguous axial images were obtained from the base of the skull through the vertex without intravenous contrast. RADIATION DOSE REDUCTION: This exam was performed according to the departmental dose-optimization program which includes automated exposure control, adjustment of the mA and/or kV according to patient size and/or use of iterative reconstruction technique. COMPARISON:  MRI brain 11/16/2016.  CT head 11/15/2016 FINDINGS: Brain: Diffuse cerebral atrophy. Ventricular dilatation consistent with central atrophy. Low-attenuation changes in the deep white matter consistent with small vessel ischemia. No abnormal extra-axial fluid collections. No mass effect or midline shift. Gray-white matter junctions are distinct. Basal cisterns are not effaced. No acute intracranial hemorrhage. Right trans frontal ventricular shunt tube with tip in the medial right  lateral ventricle. Vascular: No hyperdense vessel or unexpected calcification. Skull: Right frontal burr hole.  No acute depressed skull fractures. Sinuses/Orbits: Paranasal sinuses and mastoid air cells are clear. Other: None. IMPRESSION: No acute intracranial abnormalities. Chronic atrophy and small vessel ischemic changes. Right trans frontal ventricular shunt tube. Ventricles remain mildly dilated but without change since previous studies. Electronically Signed   By: Burman Nieves M.D.   On: 05/16/2023 23:28    Procedures Procedures    Medications Ordered in ED Medications - No data to display  ED Course/ Medical Decision Making/ A&P                                 Medical Decision Making Amount and/or Complexity of Data Reviewed Radiology: ordered.   This patient presents to the ED for concern of possible head injury secondary to fall, this involves an extensive number of treatment options, and is a complaint that carries with it a high risk of complications and morbidity.  The differential diagnosis includes fracture, dislocation, soft tissue injury, others   Co morbidities that complicate the patient evaluation  Deficits from previous stroke   Additional history obtained:  Additional history obtained from EMS   Imaging Studies ordered:  I ordered imaging studies including CT of the head, maxillofacial, and cervical spine without contrast  I independently visualized and interpreted imaging which showed no acute findings I agree with the radiologist interpretation    Social Determinants of Health:  Patient is a daily smoker with difficulty arranging transportation   Test / Admission - Considered:  Patient with no acute findings on imaging.  He does have a hematoma and some bruising but no wounds that require repair at this time.  Fall was described as mechanical.  There is no indication for medical workup at this time.  Plan to discharge home with fall prevention  education.         Final Clinical Impression(s) / ED Diagnoses Final diagnoses:  Fall, initial encounter    Rx / DC Orders ED Discharge Orders     None         Pamala Duffel 05/16/23 2344    Zadie Rhine, MD 05/17/23 [phone]

## 2023-05-25 ENCOUNTER — Encounter (HOSPITAL_COMMUNITY): Payer: Self-pay

## 2023-05-25 ENCOUNTER — Other Ambulatory Visit: Payer: Self-pay

## 2023-05-25 ENCOUNTER — Inpatient Hospital Stay (HOSPITAL_COMMUNITY)
Admission: EM | Admit: 2023-05-25 | Discharge: 2023-06-08 | DRG: 603 | Disposition: A | Discharge status: Skilled Nursing Facility | Payer: No Typology Code available for payment source | Attending: Family Medicine | Admitting: Family Medicine

## 2023-05-25 ENCOUNTER — Emergency Department (HOSPITAL_COMMUNITY): Payer: No Typology Code available for payment source

## 2023-05-25 DIAGNOSIS — E785 Hyperlipidemia, unspecified: Secondary | ICD-10-CM | POA: Diagnosis present

## 2023-05-25 DIAGNOSIS — L03116 Cellulitis of left lower limb: Secondary | ICD-10-CM | POA: Diagnosis not present

## 2023-05-25 DIAGNOSIS — Z993 Dependence on wheelchair: Secondary | ICD-10-CM

## 2023-05-25 DIAGNOSIS — E876 Hypokalemia: Secondary | ICD-10-CM | POA: Diagnosis present

## 2023-05-25 DIAGNOSIS — L0390 Cellulitis, unspecified: Secondary | ICD-10-CM | POA: Diagnosis present

## 2023-05-25 DIAGNOSIS — Z800 Family history of malignant neoplasm of digestive organs: Secondary | ICD-10-CM

## 2023-05-25 DIAGNOSIS — I10 Essential (primary) hypertension: Secondary | ICD-10-CM | POA: Diagnosis present

## 2023-05-25 DIAGNOSIS — L03314 Cellulitis of groin: Secondary | ICD-10-CM | POA: Diagnosis present

## 2023-05-25 DIAGNOSIS — Z79899 Other long term (current) drug therapy: Secondary | ICD-10-CM

## 2023-05-25 DIAGNOSIS — I739 Peripheral vascular disease, unspecified: Secondary | ICD-10-CM | POA: Diagnosis present

## 2023-05-25 DIAGNOSIS — M24542 Contracture, left hand: Secondary | ICD-10-CM | POA: Diagnosis present

## 2023-05-25 DIAGNOSIS — Z1152 Encounter for screening for COVID-19: Secondary | ICD-10-CM

## 2023-05-25 DIAGNOSIS — Z602 Problems related to living alone: Secondary | ICD-10-CM | POA: Diagnosis present

## 2023-05-25 DIAGNOSIS — R627 Adult failure to thrive: Secondary | ICD-10-CM

## 2023-05-25 DIAGNOSIS — Z91013 Allergy to seafood: Secondary | ICD-10-CM

## 2023-05-25 DIAGNOSIS — Z801 Family history of malignant neoplasm of trachea, bronchus and lung: Secondary | ICD-10-CM

## 2023-05-25 DIAGNOSIS — I6930 Unspecified sequelae of cerebral infarction: Secondary | ICD-10-CM

## 2023-05-25 DIAGNOSIS — I69254 Hemiplegia and hemiparesis following other nontraumatic intracranial hemorrhage affecting left non-dominant side: Secondary | ICD-10-CM

## 2023-05-25 DIAGNOSIS — F17210 Nicotine dependence, cigarettes, uncomplicated: Secondary | ICD-10-CM | POA: Diagnosis present

## 2023-05-25 DIAGNOSIS — R531 Weakness: Principal | ICD-10-CM

## 2023-05-25 DIAGNOSIS — T07XXXA Unspecified multiple injuries, initial encounter: Secondary | ICD-10-CM

## 2023-05-25 DIAGNOSIS — E8809 Other disorders of plasma-protein metabolism, not elsewhere classified: Secondary | ICD-10-CM

## 2023-05-25 DIAGNOSIS — Z982 Presence of cerebrospinal fluid drainage device: Secondary | ICD-10-CM

## 2023-05-25 DIAGNOSIS — Z751 Person awaiting admission to adequate facility elsewhere: Secondary | ICD-10-CM

## 2023-05-25 LAB — COMPREHENSIVE METABOLIC PANEL
ALT: 14 U/L (ref 0–44)
AST: 20 U/L (ref 15–41)
Albumin: 3.1 g/dL — ABNORMAL LOW (ref 3.5–5.0)
Alkaline Phosphatase: 92 U/L (ref 38–126)
Anion gap: 8 (ref 5–15)
BUN: 24 mg/dL — ABNORMAL HIGH (ref 8–23)
CO2: 23 mmol/L (ref 22–32)
Calcium: 8.1 mg/dL — ABNORMAL LOW (ref 8.9–10.3)
Chloride: 106 mmol/L (ref 98–111)
Creatinine, Ser: 0.91 mg/dL (ref 0.61–1.24)
GFR, Estimated: >60 mL/min (ref >60)
Glucose, Bld: 104 mg/dL — ABNORMAL HIGH (ref 70–99)
Potassium: 3.2 mmol/L — ABNORMAL LOW (ref 3.5–5.1)
Sodium: 137 mmol/L (ref 135–145)
Total Bilirubin: 0.7 mg/dL (ref 0.0–1.2)
Total Protein: 6.3 g/dL — ABNORMAL LOW (ref 6.5–8.1)

## 2023-05-25 LAB — CBC
HCT: 44.7 % (ref 39.0–52.0)
Hemoglobin: 13.8 g/dL (ref 13.0–17.0)
MCH: 28 pg (ref 26.0–34.0)
MCHC: 30.9 g/dL (ref 30.0–36.0)
MCV: 90.7 fL (ref 80.0–100.0)
Platelets: 262 10*3/uL (ref 150–400)
RBC: 4.93 MIL/uL (ref 4.22–5.81)
RDW: 13.4 % (ref 11.5–15.5)
WBC: 11.9 10*3/uL — ABNORMAL HIGH (ref 4.0–10.5)
nRBC: 0 % (ref 0.0–0.2)

## 2023-05-25 LAB — RESP PANEL BY RT-PCR (RSV, FLU A&B, COVID)  RVPGX2
Influenza A by PCR: NEGATIVE
Influenza B by PCR: NEGATIVE
Resp Syncytial Virus by PCR: NEGATIVE
SARS Coronavirus 2 by RT PCR: NEGATIVE

## 2023-05-25 LAB — TROPONIN I (HIGH SENSITIVITY): Troponin I (High Sensitivity): 36 ng/L — ABNORMAL HIGH (ref <18)

## 2023-05-25 MED ORDER — ASPIRIN 81 MG PO TBEC
81.0000 mg | DELAYED_RELEASE_TABLET | Freq: Every day | ORAL | Status: DC
  Administered 2023-05-26 – 2023-06-08 (×14): 81 mg via ORAL
  Filled 2023-05-25 (×14): qty 1

## 2023-05-25 MED ORDER — SENNOSIDES-DOCUSATE SODIUM 8.6-50 MG PO TABS: 1.0000 | ORAL_TABLET | Freq: Every evening | ORAL | Status: DC | PRN

## 2023-05-25 MED ORDER — POTASSIUM CHLORIDE CRYS ER 20 MEQ PO TBCR
40.0000 meq | EXTENDED_RELEASE_TABLET | Freq: Once | ORAL | Status: AC
  Administered 2023-05-25: 40 meq via ORAL
  Filled 2023-05-25: qty 2

## 2023-05-25 MED ORDER — ONDANSETRON HCL 4 MG/2ML IJ SOLN: 4.0000 mg | Freq: Four times a day (QID) | INTRAMUSCULAR | Status: DC | PRN

## 2023-05-25 MED ORDER — ENOXAPARIN SODIUM 40 MG/0.4ML IJ SOSY
40.0000 mg | PREFILLED_SYRINGE | INTRAMUSCULAR | Status: DC
  Administered 2023-05-26 – 2023-06-08 (×14): 40 mg via SUBCUTANEOUS
  Filled 2023-05-25 (×14): qty 0.4

## 2023-05-25 MED ORDER — METOPROLOL TARTRATE 50 MG PO TABS
100.0000 mg | ORAL_TABLET | Freq: Two times a day (BID) | ORAL | Status: DC
Start: 2023-05-25 — End: 2023-06-08
  Administered 2023-05-26 – 2023-06-08 (×28): 100 mg via ORAL
  Filled 2023-05-25 (×5): qty 2
  Filled 2023-05-25: qty 4
  Filled 2023-05-25 (×6): qty 2
  Filled 2023-05-25: qty 4
  Filled 2023-05-25 (×15): qty 2

## 2023-05-25 MED ORDER — ACETAMINOPHEN 325 MG PO TABS
650.0000 mg | ORAL_TABLET | Freq: Four times a day (QID) | ORAL | Status: DC | PRN
  Administered 2023-05-26 – 2023-05-28 (×6): 650 mg via ORAL
  Filled 2023-05-25 (×6): qty 2

## 2023-05-25 MED ORDER — CEFAZOLIN SODIUM-DEXTROSE 1-4 GM/50ML-% IV SOLN: 1.0000 g | Freq: Once | INTRAVENOUS | Status: DC

## 2023-05-25 MED ORDER — AMLODIPINE BESYLATE 10 MG PO TABS
10.0000 mg | ORAL_TABLET | Freq: Every day | ORAL | Status: DC
  Administered 2023-05-26 – 2023-06-08 (×14): 10 mg via ORAL
  Filled 2023-05-25 (×13): qty 1
  Filled 2023-05-25: qty 2

## 2023-05-25 MED ORDER — LISINOPRIL 20 MG PO TABS
20.0000 mg | ORAL_TABLET | Freq: Every day | ORAL | Status: DC
  Administered 2023-05-26 – 2023-06-08 (×14): 20 mg via ORAL
  Filled 2023-05-25 (×9): qty 1
  Filled 2023-05-25: qty 2
  Filled 2023-05-25 (×4): qty 1

## 2023-05-25 MED ORDER — ACETAMINOPHEN 650 MG RE SUPP: 650.0000 mg | Freq: Four times a day (QID) | RECTAL | Status: DC | PRN

## 2023-05-25 MED ORDER — ONDANSETRON HCL 4 MG PO TABS: 4.0000 mg | ORAL_TABLET | Freq: Four times a day (QID) | ORAL | Status: DC | PRN

## 2023-05-25 MED ORDER — OXYCODONE-ACETAMINOPHEN 5-325 MG PO TABS
1.0000 | ORAL_TABLET | Freq: Once | ORAL | Status: AC
  Administered 2023-05-25: 1 via ORAL
  Filled 2023-05-25: qty 1

## 2023-05-25 MED ORDER — CEFAZOLIN SODIUM-DEXTROSE 1-4 GM/50ML-% IV SOLN
1.0000 g | Freq: Three times a day (TID) | INTRAVENOUS | Status: AC
  Administered 2023-05-25 – 2023-06-01 (×21): 1 g via INTRAVENOUS
  Filled 2023-05-25 (×21): qty 50

## 2023-05-25 MED ORDER — ATORVASTATIN CALCIUM 20 MG PO TABS
40.0000 mg | ORAL_TABLET | Freq: Every day | ORAL | Status: DC
  Administered 2023-05-26 – 2023-06-08 (×14): 40 mg via ORAL
  Filled 2023-05-25 (×11): qty 2
  Filled 2023-05-25: qty 1
  Filled 2023-05-25 (×2): qty 2

## 2023-05-25 NOTE — ED Notes (Signed)
 Pt refused cath. Still refusing bed to be changed.

## 2023-05-25 NOTE — ED Notes (Signed)
 Pt still refusing cath. Pt did allow bed to be changed. Bed was soiled with urine. MD made aware.

## 2023-05-25 NOTE — ED Notes (Signed)
 Offered to change pt bed. PT stated it didn't need to be changed. Pt asking for pain meds. When I explained to him none had been ordered at this time but I would ask the DR pt became verbally aggressive.

## 2023-05-25 NOTE — ED Provider Notes (Signed)
 [place] EMERGENCY DEPARTMENT AT [facility] Provider Note   CSN: [id number] Arrival date & time: 05/25/23  1645     History  Chief Complaint  Patient presents with   Extremity Pain   Pressure Ulcer    Joel Solis is a 78 y.o. male.  Pt with hx cva, wheelchair-bound at baseline, presents with generalized weakness, and pain 'all over', and especially in legs. Pt in soiled, wet clothing with multiple areas of excoriated, irritated skin to perineal area, groin creases and legs. No fevers. Denies new trauma/injury. Is generally weak, having difficulty attending to ADLs, lives alone.   The history is provided by the patient, medical records and the EMS personnel. The history is limited by the condition of the patient.  Extremity Pain Pertinent negatives include no chest pain, no abdominal pain, no headaches and no shortness of breath.       Home Medications Prior to Admission medications   Medication Sig Start Date End Date Taking? Authorizing Provider  amoxicillin  (AMOXIL ) 500 MG capsule Take 2 capsules (1,000 mg total) by mouth 3 (three) times daily. 10/26/22   Singh, Prashant K, MD  atorvastatin  (LIPITOR) 40 MG tablet Take 40 mg by mouth daily.    [provider]  carvedilol  (COREG ) 6.25 MG tablet Take 1 tablet (6.25 mg total) by mouth 2 (two) times daily with a meal. 10/25/22   Dennise Lavada POUR, MD  tamsulosin  (FLOMAX ) 0.4 MG CAPS capsule Take 1 capsule (0.4 mg total) by mouth daily. 10/24/22   Singh, Prashant K, MD      Allergies    Oyster shell and Shellfish allergy    Review of Systems   Review of Systems  Constitutional:  Negative for chills and fever.  HENT:  Negative for sore throat.   Eyes:  Negative for visual disturbance.  Respiratory:  Negative for shortness of breath.        Occasional non prod cough  Cardiovascular:  Negative for chest pain.  Gastrointestinal:  Negative for abdominal pain, diarrhea and vomiting.  Genitourinary:  Negative  for dysuria and flank pain.  Musculoskeletal:  Negative for back pain and neck pain.  Skin:  Negative for rash.  Neurological:  Negative for headaches.    Physical Exam Updated Vital Signs BP (!) 152/106 (BP Location: Right Arm)   Pulse 99   Temp 98.1 F (36.7 C) (Oral)   Resp 18   Ht 1.753 m (5' 9)   Wt 68 kg   SpO2 99%   BMI 22.15 kg/m  Physical Exam Vitals and nursing note reviewed.  Constitutional:      Appearance: Normal appearance. He is well-developed.  HENT:     Head: Atraumatic.     Nose: Nose normal.     Mouth/Throat:     Mouth: Mucous membranes are moist.     Pharynx: Oropharynx is clear.  Eyes:     General: No scleral icterus.    Conjunctiva/sclera: Conjunctivae normal.     Pupils: Pupils are equal, round, and reactive to light.  Neck:     Trachea: No tracheal deviation.     Comments: No stiffness or rigidity.  Cardiovascular:     Rate and Rhythm: Normal rate and regular rhythm.     Pulses: Normal pulses.     Heart sounds: Normal heart sounds. No murmur heard.    No friction rub. No gallop.  Pulmonary:     Effort: Pulmonary effort is normal. No accessory muscle usage or respiratory distress.  Breath sounds: Normal breath sounds.  Abdominal:     General: Bowel sounds are normal. There is no distension.     Palpations: Abdomen is soft.     Tenderness: There is no abdominal tenderness.  Genitourinary:    Comments: No cva tenderness. Perineal and buttock and groin area with excoriated areas of skin. No crepitus. +tenderness. Adjacent skin with increased warmth and erythema c/w secondary cellulitis.  Musculoskeletal:        General: No swelling or tenderness.     Cervical back: Normal range of motion and neck supple. No rigidity.  Skin:    General: Skin is warm and dry.     Findings: No rash.  Neurological:     Mental Status: He is alert.     Comments: Alert, speech clear. Moves bilateral extremities purposefully. (Denies new numbness/weakness)   Psychiatric:        Mood and Affect: Mood normal.     ED Results / Procedures / Treatments   Labs (all labs ordered are listed, but only abnormal results are displayed) Results for orders placed or performed during the hospital encounter of 05/25/23  CBC   Collection Time: 05/25/23  6:56 PM  Result Value Ref Range   WBC 11.9 (H) 4.0 - 10.5 K/uL   RBC 4.93 4.22 - 5.81 MIL/uL   Hemoglobin 13.8 13.0 - 17.0 g/dL   HCT 55.2 60.9 - 47.9 %   MCV 90.7 80.0 - 100.0 fL   MCH 28.0 26.0 - 34.0 pg   MCHC 30.9 30.0 - 36.0 g/dL   RDW 86.5 88.4 - 84.4 %   Platelets 262 150 - 400 K/uL   nRBC 0.0 0.0 - 0.2 %  Comprehensive metabolic panel   Collection Time: 05/25/23  6:56 PM  Result Value Ref Range   Sodium 137 135 - 145 mmol/L   Potassium 3.2 (L) 3.5 - 5.1 mmol/L   Chloride 106 98 - 111 mmol/L   CO2 23 22 - 32 mmol/L   Glucose, Bld 104 (H) 70 - 99 mg/dL   BUN 24 (H) 8 - 23 mg/dL   Creatinine, Ser 9.08 0.61 - 1.24 mg/dL   Calcium  8.1 (L) 8.9 - 10.3 mg/dL   Total Protein 6.3 (L) 6.5 - 8.1 g/dL   Albumin 3.1 (L) 3.5 - 5.0 g/dL   AST 20 15 - 41 U/L   ALT 14 0 - 44 U/L   Alkaline Phosphatase 92 38 - 126 U/L   Total Bilirubin 0.7 0.0 - 1.2 mg/dL   GFR, Estimated >39 >39 mL/min   Anion gap 8 5 - 15  Troponin I (High Sensitivity)   Collection Time: 05/25/23  6:56 PM  Result Value Ref Range   Troponin I (High Sensitivity) 36 (H) <18 ng/L  Resp panel by RT-PCR (RSV, Flu A&B, Covid) Anterior Nasal Swab   Collection Time: 05/25/23  7:07 PM   Specimen: Anterior Nasal Swab  Result Value Ref Range   SARS Coronavirus 2 by RT PCR NEGATIVE NEGATIVE   Influenza A by PCR NEGATIVE NEGATIVE   Influenza B by PCR NEGATIVE NEGATIVE   Resp Syncytial Virus by PCR NEGATIVE NEGATIVE   DG Chest Port 1 View Result Date: 05/25/2023 CLINICAL DATA:  Weakness EXAM: PORTABLE CHEST 1 VIEW COMPARISON:  10/23/2022 FINDINGS: 2 frontal views of the chest demonstrate an unremarkable cardiac silhouette. No airspace  disease, effusion, or pneumothorax. No acute bony abnormalities. Ventriculostomy catheter tubing overlies the right chest. IMPRESSION: 1. No acute intrathoracic process. Electronically Signed  By: Ozell Daring M.D.   On: 05/25/2023 20:32   CT Cervical Spine Wo Contrast Result Date: 05/16/2023 CLINICAL DATA:  Poly trauma, blunt due to a fall EXAM: CT CERVICAL SPINE WITHOUT CONTRAST TECHNIQUE: Multidetector CT imaging of the cervical spine was performed without intravenous contrast. Multiplanar CT image reconstructions were also generated. RADIATION DOSE REDUCTION: This exam was performed according to the departmental dose-optimization program which includes automated exposure control, adjustment of the mA and/or kV according to patient size and/or use of iterative reconstruction technique. COMPARISON:  None Available. FINDINGS: Alignment: Normal. Skull base and vertebrae: No acute fracture. No primary bone lesion or focal pathologic process. Soft tissues and spinal canal: No prevertebral fluid or swelling. No visible canal hematoma. Disc levels: Degenerative changes with disc space narrowing and endplate osteophyte formation most prominent at C5-6 and C6-7 levels. Upper chest: Postoperative changes consistent with right carotid endarterectomy. Ventricular shunt tubing is demonstrated in the right side of the neck. Other: None. IMPRESSION: Normal alignment of the cervical spine. No acute displaced fractures are demonstrated. Moderate degenerative changes. Electronically Signed   By: Elsie Gravely M.D.   On: 05/16/2023 23:32   CT Maxillofacial Wo Contrast Result Date: 05/16/2023 CLINICAL DATA:  Blunt facial trauma due to a fall. EXAM: CT MAXILLOFACIAL WITHOUT CONTRAST TECHNIQUE: Multidetector CT imaging of the maxillofacial structures was performed. Multiplanar CT image reconstructions were also generated. RADIATION DOSE REDUCTION: This exam was performed according to the departmental dose-optimization  program which includes automated exposure control, adjustment of the mA and/or kV according to patient size and/or use of iterative reconstruction technique. COMPARISON:  CT head 05/16/2023 FINDINGS: Osseous: No fracture or mandibular dislocation. No destructive process. Orbits: The globes and extraocular muscles appear intact and symmetrical. No retrobulbar infiltration. Sinuses: Paranasal sinuses and mastoid air cells are clear. Soft tissues: No soft tissue swelling, mass, or hematoma. Postoperative changes in the right carotid artery. Limited intracranial: No significant or unexpected finding. IMPRESSION: No acute displaced orbital or facial fractures are identified. Electronically Signed   By: Elsie Gravely M.D.   On: 05/16/2023 23:30   CT Head Wo Contrast Result Date: 05/16/2023 CLINICAL DATA:  Poly trauma, blunt due to a fall. EXAM: CT HEAD WITHOUT CONTRAST TECHNIQUE: Contiguous axial images were obtained from the base of the skull through the vertex without intravenous contrast. RADIATION DOSE REDUCTION: This exam was performed according to the departmental dose-optimization program which includes automated exposure control, adjustment of the mA and/or kV according to patient size and/or use of iterative reconstruction technique. COMPARISON:  MRI brain 11/16/2016.  CT head 11/15/2016 FINDINGS: Brain: Diffuse cerebral atrophy. Ventricular dilatation consistent with central atrophy. Low-attenuation changes in the deep white matter consistent with small vessel ischemia. No abnormal extra-axial fluid collections. No mass effect or midline shift. Gray-white matter junctions are distinct. Basal cisterns are not effaced. No acute intracranial hemorrhage. Right trans frontal ventricular shunt tube with tip in the medial right lateral ventricle. Vascular: No hyperdense vessel or unexpected calcification. Skull: Right frontal burr hole.  No acute depressed skull fractures. Sinuses/Orbits: Paranasal sinuses and  mastoid air cells are clear. Other: None. IMPRESSION: No acute intracranial abnormalities. Chronic atrophy and small vessel ischemic changes. Right trans frontal ventricular shunt tube. Ventricles remain mildly dilated but without change since previous studies. Electronically Signed   By: Elsie Gravely M.D.   On: 05/16/2023 23:28     EKG EKG Interpretation Date/Time:  Thursday May 25 2023 17:37:39 EST Ventricular Rate:  82 PR Interval:  203  QRS Duration:  158 QT Interval:  392 QTC Calculation: 458 R Axis:   54  Text Interpretation: Sinus rhythm Premature ventricular complexes Right bundle branch block Non-specific ST-t changes Artifact Confirmed by Bernard Drivers ([id number]) on 05/25/2023 7:39:45 PM  Radiology DG Chest Port 1 View Result Date: 05/25/2023 CLINICAL DATA:  Weakness EXAM: PORTABLE CHEST 1 VIEW COMPARISON:  10/23/2022 FINDINGS: 2 frontal views of the chest demonstrate an unremarkable cardiac silhouette. No airspace disease, effusion, or pneumothorax. No acute bony abnormalities. Ventriculostomy catheter tubing overlies the right chest. IMPRESSION: 1. No acute intrathoracic process. Electronically Signed   By: Ozell Daring M.D.   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 Labs: ordered. Decision-making details documented in ED Course. Radiology: ordered and independent interpretation performed. Decision-making details documented in ED Course. ECG/medicine tests: ordered and independent interpretation performed. Decision-making details documented in ED Course. Discussion of management or test interpretation with external provider(s): medicine  Risk Prescription drug management. Decision regarding hospitalization.   Iv ns. Continuous pulse ox and cardiac monitoring. Labs ordered/sent. Imaging ordered.   Differential diagnosis includes anemia, dehydration, uti, etc. Dispo decision including potential need for admission considered - will get labs and imaging and reassess.   Reviewed nursing notes and prior charts for additional history. External reports reviewed. Additional history from: EMS.   Cardiac monitor: sinus rhythm, rate 85.  Labs reviewed/interpreted by me - wbc mildly high. Hgb normal. K mildly low. Kcl po.  Iv antibiotics given for cellulitis. Initial trop mildly high - delta trop pending.   Xrays reviewed/interpreted by me - no pna.   Given weakness, cellulitis, FTT, and inability to care for self, will admit. Hospitalists consulted for admission. Pt would benefit from [facility] consult during admission as feel will likely need rehab or other placement once medical issues treated.            Final Clinical Impression(s) / ED Diagnoses Final diagnoses:  None    Rx / DC Orders ED Discharge Orders     None         Bernard Drivers, MD 05/25/23 2142

## 2023-05-25 NOTE — ED Triage Notes (Signed)
 Pt called EMS for BLE lower extremity pain. Pt lives home alone, unable to fully care for himself. Bed bound, pt has hx of stroke.

## 2023-05-25 NOTE — ED Notes (Signed)
 Pt made aware of need for urine and given an urinal

## 2023-05-25 NOTE — H&P (Signed)
 History and Physical    Joel Solis FMW:[id number] DOB: [birth year] DOA: 05/25/2023  [facility]: [facility]  Patient coming from: Home  I have personally briefly reviewed patient's old medical records in [facility]  Chief Complaint: Generalized weakness, pain all over  HPI: Joel Solis is a 78 y.o. male with medical history significant for hemorrhagic stroke with residual left hemiparesis (nonambulatory, uses power wheelchair at baseline), PAD, HTN, HLD, s/p VP shunt who presented to the ED for evaluation of generalized weakness, pain all over, skin infection.  Patient lives alone at home.  He states that he normally uses a power wheelchair for transport.  He says over the last few days he has been feeling too weak to get into his wheelchair and has been essentially stuck in his bed.  He arrived soiled and wet clothing with multiple areas of excoriated and irritated skin including to the perineal area, groin creases, and legs.  He reports shooting pain down his left leg.  He says he has chronic weakness of his left arm and leg due to prior stroke.  ED Course  Labs/Imaging on admission: I have personally reviewed following labs and imaging studies.  Initial vitals showed BP 127/57, pulse 80, RR 18, temp 98.0 F, SpO2 100% on room air.  Labs show WBC 11.9, hemoglobin 13.8, platelets 262,000, sodium 137, potassium 3.2, bicarb 23, BUN 24, creatinine 0.91, serum glucose 104, LFTs within normal limits, troponin 36.  COVID, influenza, RSV PCR negative.  Portable chest x-ray negative for focal consolidation, edema, effusion.  Ventriculostomy catheter tubing overlies the right chest.  Patient was given IV Ancef , oral K 40 mEq.  The hospitalist service was consulted to admit.  Review of Systems: All systems reviewed and are negative except as documented in history of present illness above.   Past Medical History:  Diagnosis Date   Essential hypertension     Hemorrhagic stroke (HCC)    HLD (hyperlipidemia)    S/P VP shunt    Stroke (HCC)    Tobacco abuse     Past Surgical History:  Procedure Laterality Date   cyst      cyst in neck   VENTRICULOPERITONEAL SHUNT      Social History:  reports that he has been smoking cigarettes. He started smoking about 55 years ago. He has a 50 pack-year smoking history. He has never used smokeless tobacco. He reports that he does not currently use alcohol. He reports that he does not use drugs.  Allergies  Allergen Reactions   Oyster Shell Diarrhea, Nausea And Vomiting and Other (See Comments)    GI Intolerance- Pt states he is allergic to the whole oyster, clams and mussels   Shellfish Allergy Diarrhea and Nausea And Vomiting    Family History  Problem Relation Age of Onset   Intracerebral hemorrhage Mother    Lung cancer Father    Esophageal cancer Brother      Prior to Admission medications   Medication Sig Start Date End Date Taking? Authorizing Provider  amoxicillin  (AMOXIL ) 500 MG capsule Take 2 capsules (1,000 mg total) by mouth 3 (three) times daily. 10/26/22   Singh, Prashant K, MD  atorvastatin  (LIPITOR) 40 MG tablet Take 40 mg by mouth daily.    [provider]  carvedilol  (COREG ) 6.25 MG tablet Take 1 tablet (6.25 mg total) by mouth 2 (two) times daily with a meal. 10/25/22   Dennise Lavada POUR, MD  tamsulosin  (FLOMAX ) 0.4 MG CAPS capsule Take  1 capsule (0.4 mg total) by mouth daily. 10/24/22   Dennise Lavada POUR, MD    Physical Exam: Vitals:   05/25/23 1717 05/25/23 1735 05/25/23 1845 05/25/23 2133  BP:  (!) 127/57 (!) 162/92 (!) 152/106  Pulse:  85 83 99  Resp:  18 18 18   Temp:  98 F (36.7 C)  98.1 F (36.7 C)  TempSrc:  Oral  Oral  SpO2:  100% 96% 99%  Weight: 68.1 kg 68 kg    Height: 5' 9 (1.753 m) 5' 9 (1.753 m)     Constitutional: Chronically ill-appearing man resting in bed, NAD Eyes: EOMI, lids and conjunctivae normal ENMT: Mucous membranes are moist.  Posterior pharynx clear of any exudate or lesions.Normal dentition.  Neck: normal, supple, no masses. Respiratory: clear to auscultation and serially. Normal respiratory effort. No accessory muscle use.  Cardiovascular: Regular rate and rhythm, no murmurs / rubs / gallops. No extremity edema. 2+ pedal pulses. Abdomen: no tenderness, no masses palpated. Musculoskeletal: no clubbing / cyanosis. No joint deformity upper and lower extremities.  ROM diminished left upper and lower extremities due to chronic hemiparesis from prior stroke Skin: Erythema with cellulitic appearance to left groin, no open wound or active discharge in the area.  Erythema to left anterior foot. Neurologic: Sensation intact. Strength 5/5 in RUE and RLE.  Strength 0/5 LUE and 2/5 LLE, unchanged from baseline per patient. Psychiatric: Alert and oriented x 3.  EKG: Personally reviewed. Sinus rhythm, rate 82, PVCs present, no acute ischemic changes.  Assessment/Plan Principal Problem:   Cellulitis of groin, left Active Problems:   Hypokalemia   Essential hypertension   HLD (hyperlipidemia)   History of hemorrhagic cerebrovascular accident (CVA) with residual deficit   Failure to thrive in adult   Joel Solis is a 78 y.o. male with medical history significant for hemorrhagic stroke with residual left hemiparesis (nonambulatory, uses power wheelchair at baseline), PAD, HTN, HLD, s/p VP shunt who is admitted with cellulitis involving left groin and left foot.  Assessment and Plan: Cellulitis of left groin and left foot: Cellulitic appearance of the left groin and left anterior foot.  WBC is 11.9. -Continue IV Ancef   Failure to thrive: Poor functional status at baseline with worsening over the last few days.  Unable to get into power wheelchair and has been stuck in bed swelling self.  Unable to complete ADLs.  Lives alone.  Likely needs SNF placement.  Of note patient was recommended SNF on prior admission in June but he  refused placement. -Consult to PT/OT/TOC  Hypokalemia: Oral supplement ordered.  History of hemorrhagic stroke with residual left hemiparesis Nonambulatory - uses power wheelchair at baseline: Continue aspirin  and statin.  Hypertension: Continue lisinopril , Lopressor , amlodipine .  Hyperlipidemia: Continue atorvastatin .   DVT prophylaxis: enoxaparin  (LOVENOX ) injection 40 mg Start: 05/26/23 1000 Code Status: Full code, discussed with patient on admission Family Communication: Discussed with patient, he has discussed with family Disposition Plan: From home, dispo pending clinical progress Consults called: None Severity of Illness: The appropriate patient status for this patient is OBSERVATION. Observation status is judged to be reasonable and necessary in order to provide the required intensity of service to ensure the patient's safety. The patient's presenting symptoms, physical exam findings, and initial radiographic and laboratory data in the context of their medical condition is felt to place them at decreased risk for further clinical deterioration. Furthermore, it is anticipated that the patient will be medically stable for discharge from the hospital within 2 midnights  of admission.   Jorie Blanch MD [facility]  If 7PM-7AM, please contact night-coverage [email or web address]  05/25/2023, 11:28 PM

## 2023-05-26 DIAGNOSIS — I69254 Hemiplegia and hemiparesis following other nontraumatic intracranial hemorrhage affecting left non-dominant side: Secondary | ICD-10-CM | POA: Diagnosis not present

## 2023-05-26 DIAGNOSIS — I739 Peripheral vascular disease, unspecified: Secondary | ICD-10-CM | POA: Diagnosis present

## 2023-05-26 DIAGNOSIS — L03314 Cellulitis of groin: Secondary | ICD-10-CM | POA: Diagnosis present

## 2023-05-26 DIAGNOSIS — E785 Hyperlipidemia, unspecified: Secondary | ICD-10-CM | POA: Diagnosis present

## 2023-05-26 DIAGNOSIS — Z751 Person awaiting admission to adequate facility elsewhere: Secondary | ICD-10-CM | POA: Diagnosis not present

## 2023-05-26 DIAGNOSIS — Z602 Problems related to living alone: Secondary | ICD-10-CM | POA: Diagnosis present

## 2023-05-26 DIAGNOSIS — Z91013 Allergy to seafood: Secondary | ICD-10-CM | POA: Diagnosis not present

## 2023-05-26 DIAGNOSIS — E876 Hypokalemia: Secondary | ICD-10-CM | POA: Diagnosis present

## 2023-05-26 DIAGNOSIS — Z993 Dependence on wheelchair: Secondary | ICD-10-CM | POA: Diagnosis not present

## 2023-05-26 DIAGNOSIS — E8809 Other disorders of plasma-protein metabolism, not elsewhere classified: Secondary | ICD-10-CM | POA: Diagnosis present

## 2023-05-26 DIAGNOSIS — Z801 Family history of malignant neoplasm of trachea, bronchus and lung: Secondary | ICD-10-CM | POA: Diagnosis not present

## 2023-05-26 DIAGNOSIS — M24542 Contracture, left hand: Secondary | ICD-10-CM | POA: Diagnosis present

## 2023-05-26 DIAGNOSIS — Z79899 Other long term (current) drug therapy: Secondary | ICD-10-CM | POA: Diagnosis not present

## 2023-05-26 DIAGNOSIS — R627 Adult failure to thrive: Secondary | ICD-10-CM | POA: Diagnosis present

## 2023-05-26 DIAGNOSIS — F17210 Nicotine dependence, cigarettes, uncomplicated: Secondary | ICD-10-CM | POA: Diagnosis present

## 2023-05-26 DIAGNOSIS — Z1152 Encounter for screening for COVID-19: Secondary | ICD-10-CM | POA: Diagnosis not present

## 2023-05-26 DIAGNOSIS — L03116 Cellulitis of left lower limb: Secondary | ICD-10-CM | POA: Diagnosis present

## 2023-05-26 DIAGNOSIS — I10 Essential (primary) hypertension: Secondary | ICD-10-CM | POA: Diagnosis present

## 2023-05-26 DIAGNOSIS — Z800 Family history of malignant neoplasm of digestive organs: Secondary | ICD-10-CM | POA: Diagnosis not present

## 2023-05-26 DIAGNOSIS — Z982 Presence of cerebrospinal fluid drainage device: Secondary | ICD-10-CM | POA: Diagnosis not present

## 2023-05-26 LAB — CBC
HCT: 43.4 % (ref 39.0–52.0)
Hemoglobin: 13.8 g/dL (ref 13.0–17.0)
MCH: 28.7 pg (ref 26.0–34.0)
MCHC: 31.8 g/dL (ref 30.0–36.0)
MCV: 90.2 fL (ref 80.0–100.0)
Platelets: 271 10*3/uL (ref 150–400)
RBC: 4.81 MIL/uL (ref 4.22–5.81)
RDW: 13.5 % (ref 11.5–15.5)
WBC: 10.1 10*3/uL (ref 4.0–10.5)
nRBC: 0 % (ref 0.0–0.2)

## 2023-05-26 LAB — BASIC METABOLIC PANEL
Anion gap: 6 (ref 5–15)
BUN: 22 mg/dL (ref 8–23)
CO2: 25 mmol/L (ref 22–32)
Calcium: 8.8 mg/dL — ABNORMAL LOW (ref 8.9–10.3)
Chloride: 110 mmol/L (ref 98–111)
Creatinine, Ser: 0.74 mg/dL (ref 0.61–1.24)
GFR, Estimated: >60 mL/min (ref >60)
Glucose, Bld: 85 mg/dL (ref 70–99)
Potassium: 3.8 mmol/L (ref 3.5–5.1)
Sodium: 141 mmol/L (ref 135–145)

## 2023-05-26 LAB — URINALYSIS, ROUTINE W REFLEX MICROSCOPIC
Bilirubin Urine: NEGATIVE
Glucose, UA: NEGATIVE mg/dL
Hgb urine dipstick: NEGATIVE
Ketones, ur: NEGATIVE mg/dL
Nitrite: NEGATIVE
Protein, ur: NEGATIVE mg/dL
Specific Gravity, Urine: 1.013 (ref 1.005–1.030)
pH: 8 (ref 5.0–8.0)

## 2023-05-26 MED ORDER — TRAMADOL HCL 50 MG PO TABS
50.0000 mg | ORAL_TABLET | Freq: Four times a day (QID) | ORAL | Status: AC | PRN
  Administered 2023-05-26 – 2023-05-27 (×2): 50 mg via ORAL
  Filled 2023-05-26 (×2): qty 1

## 2023-05-26 NOTE — Progress Notes (Signed)
 PROGRESS NOTE    Joel Solis  FMW:[id number] DOB: [birth year] DOA: 05/25/2023 [facility]: [facility]    Brief Narrative:   Joel Solis is a 78 y.o. male with medical history significant for hemorrhagic stroke with residual left hemiparesis (nonambulatory, uses power wheelchair at baseline), PAD, HTN, HLD, s/p VP shunt  presented to the ED for evaluation of generalized weakness, pain all over, skin infection.  Patient currently lives alone at home and has a wheelchair for ambulation but has been getting weaker even to get in his wheelchair now.  In the ED patient had multiple areas of excoriation especially around the perineal area and legs and complained of shooting pain down the legs.  In the ED vitals were stable.  Lab was notable for potassium low at 3.2.  COVID influenza and RSV was negative.  Chest x-ray was negative for infiltrate.  Patient was given IV Ancef  and potassium and was admitted to hospital with working diagnosis of cellulitis involving left groin and left foot.  Assessment and Plan:  Cellulitis of left groin and left foot: WBC mildly elevated on presentation which has normalized at this time..  Continue IV Ancef .  Urinalysis was negative.  Will continue to monitor closely.  Is afebrile.   Failure to thrive/ Progressive weakness. Poor functional status at baseline with worsening over the last few days.  Lives alone and has a wheelchair at home which he has not been able to use.  Lives with cat..  Might need rehabilitation/skilled nursing facility placement.  Will get PT OT evaluation.   Hypokalemia: Improved after replacement.  Latest potassium of 3.8.   History of hemorrhagic stroke with residual left hemiparesis Nonambulatory - uses power wheelchair at baseline: Continue aspirin  and statin.  Might need rehabilitation at a skilled nursing facility level.   Hypertension: Continue lisinopril , Lopressor , amlodipine .   Hyperlipidemia: Continue  atorvastatin .    DVT prophylaxis: enoxaparin  (LOVENOX ) injection 40 mg Start: 05/26/23 1000   Code Status:     Code Status: Full Code  Disposition: Uncertain at this time.  Will get PT OT evaluation.  Status is: Observation  The patient will require care spanning > 2 midnights and should be moved to inpatient because: Cellulitis, IV antibiotic, disposition plan, PT evaluation.   Family Communication: None at bedside  Consultants:  None  Procedures:  None  Antimicrobials:  Ancef  IV  Anti-infectives (From admission, onward)    Start     Dose/Rate Route Frequency Ordered Stop   05/26/23 0000  ceFAZolin  (ANCEF ) IVPB 1 g/50 mL premix        1 g 100 mL/hr over 30 Minutes Intravenous Every 8 hours 05/25/23 2320 06/01/23 2359   05/25/23 2145  ceFAZolin  (ANCEF ) IVPB 1 g/50 mL premix  Status:  Discontinued        1 g 100 mL/hr over 30 Minutes Intravenous  Once 05/25/23 2143 05/25/23 2320      Subjective: Today, patient was seen and examined at bedside.  Patient complains of pain and discomfort in the left leg and groin area.  Denies any fever, chills or rigor.  Objective: Vitals:   05/26/23 0945 05/26/23 1015 05/26/23 1019 05/26/23 1215  BP: 130/79 132/65  115/63  Pulse:  80  72  Resp:  17  17  Temp:   98.1 F (36.7 C)   TempSrc:   Oral   SpO2:  91%  92%  Weight:      Height:        Intake/Output  Summary (Last 24 hours) at 05/26/2023 1349 Last data filed at 05/26/2023 0012 Gross per 24 hour  Intake 50 ml  Output --  Net 50 ml   Filed Weights   05/25/23 1717 05/25/23 1735  Weight: 68.1 kg 68 kg    Physical Examination: Body mass index is 22.15 kg/m.   General:  Average built, not in obvious distress, Communicative, appears chronically ill and deconditioned.  Overall poor hygiene. HENT:   No scleral pallor or icterus noted. Oral mucosa is moist.  Chest:   Diminished breath sounds bilaterally. No crackles or wheezes.  CVS: S1 &S2 heard. No murmur.  Regular rate  and rhythm. Abdomen: Soft, nontender, nondistended.  Bowel sounds are heard.   Extremities: Left leg with edema erythema and tenderness on palpation with multiple areas of skin breakdown scabs including redness of the groin. Psych: Alert, awake and oriented, normal mood CNS:  No cranial nerve deficits.  Left-sided weakness noted.  Chronic hemiparesis on the left. Skin: Warm and dry.  No rashes noted.  Data Reviewed:   CBC: Recent Labs  Lab 05/25/23 1856 05/26/23 0555  WBC 11.9* 10.1  HGB 13.8 13.8  HCT 44.7 43.4  MCV 90.7 90.2  PLT 262 271    Basic Metabolic Panel: Recent Labs  Lab 05/25/23 1856 05/26/23 0555  NA 137 141  K 3.2* 3.8  CL 106 110  CO2 23 25  GLUCOSE 104* 85  BUN 24* 22  CREATININE 0.91 0.74  CALCIUM  8.1* 8.8*    Liver Function Tests: Recent Labs  Lab 05/25/23 1856  AST 20  ALT 14  ALKPHOS 92  BILITOT 0.7  PROT 6.3*  ALBUMIN 3.1*     Radiology Studies: DG Chest Port 1 View Result Date: 05/25/2023 CLINICAL DATA:  Weakness EXAM: PORTABLE CHEST 1 VIEW COMPARISON:  10/23/2022 FINDINGS: 2 frontal views of the chest demonstrate an unremarkable cardiac silhouette. No airspace disease, effusion, or pneumothorax. No acute bony abnormalities. Ventriculostomy catheter tubing overlies the right chest. IMPRESSION: 1. No acute intrathoracic process. Electronically Signed   By: Ozell Daring M.D.   On: 05/25/2023 20:32      LOS: 0 days   Vernal Alstrom, MD [facility] Available via Epic secure chat 7am-7pm After these hours, please refer to coverage provider listed on [email or web address] 05/26/2023, 1:49 PM

## 2023-05-26 NOTE — Progress Notes (Signed)
 PT Cancellation Note  Patient Details Name: Joel Solis MRN: [id number] DOB: [birth year]   Cancelled Treatment:     Pt has recently arrived to unit when I was able to check on him. He was very hungry and eagerly eating his dinner at this time. I will check back if time allows today , if not will assess pt in the morning    Isabelle Matt, [facility] 05/26/2023, 4:59 PM Cloretta, PT, MPT Acute Rehabilitation Services Office: [phone] If a weekend: secure chat groups: [facility] PT, [facility] OT, [facility] SLP 05/26/2023

## 2023-05-27 DIAGNOSIS — L03314 Cellulitis of groin: Secondary | ICD-10-CM | POA: Diagnosis not present

## 2023-05-27 LAB — MAGNESIUM: Magnesium: 2.2 mg/dL (ref 1.7–2.4)

## 2023-05-27 LAB — CBC
HCT: 46 % (ref 39.0–52.0)
Hemoglobin: 14.4 g/dL (ref 13.0–17.0)
MCH: 28.6 pg (ref 26.0–34.0)
MCHC: 31.3 g/dL (ref 30.0–36.0)
MCV: 91.3 fL (ref 80.0–100.0)
Platelets: 292 10*3/uL (ref 150–400)
RBC: 5.04 MIL/uL (ref 4.22–5.81)
RDW: 13.5 % (ref 11.5–15.5)
WBC: 9.8 10*3/uL (ref 4.0–10.5)
nRBC: 0 % (ref 0.0–0.2)

## 2023-05-27 LAB — BASIC METABOLIC PANEL
Anion gap: 7 (ref 5–15)
BUN: 25 mg/dL — ABNORMAL HIGH (ref 8–23)
CO2: 26 mmol/L (ref 22–32)
Calcium: 8.6 mg/dL — ABNORMAL LOW (ref 8.9–10.3)
Chloride: 104 mmol/L (ref 98–111)
Creatinine, Ser: 0.88 mg/dL (ref 0.61–1.24)
GFR, Estimated: >60 mL/min (ref >60)
Glucose, Bld: 78 mg/dL (ref 70–99)
Potassium: 3.5 mmol/L (ref 3.5–5.1)
Sodium: 137 mmol/L (ref 135–145)

## 2023-05-27 MED ORDER — TRAMADOL HCL 50 MG PO TABS
50.0000 mg | ORAL_TABLET | Freq: Four times a day (QID) | ORAL | Status: AC | PRN
Start: 2023-05-27 — End: 2023-05-28
  Administered 2023-05-27: 50 mg via ORAL
  Filled 2023-05-27: qty 1

## 2023-05-27 NOTE — Evaluation (Signed)
 Occupational Therapy Evaluation Patient Details Name: Joel Solis MRN: [id number] DOB: [birth year] Today's Date: 05/27/2023   History of Present Illness 78 y.o. male with presented to the ED for evaluation of generalized weakness, pain all over, skin infection, admitted with cellulitis. PMH: hemorrhagic stroke with residual left hemiparesis (nonambulatory, uses power wheelchair at baseline), PAD, HTN, HLD, s/p VP shunt who   Clinical Impression   Patient is a 78 year old male who was admitted for above. Patient was living at home alone with cat and caregiver support 3x a week. Patient was noted to have contracture of L arm with concerns over skin integrity of L hand with patient declining for therapist to attempt PROM to visualize area to ensure nails had not punctured skin. Patient was unable to recall when the last time he moved digits PROM was.concerns were voiced to nurse at end of session.  Patient would need 24/7 caregiver support in next level of care to be most successful. Patient would continue to benefit from skilled OT services at this time while admitted and after d/c to address noted deficits in order to improve overall safety and independence in ADLs. Patient will benefit from continued inpatient follow up therapy, <3 hours/day        If plan is discharge home, recommend the following: Two people to help with walking and/or transfers;A lot of help with bathing/dressing/bathroom;Assistance with cooking/housework;Direct supervision/assist for medications management;Direct supervision/assist for financial management;Help with stairs or ramp for entrance;Assist for transportation    Functional Status Assessment  Patient has had a recent decline in their functional status and demonstrates the ability to make significant improvements in function in a reasonable and predictable amount of time.  Equipment Recommendations  None recommended by OT       Precautions / Restrictions  Precautions Precautions: Fall Precaution Comments: L side weakness/contractures Restrictions Weight Bearing Restrictions Per Provider Order: No      Mobility Bed Mobility Overal bed mobility: Needs Assistance Bed Mobility: Supine to Sit, Sit to Supine     Supine to sit: Total assist Sit to supine: Total assist   General bed mobility comments: patient is used to using pole to assist with bed mobility. patient was unable to tolerate touch to L side impacting advancing to EOB          Balance Overall balance assessment: Needs assistance Sitting-balance support: Single extremity supported, Feet supported Sitting balance-Leahy Scale: Fair         ADL either performed or assessed with clinical judgement   ADL Overall ADL's : Needs assistance/impaired Eating/Feeding: Set up;Supervision/ safety Eating/Feeding Details (indicate cue type and reason): noted to have spillage on self from meal at bed level completed prior to arrival Grooming: Sitting;Wash/dry face Grooming Details (indicate cue type and reason): increased time with RUE Upper Body Bathing: Moderate assistance;Bed level   Lower Body Bathing: Bed level;Total assistance   Upper Body Dressing : Bed level;Maximal assistance Upper Body Dressing Details (indicate cue type and reason): extension and adduction of shoulder on L side makes it hard to complete changing gown. Lower Body Dressing: Total assistance;Bed level     Toilet Transfer Details (indicate cue type and reason): unable to attempt transfer with TD to advance to EOB with patien reproting having a pole at home next to bed that he uses to transition in and out of powerchair. Toileting- Clothing Manipulation and Hygiene: Bed level;Total assistance         General ADL Comments: patient reported it doesnt matter if  i lay in a mess when cleaning patient up from coffee spill. patient was educated on concerns for skin integrity and sanitation needing bed to be  clean. patietn was +2 for movement in bed to change linens.     Vision Patient Visual Report: No change from baseline              Pertinent Vitals/Pain Pain Assessment Pain Assessment: 0-10 Pain Score: 7  Pain Location: all over Pain Descriptors / Indicators: Other (Comment) (steady pain) Pain Intervention(s): Limited activity within patient's tolerance, Monitored during session     Extremity/Trunk Assessment Upper Extremity Assessment Upper Extremity Assessment: LUE deficits/detail LUE Deficits / Details: patient contracted in extension at elbow, did not tolerate attepted PROM. hand is stick in fisted posture. patients index finger is flexed at MCPs and in extension almost hyper extension in DIPs. patient noted to have moisture in hand and unable to visualize to see if skin is impacted with nails with patient in pain with attempts. patient reported  your other coworker looked at it none have assessed this patient since last year at this time. nurse made aware of skin breakdown concerns. LUE: Unable to fully assess due to pain;Shoulder pain with ROM LUE Coordination: decreased fine motor;decreased gross motor   Lower Extremity Assessment Lower Extremity Assessment: Defer to PT evaluation (noted to have B knees in flexion with limited movement)   Cervical / Trunk Assessment Cervical / Trunk Assessment: Kyphotic      Cognition Arousal: Alert Behavior During Therapy: WFL for tasks assessed/performed         General Comments: patient was oriented to self, knew it was a hospital but not the name, asked if it was [facility] to [facility] siad its arund the 7th when asked date, jan 2025                Home Living Family/patient expects to be discharged to:: Private residence Living Arrangements: Alone Available Help at Discharge: Personal care attendant;Friend(s);Available PRN/intermittently (3 days a week) Type of Home: Apartment Home Access: Level entry     Home  Layout: One level     Bathroom Shower/Tub: Sponge bathes at baseline         Home Equipment: Hospital bed;Wheelchair - power;Other (comment)   Additional Comments: transfer pole in bedroom      Prior Functioning/Environment Prior Level of Function : Needs assist             Mobility Comments: has caregiver 3 x per week ADLs Comments: uses bedpan for bathroom and has assistance for washups from caregiver. Pt reports able to wash up some if needed when caregiver not present.        OT Problem List: Decreased activity tolerance;Decreased cognition;Impaired UE functional use;Pain;Decreased knowledge of precautions;Impaired balance (sitting and/or standing);Decreased coordination;Decreased safety awareness;Decreased range of motion      OT Treatment/Interventions: Self-care/ADL training;Therapeutic exercise;DME and/or AE instruction;Therapeutic activities;Patient/family education;Balance training    OT Goals(Current goals can be found in the care plan section) Acute Rehab OT Goals Patient Stated Goal: to go home OT Goal Formulation: With patient Time For Goal Achievement: 06/10/23 Potential to Achieve Goals: Fair  OT Frequency: Min 1X/week       AM-PAC OT 6 Clicks Daily Activity     Outcome Measure Help from another person eating meals?: A Little Help from another person taking care of personal grooming?: A Little Help from another person toileting, which includes using toliet, bedpan, or urinal?: Total Help from another person  bathing (including washing, rinsing, drying)?: Total Help from another person to put on and taking off regular upper body clothing?: A Lot Help from another person to put on and taking off regular lower body clothing?: Total 6 Click Score: 11   End of Session Nurse Communication: Mobility status;Other (comment) (concerns over moisture and poor visualization of L hand increased risk for skin breakdown)  Activity Tolerance: Patient limited by  pain Patient left: in bed;with call bell/phone within reach;with bed alarm set  OT Visit Diagnosis: Unsteadiness on feet (R26.81);Other abnormalities of gait and mobility (R26.89);Muscle weakness (generalized) (M62.81);Pain                Time: 8957-8894 OT Time Calculation (min): 23 min Charges:  OT General Charges $OT Visit: 1 Visit OT Evaluation $OT Eval Low Complexity: 1 Low OT Treatments $Self Care/Home Management : 8-22 mins  Geofm LEYLAND, MS Acute Rehabilitation Department Office# [phone]   Geofm CHRISTELLA Dance 05/27/2023, 12:05 PM

## 2023-05-27 NOTE — Plan of Care (Signed)
 ?  Problem: Nutrition: ?Goal: Adequate nutrition will be maintained ?Outcome: Progressing ?  ?Problem: Coping: ?Goal: Level of anxiety will decrease ?Outcome: Progressing ?  ?Problem: Elimination: ?Goal: Will not experience complications related to urinary retention ?Outcome: Progressing ?  ?

## 2023-05-27 NOTE — Evaluation (Addendum)
 Physical Therapy Evaluation Patient Details Name: Joel Solis MRN: [id number] DOB: 12-15-1945 Today's Date: 05/27/2023  History of Present Illness  78 y.o. male with presented to the ED for evaluation of generalized weakness, pain all over, skin infection, admitted with cellulitis. PMH: hemorrhagic stroke with residual left hemiparesis (nonambulatory, uses power wheelchair at baseline), PAD, HTN, HLD, s/p VP shunt who  Clinical Impression  Pt admitted with above diagnosis.  Pt reports recent decline in baseline mobility; pt was  able to transfer to w/c using R UE/LE and pole  until the last few weeks, eats meals in bed,  typically spends a better part of day in bed. Pt mobility is  limited by pain L side and deconditioning.  Pt was positioned in s/l with pillow between LEs to prevent pressure over boney prominences. Assisted pt with cutting up food and he was then able to feed self.  Patient will benefit from continued follow up therapy, <3 hours/day at d/c if agreeable.    Pt currently with functional limitations due to the deficits listed below (see PT Problem List). Pt will benefit from acute skilled PT to increase their independence and safety with mobility to allow discharge.           If plan is discharge home, recommend the following: Two people to help with walking and/or transfers;Two people to help with bathing/dressing/bathroom;Help with stairs or ramp for entrance;Assist for transportation;Assistance with cooking/housework   Can travel by private vehicle   No    Equipment Recommendations None recommended by PT  Recommendations for Other Services       Functional Status Assessment Patient has had a recent decline in their functional status and demonstrates the ability to make significant improvements in function in a reasonable and predictable amount of time.     Precautions / Restrictions Precautions Precautions: Fall Precaution Comments: L side  weakness/contractures Restrictions Weight Bearing Restrictions Per Provider Order: No      Mobility  Bed Mobility Overal bed mobility: Needs Assistance Bed Mobility: Supine to Sit, Sit to Supine     Supine to sit: Total assist Sit to supine: Total assist   General bed mobility comments: movement limited by pain L UE/LE; placed pillow between LEs to prevent pressure over boney prominences with pt in L sidelying (pt agreeable to this)    Transfers                        Ambulation/Gait                  Stairs            Wheelchair Mobility     Tilt Bed    Modified Rankin (Stroke Patients Only)       Balance     Sitting balance-Leahy Scale: Fair                                       Pertinent Vitals/Pain Pain Assessment Pain Assessment: 0-10 Pain Score: 7  Pain Location: all over Pain Descriptors / Indicators: Constant Pain Intervention(s): Monitored during session, Limited activity within patient's tolerance    Home Living Family/patient expects to be discharged to:: Private residence Living Arrangements: Alone Available Help at Discharge: Personal care attendant;Friend(s);Available PRN/intermittently Type of Home: Apartment Home Access: Level entry       Home Layout: One level Home Equipment: Hospital bed;Wheelchair -  power;Other (comment) Additional Comments: transfer pole in bedroom    Prior Function Prior Level of Function : Needs assist             Mobility Comments: has caregiver 3 x per week; until recently was able to put wt on RLE and use pole to transfer to chair. pt reports to PT that  his caregiver is coming more often own her on ADLs Comments: uses bedpan for bathroom and has assistance for washups from caregiver. Pt reports able to wash up some if needed when caregiver not present.     Extremity/Trunk Assessment   Upper Extremity Assessment Upper Extremity Assessment: Defer to OT  evaluation LUE Deficits / Details: patient contracted in extension at elbow, did not tolerate attepted PROM. hand is stick in fisted posture. patients index finger is flexed at MCPs and in extension almost hyper extension in DIPs. patient noted to have moisture in hand and unable to visualize to see if skin is impacted with nails with patient in pain with attempts. patient reported  your other coworker looked at it none have assessed this patient since last year at this time. nurse made aware of skin breakdown concerns. LUE: Unable to fully assess due to pain;Shoulder pain with ROM LUE Coordination: decreased fine motor;decreased gross motor    Lower Extremity Assessment Lower Extremity Assessment: RLE deficits/detail;LLE deficits/detail RLE Deficits / Details: grossly 3+/5 strength testing limited by pain L LE with movement on R LLE Deficits / Details: contracted in knee and hip flexion; does not tolerate ROM d/t pain LLE Coordination: decreased fine motor;decreased gross motor    Cervical / Trunk Assessment Cervical / Trunk Assessment: Kyphotic  Communication   Communication Communication: No apparent difficulties  Cognition Arousal: Alert Behavior During Therapy: WFL for tasks assessed/performed Overall Cognitive Status: Within Functional Limits for tasks assessed                                 General Comments: knows he is in hospital, unsure of name of hospital        General Comments      Exercises     Assessment/Plan    PT Assessment Patient needs continued PT services  PT Problem List Decreased strength;Decreased range of motion;Decreased activity tolerance;Decreased balance;Decreased mobility;Pain       PT Treatment Interventions Functional mobility training;Therapeutic activities;Therapeutic exercise;Patient/family education    PT Goals (Current goals can be found in the Care Plan section)  Acute Rehab PT Goals Patient Stated Goal: mellowing to  idea of SNF PT Goal Formulation: With patient Time For Goal Achievement: 06/10/23 Potential to Achieve Goals: Fair    Frequency Min 1X/week     Co-evaluation               AM-PAC PT 6 Clicks Mobility  Outcome Measure Help needed turning from your back to your side while in a flat bed without using bedrails?: Total Help needed moving from lying on your back to sitting on the side of a flat bed without using bedrails?: Total Help needed moving to and from a bed to a chair (including a wheelchair)?: Total Help needed standing up from a chair using your arms (e.g., wheelchair or bedside chair)?: Total Help needed to walk in hospital room?: Total Help needed climbing 3-5 steps with a railing? : Total 6 Click Score: 6    End of Session   Activity Tolerance: Patient tolerated treatment well  Patient left: in bed;with call bell/phone within reach;with bed alarm set   PT Visit Diagnosis: Hemiplegia and hemiparesis;Other abnormalities of gait and mobility (R26.89) Hemiplegia - Right/Left: Left Hemiplegia - caused by: Cerebral infarction    Time: 8755-8743 PT Time Calculation (min) (ACUTE ONLY): 12 min   Charges:   PT Evaluation $PT Eval Low Complexity: 1 Low   PT General Charges $$ ACUTE PT VISIT: 1 Visit         Pavneet Markwood, PT  Acute Rehab Dept [facility]) [phone]  05/27/2023   [facility] 05/27/2023, 1:34 PM

## 2023-05-27 NOTE — Progress Notes (Signed)
 PROGRESS NOTE    Joel Solis  FMW:[id number] DOB: August 25, 1945 DOA: 05/25/2023 PCP: [facility]    Brief Narrative:   Stepen Prins is a 78 y.o. male with medical history significant for hemorrhagic stroke with residual left hemiparesis (nonambulatory, uses power wheelchair at baseline), PAD, HTN, HLD, s/p VP shunt  presented to the ED for evaluation of generalized weakness, pain all over, skin infection.  Patient currently lives alone at home and has a wheelchair for ambulation but has been getting weaker even to get in his wheelchair now.  In the ED patient had multiple areas of excoriation especially around the perineal area and legs and complained of shooting pain down the legs.  In the ED vitals were stable.  Lab was notable for potassium low at 3.2.  COVID influenza and RSV was negative.  Chest x-ray was negative for infiltrate.  Patient was given IV Ancef  and potassium and was admitted to hospital with working diagnosis of cellulitis involving left groin and left foot.  Assessment and Plan:  Cellulitis of left groin and left foot: WBC mildly elevated on presentation which has normalized at this time..  Continue IV Ancef .  Urinalysis was negative.  Will continue to monitor closely.  Patient is afebrile.  Still complains of foot pain.   Failure to thrive/ Progressive weakness. Poor functional status at baseline with worsening over the last few days.  Lives alone and has a wheelchair at home which he has not been able to use.   Might need rehabilitation/skilled nursing facility placement.  Will get PT OT evaluation, pending.   Hypokalemia: Improved after replacement.  Latest potassium of 3.5   History of hemorrhagic stroke with residual left hemiparesis Nonambulatory - uses power wheelchair at baseline: Continue aspirin  and statin.  Might need rehabilitation at a skilled nursing facility level.  PT evaluation pending.   Hypertension: Continue lisinopril ,  Lopressor , amlodipine .   Hyperlipidemia: Continue atorvastatin .    DVT prophylaxis: enoxaparin  (LOVENOX ) injection 40 mg Start: 05/26/23 1000   Code Status:     Code Status: Full Code  Disposition: Uncertain at this time.  PT OT evaluation pending.  Status is: .Inpatient.  The patient is inpatient because: Cellulitis, IV antibiotic, disposition plan, PT evaluation.   Family Communication: None at bedside  Consultants:  None  Procedures:  None  Antimicrobials:  Ancef  IV  Anti-infectives (From admission, onward)    Start     Dose/Rate Route Frequency Ordered Stop   05/26/23 0000  ceFAZolin  (ANCEF ) IVPB 1 g/50 mL premix        1 g 100 mL/hr over 30 Minutes Intravenous Every 8 hours 05/25/23 2320 06/01/23 2359   05/25/23 2145  ceFAZolin  (ANCEF ) IVPB 1 g/50 mL premix  Status:  Discontinued        1 g 100 mL/hr over 30 Minutes Intravenous  Once 05/25/23 2143 05/25/23 2320      Subjective: Today, patient was seen and examined at bedside.  Complains of left heel pain left groin and left leg pain.  Denies any nausea vomiting fever chills or rigor.  Objective: Vitals:   05/27/23 0108 05/27/23 0459 05/27/23 0923 05/27/23 0927  BP: 137/71 (!) 146/85  135/74  Pulse: 61 (!) 50 72 61  Resp: 16 16  18   Temp: 97.8 F (36.6 C) 98.4 F (36.9 C)  (!) 97.5 F (36.4 C)  TempSrc: Oral   Oral  SpO2: 99% 97%  99%  Weight:      Height:  Intake/Output Summary (Last 24 hours) at 05/27/2023 1134 Last data filed at 05/27/2023 1000 Gross per 24 hour  Intake 1645 ml  Output 0 ml  Net 1645 ml   Filed Weights   05/25/23 1717 05/25/23 1735  Weight: 68.1 kg 68 kg    Physical Examination: Body mass index is 22.15 kg/m.   General:  Average built, not in obvious distress, Communicative, appears chronically ill and deconditioned.  Overall poor hygiene.  Elderly gentleman HENT:   No scleral pallor or icterus noted. Oral mucosa is moist.  Chest:   Diminished breath sounds  bilaterally. No crackles or wheezes.  CVS: S1 &S2 heard. No murmur.  Regular rate and rhythm. Abdomen: Soft, nontender, nondistended.  Bowel sounds are heard.   Extremities: Left leg with edema erythema and tenderness on palpation with multiple areas of skin breakdown scabs,  left hemiparesis with contracture. Psych: Alert, awake and oriented, normal mood CNS:  No cranial nerve deficits.  Left hemiparesis with flexion contracture  skin: Warm and dry.  No rashes noted.  Data Reviewed:   CBC: Recent Labs  Lab 05/25/23 1856 05/26/23 0555 05/27/23 0529  WBC 11.9* 10.1 9.8  HGB 13.8 13.8 14.4  HCT 44.7 43.4 46.0  MCV 90.7 90.2 91.3  PLT 262 271 292    Basic Metabolic Panel: Recent Labs  Lab 05/25/23 1856 05/26/23 0555 05/27/23 0529  NA 137 141 137  K 3.2* 3.8 3.5  CL 106 110 104  CO2 23 25 26   GLUCOSE 104* 85 78  BUN 24* 22 25*  CREATININE 0.91 0.74 0.88  CALCIUM  8.1* 8.8* 8.6*  MG  --   --  2.2    Liver Function Tests: Recent Labs  Lab 05/25/23 1856  AST 20  ALT 14  ALKPHOS 92  BILITOT 0.7  PROT 6.3*  ALBUMIN 3.1*     Radiology Studies: DG Chest Port 1 View Result Date: 05/25/2023 CLINICAL DATA:  Weakness EXAM: PORTABLE CHEST 1 VIEW COMPARISON:  10/23/2022 FINDINGS: 2 frontal views of the chest demonstrate an unremarkable cardiac silhouette. No airspace disease, effusion, or pneumothorax. No acute bony abnormalities. Ventriculostomy catheter tubing overlies the right chest. IMPRESSION: 1. No acute intrathoracic process. Electronically Signed   By: Ozell Daring M.D.   On: 05/25/2023 20:32      LOS: 1 day   Vernal Alstrom, MD [facility] Available via Epic secure chat 7am-7pm After these hours, please refer to coverage provider listed on [email or web address] 05/27/2023, 11:34 AM

## 2023-05-28 DIAGNOSIS — L03314 Cellulitis of groin: Secondary | ICD-10-CM | POA: Diagnosis not present

## 2023-05-28 MED ORDER — HYDROCODONE-ACETAMINOPHEN 5-325 MG PO TABS
1.0000 | ORAL_TABLET | Freq: Four times a day (QID) | ORAL | Status: DC | PRN
  Administered 2023-05-28 – 2023-06-04 (×18): 1 via ORAL
  Filled 2023-05-28 (×19): qty 1

## 2023-05-28 MED ORDER — ACETAMINOPHEN 325 MG PO TABS
325.0000 mg | ORAL_TABLET | Freq: Four times a day (QID) | ORAL | Status: DC | PRN
  Administered 2023-05-28 – 2023-06-08 (×10): 325 mg via ORAL
  Filled 2023-05-28 (×11): qty 1

## 2023-05-28 NOTE — Plan of Care (Signed)
 ?  Problem: Clinical Measurements: ?Goal: Will remain free from infection ?Outcome: Progressing ?  ?

## 2023-05-28 NOTE — Progress Notes (Addendum)
 PROGRESS NOTE    Joel Solis  FMW:[id number] DOB: 18-Jun-1945 DOA: 05/25/2023 [facility]: [facility]    Brief Narrative:   Joel Solis is a 78 y.o. male with medical history significant for hemorrhagic stroke with residual left hemiparesis (nonambulatory, uses power wheelchair at baseline), PAD, HTN, HLD, s/p VP shunt  presented to the ED for evaluation of generalized weakness, pain all over, skin infection.  Patient currently lives alone at home and has a wheelchair for ambulation but has been getting weaker even to get in his wheelchair now.  In the ED patient had multiple areas of excoriation especially around the perineal area and legs and complained of shooting pain down the legs.  In the ED vitals were stable.  Lab was notable for potassium low at 3.2.  COVID influenza and RSV was negative.  Chest x-ray was negative for infiltrate.  Patient was given IV Ancef  and potassium and was admitted to hospital with working diagnosis of cellulitis involving left groin and left foot.    Assessment and Plan:  Cellulitis of left groin and left foot: WBC mildly elevated on presentation which has normalized at this time.  Afebrile.  Continue IV Ancef .  Urinalysis was negative.  Will continue to monitor closely.  Complains of left lower extremity pain.  Would be transition to oral antibiotic on discharge.   Failure to thrive/ Progressive weakness. Poor functional status at baseline with worsening over the last few days.  Lives alone and has a wheelchair at home which he has not been able to use.   Physical therapy has recommended skilled nursing facility placement at this time.  Left-sided  heel pain headache.  Will need to offload pressure from the heels.  Will add Norco.  Continue Tylenol .   Hypokalemia: Improved after replacement.  Latest potassium of 3.5   History of hemorrhagic stroke with residual left hemiparesis Nonambulatory - uses power wheelchair at  baseline: Continue aspirin  and statin.  PT recommended skilled nursing facility placement.   Hypertension: Continue lisinopril , Lopressor , amlodipine .  Blood pressure seems to be stable.   Hyperlipidemia: Continue atorvastatin .    DVT prophylaxis: enoxaparin  (LOVENOX ) injection 40 mg Start: 05/26/23 1000   Code Status:     Code Status: Full Code  Disposition: Skilled nursing facility as per PT recommendation.  Medically stable for disposition.  Status is: .Inpatient.  The patient is inpatient because: Cellulitis, IV antibiotic, need for rehabilitation.   Family Communication:  Spoke with the patient's daughter on the phone and updated her about the clinical condition of the patient.  Patient's daughter lives out of the state and wishes a skilled nursing facility for the patient..  Consultants:  None  Procedures:  None  Antimicrobials:  Ancef  IV  Anti-infectives (From admission, onward)    Start     Dose/Rate Route Frequency Ordered Stop   05/26/23 0000  ceFAZolin  (ANCEF ) IVPB 1 g/50 mL premix        1 g 100 mL/hr over 30 Minutes Intravenous Every 8 hours 05/25/23 2320 06/01/23 2359   05/25/23 2145  ceFAZolin  (ANCEF ) IVPB 1 g/50 mL premix  Status:  Discontinued        1 g 100 mL/hr over 30 Minutes Intravenous  Once 05/25/23 2143 05/25/23 2320      Subjective: Today, patient was seen and examined at bedside.  Complains of headache, heel pain.  Denies any shortness of breath, chest pain, dizziness, lightheadedness.  Objective: Vitals:   05/27/23 0927 05/27/23 1419 05/27/23 2026  05/28/23 0540  BP: 135/74 101/63 123/64 130/76  Pulse: 61 (!) 58 65 (!) 56  Resp: 18 18 17 18   Temp: (!) 97.5 F (36.4 C) 97.6 F (36.4 C) 97.7 F (36.5 C) 98.2 F (36.8 C)  TempSrc: Oral Oral Oral Oral  SpO2: 99% 93% 94% 93%  Weight:      Height:        Intake/Output Summary (Last 24 hours) at 05/28/2023 1158 Last data filed at 05/28/2023 1000 Gross per 24 hour  Intake 1167.88 ml   Output 1450 ml  Net -282.12 ml   Filed Weights   05/25/23 1717 05/25/23 1735  Weight: 68.1 kg 68 kg    Physical Examination: Body mass index is 22.15 kg/m.   General:  Average built, not in obvious distress,appears chronically ill and deconditioned,   HENT:   No scleral pallor or icterus noted. Oral mucosa is moist.  Chest:   Diminished breath sounds bilaterally. No crackles or wheezes.  CVS: S1 &S2 heard. No murmur.  Regular rate and rhythm. Abdomen: Soft, nontender, nondistended.  Bowel sounds are heard.   Extremities: Left leg with edema erythema and tenderness on palpation with multiple areas of skin breakdown scabs,  left hemiparesis with contracture. Psych: Alert, awake and oriented, normal mood CNS:  No cranial nerve deficits.  Left hemiparesis with flexion contracture  skin: Warm and dry.  Superficial skin ulceration and redness on the left side.  Data Reviewed:   CBC: Recent Labs  Lab 05/25/23 1856 05/26/23 0555 05/27/23 0529  WBC 11.9* 10.1 9.8  HGB 13.8 13.8 14.4  HCT 44.7 43.4 46.0  MCV 90.7 90.2 91.3  PLT 262 271 292    Basic Metabolic Panel: Recent Labs  Lab 05/25/23 1856 05/26/23 0555 05/27/23 0529  NA 137 141 137  K 3.2* 3.8 3.5  CL 106 110 104  CO2 23 25 26   GLUCOSE 104* 85 78  BUN 24* 22 25*  CREATININE 0.91 0.74 0.88  CALCIUM  8.1* 8.8* 8.6*  MG  --   --  2.2    Liver Function Tests: Recent Labs  Lab 05/25/23 1856  AST 20  ALT 14  ALKPHOS 92  BILITOT 0.7  PROT 6.3*  ALBUMIN 3.1*     Radiology Studies: No results found.     LOS: 2 days   Vernal Alstrom, MD [facility] Available via Epic secure chat 7am-7pm After these hours, please refer to coverage provider listed on [email or web address] 05/28/2023, 11:58 AM

## 2023-05-29 DIAGNOSIS — L03314 Cellulitis of groin: Secondary | ICD-10-CM | POA: Diagnosis not present

## 2023-05-29 DIAGNOSIS — R627 Adult failure to thrive: Secondary | ICD-10-CM

## 2023-05-29 NOTE — Plan of Care (Signed)

## 2023-05-29 NOTE — Progress Notes (Signed)
 Progress Note    Joel Solis   FMW:[id number]  DOB: Jan 31, 1946  DOA: 05/25/2023     3 PCP: [facility]  Initial CC: cellulitis, weakness  Hospital Course: Joel Solis is a 78 y.o. male with medical history significant for hemorrhagic stroke with residual left hemiparesis (nonambulatory, uses power wheelchair at baseline), PAD, HTN, HLD, s/p VP shunt  presented to the ED for evaluation of generalized weakness, pain all over, skin infection.  Patient currently lives alone at home and has a wheelchair for ambulation but has been getting weaker even to get in his wheelchair now.  In the ED patient had multiple areas of excoriation especially around the perineal area and legs and complained of shooting pain down the legs.  In the ED vitals were stable.  Lab was notable for potassium low at 3.2.  COVID influenza and RSV was negative.  Chest x-ray was negative for infiltrate.  Patient was given IV Ancef  and potassium and was admitted to hospital with working diagnosis of cellulitis involving left groin and left foot.  Assessment and Plan:  Cellulitis of left groin and left foot: WBC mildly elevated on presentation which has normalized at this time..  Continue IV Ancef .  Urinalysis was negative.  Will continue to monitor closely.  Is afebrile.   Failure to thrive: Progressive weakness. Poor functional status at baseline with worsening.  Lives alone and has a wheelchair.   - awaiting SNF placement    Hypokalemia: Improved after replacement.   History of hemorrhagic stroke with residual left hemiparesis Nonambulatory - uses power wheelchair at baseline: Continue aspirin  and statin.    Hypertension: Continue lisinopril , Lopressor , amlodipine .   Hyperlipidemia: Continue atorvastatin .  Interval History:  No events overnight.  Resting comfortably in bed.  About to eat breakfast.  Old records reviewed in assessment of this patient  Antimicrobials: Ancef  1/2  >> current   DVT prophylaxis:  enoxaparin  (LOVENOX ) injection 40 mg Start: 05/26/23 1000   Code Status:   Code Status: Full Code  Mobility Assessment (Last 72 Hours)     Mobility Assessment     Row Name 05/28/23 2024 05/28/23 0740 05/27/23 1954 05/27/23 1330 05/27/23 1202   Does patient have an order for bedrest or is patient medically unstable No - Continue assessment Yes- Bedfast (Level 1) - Complete Yes- Bedfast (Level 1) - Complete -- --   What is the highest level of mobility based on the progressive mobility assessment? Level 1 (Bedfast) - Unable to balance while sitting on edge of bed Level 1 (Bedfast) - Unable to balance while sitting on edge of bed Level 2 (Chairfast) - Balance while sitting on edge of bed and cannot stand Level 2 (Chairfast) - Balance while sitting on edge of bed and cannot stand Level 2 (Chairfast) - Balance while sitting on edge of bed and cannot stand   Is the above level different from baseline mobility prior to current illness? No - Consider discontinuing PT/OT Yes - Recommend PT order Yes - Recommend PT order -- --    Row Name 05/27/23 0815 05/26/23 2100 05/26/23 1600       Does patient have an order for bedrest or is patient medically unstable Yes- Bedfast (Level 1) - Complete Yes- Bedfast (Level 1) - Complete Yes- Bedfast (Level 1) - Complete              Barriers to discharge: none Disposition Plan:  SNF HH orders placed:  Status is: Inpt  Objective: Blood  pressure 137/69, pulse 70, temperature 98.4 F (36.9 C), temperature source Oral, resp. rate 20, height 5' 9 (1.753 m), weight 68 kg, SpO2 92%.  Examination:  Physical Exam Constitutional:      General: He is not in acute distress.    Appearance: Normal appearance.  HENT:     Head: Normocephalic and atraumatic.     Mouth/Throat:     Mouth: Mucous membranes are moist.  Eyes:     Extraocular Movements: Extraocular movements intact.  Cardiovascular:     Rate and Rhythm: Normal rate  and regular rhythm.  Pulmonary:     Effort: Pulmonary effort is normal. No respiratory distress.     Breath sounds: Normal breath sounds. No wheezing.  Abdominal:     General: Bowel sounds are normal. There is no distension.     Palpations: Abdomen is soft.     Tenderness: There is no abdominal tenderness.  Musculoskeletal:        General: Normal range of motion.     Cervical back: Normal range of motion and neck supple.  Skin:    General: Skin is warm and dry.  Neurological:     General: No focal deficit present.     Mental Status: He is alert.  Psychiatric:        Mood and Affect: Mood normal.        Behavior: Behavior normal.      Consultants:    Procedures:    Data Reviewed: No results found for this or any previous visit (from the past 24 hours).  I have reviewed pertinent nursing notes, vitals, labs, and images as necessary. I have ordered labwork to follow up on as indicated.  I have reviewed the last notes from staff over past 24 hours. I have discussed patient's care plan and test results with nursing staff, CM/SW, and other staff as appropriate.    LOS: 3 days   Alm Apo, MD [facility] 05/29/2023, 10:23 AM

## 2023-05-29 NOTE — Plan of Care (Signed)
   Problem: Education: Goal: Knowledge of General Education information will improve Description Including pain rating scale, medication(s)/side effects and non-pharmacologic comfort measures Outcome: Progressing   Problem: Health Behavior/Discharge Planning: Goal: Ability to manage health-related needs will improve Outcome: Progressing

## 2023-05-30 DIAGNOSIS — L03314 Cellulitis of groin: Secondary | ICD-10-CM | POA: Diagnosis not present

## 2023-05-30 MED ORDER — TIZANIDINE HCL 4 MG PO TABS
2.0000 mg | ORAL_TABLET | Freq: Once | ORAL | Status: AC
  Administered 2023-05-30: 2 mg via ORAL
  Filled 2023-05-30: qty 1

## 2023-05-30 NOTE — Progress Notes (Signed)
 Physical Therapy Treatment Patient Details Name: Joel Solis MRN: [id number] DOB: [birth year] Today's Date: 05/30/2023   History of Present Illness 78 y.o. male with presented to the ED for evaluation of generalized weakness, pain all over, skin infection, admitted with cellulitis. PMH: hemorrhagic stroke with residual left hemiparesis (nonambulatory, uses power wheelchair at baseline), PAD, HTN, HLD, s/p VP shunt who    PT Comments  General Comments: AxO x 3 pleasant and willing.  Fearful of falling. Pt lives home alone with a cat and a personal caregiver a few days a week. Assisted OOB to recliner required much encouragement.  General bed mobility comments: VERY difficult due to L hemiparesis with UE and LE flexed/contracted.  Pt using bed rail with R UE and momentum to sit EOB.  Required several tries with posterior and RIGHT lean.  Great difficulty scooting and fearful of face planting forward. General transfer comment: assisted from bed to drop arm recliner lateral scoot + 2 asisst (anterior/posterior) using bed pad to complete.  Rec Maxi Lift back to bed.  Pad in place in recliner.  Positioned to comfort.  Pt unable to tolerate B LE elevated (L LE knee flex).   Pt will need ST Rehab at SNF to address mobility and functional decline prior to safely returning home.    If plan is discharge home, recommend the following: Two people to help with walking and/or transfers;Two people to help with bathing/dressing/bathroom;Help with stairs or ramp for entrance;Assist for transportation;Assistance with cooking/housework   Can travel by private vehicle     No  Equipment Recommendations  None recommended by PT    Recommendations for Other Services       Precautions / Restrictions Precautions Precautions: Fall Precaution Comments: Hx CVA L side weakness/contractures Restrictions Weight Bearing Restrictions Per Provider Order: No     Mobility  Bed Mobility Overal bed mobility: Needs  Assistance Bed Mobility: Supine to Sit     Supine to sit: Total assist, Max assist     General bed mobility comments: VERY difficult due to L hemiparesis with UE and LE flexed/contracted.  Pt using bed rail with R UE and momentum to sit EOB.  Required several tries with posterior and RIGHT lean.  Great difficulty scooting and fearful of face planting forward.    Transfers Overall transfer level: Needs assistance Equipment used: None Transfers: Bed to chair/wheelchair/BSC            Lateral/Scoot Transfers: +2 physical assistance, +2 safety/equipment, Max assist, Total assist General transfer comment: assisted from bed to drop arm recliner lateral scoot + 2 asisst (anterior/posterior? using bed pad to complete.  Rec Maxi Lift back to bed.  Pad in place in recliner.    Ambulation/Gait               General Gait Details: non amb   Stairs             Wheelchair Mobility     Tilt Bed    Modified Rankin (Stroke Patients Only)       Balance                                            Cognition Arousal: Alert Behavior During Therapy: WFL for tasks assessed/performed Overall Cognitive Status: Within Functional Limits for tasks assessed  General Comments: AxO x 3 pleasant and willing.  Fearful of falling. Pt lives home alone with a cat and a personal caregiver a few days a week.        Exercises      General Comments        Pertinent Vitals/Pain Pain Assessment Pain Assessment: Faces Faces Pain Scale: Hurts a little bit Pain Location: all over Pain Descriptors / Indicators: Constant Pain Intervention(s): Monitored during session, Repositioned    Home Living                          Prior Function            PT Goals (current goals can now be found in the care plan section) Progress towards PT goals: Progressing toward goals    Frequency    Min  1X/week      PT Plan      Co-evaluation              AM-PAC PT 6 Clicks Mobility   Outcome Measure  Help needed turning from your back to your side while in a flat bed without using bedrails?: Total Help needed moving from lying on your back to sitting on the side of a flat bed without using bedrails?: Total Help needed moving to and from a bed to a chair (including a wheelchair)?: Total Help needed standing up from a chair using your arms (e.g., wheelchair or bedside chair)?: Total Help needed to walk in hospital room?: Total Help needed climbing 3-5 steps with a railing? : Total 6 Click Score: 6    End of Session Equipment Utilized During Treatment: Gait belt Activity Tolerance: Patient tolerated treatment well Patient left: in chair;with call bell/phone within reach;with nursing/sitter in room Nurse Communication: Mobility status PT Visit Diagnosis: Hemiplegia and hemiparesis;Other abnormalities of gait and mobility (R26.89) Hemiplegia - Right/Left: Left Hemiplegia - caused by: Cerebral infarction     Time: 8587-8561 PT Time Calculation (min) (ACUTE ONLY): 26 min  Charges:    $Therapeutic Activity: 23-37 mins PT General Charges $$ ACUTE PT VISIT: 1 Visit                     Katheryn Leap  PTA Acute  Rehabilitation Services Office M-F          [phone]

## 2023-05-30 NOTE — Progress Notes (Signed)
 Progress Note    Joel Solis   FMW:[id number]  DOB: [birth year]  DOA: 05/25/2023     4 PCP: [facility]  Initial CC: cellulitis, weakness  Hospital Course: Joel Solis is a 79 y.o. male with medical history significant for hemorrhagic stroke with residual left hemiparesis (nonambulatory, uses power wheelchair at baseline), PAD, HTN, HLD, s/p VP shunt  presented to the ED for evaluation of generalized weakness, pain all over, skin infection.  Patient currently lives alone at home and has a wheelchair for ambulation but has been getting weaker even to get in his wheelchair now.  In the ED patient had multiple areas of excoriation especially around the perineal area and legs and complained of shooting pain down the legs.  In the ED vitals were stable.  Lab was notable for potassium low at 3.2.  COVID influenza and RSV was negative.  Chest x-ray was negative for infiltrate.  Patient was given IV Ancef  and potassium and was admitted to hospital with working diagnosis of cellulitis involving left groin and left foot.  Assessment and Plan:  Cellulitis of left groin and left foot: WBC mildly elevated on presentation which has normalized at this time..  Continue IV Ancef .  Urinalysis was negative.  Will continue to monitor closely.  Is afebrile.   Failure to thrive: Progressive weakness. Poor functional status at baseline with worsening.  Lives alone and has a wheelchair.   - awaiting SNF placement    Hypokalemia: Improved after replacement.   History of hemorrhagic stroke with residual left hemiparesis Nonambulatory - uses power wheelchair at baseline: Continue aspirin  and statin.    Hypertension: Continue lisinopril , Lopressor , amlodipine .   Hyperlipidemia: Continue atorvastatin .  Interval History:  No events overnight.  Resting comfortably in bed.  About to eat breakfast.  Old records reviewed in assessment of this patient  Antimicrobials: Ancef  1/2  >> current   DVT prophylaxis:  enoxaparin  (LOVENOX ) injection 40 mg Start: 05/26/23 1000   Code Status:   Code Status: Full Code  Mobility Assessment (Last 72 Hours)     Mobility Assessment     Row Name 05/30/23 0725 05/29/23 2141 05/29/23 0942 05/28/23 2024 05/28/23 0740   Does patient have an order for bedrest or is patient medically unstable No - Continue assessment No - Continue assessment No - Continue assessment No - Continue assessment Yes- Bedfast (Level 1) - Complete   What is the highest level of mobility based on the progressive mobility assessment? Level 2 (Chairfast) - Balance while sitting on edge of bed and cannot stand Level 2 (Chairfast) - Balance while sitting on edge of bed and cannot stand Level 2 (Chairfast) - Balance while sitting on edge of bed and cannot stand Level 1 (Bedfast) - Unable to balance while sitting on edge of bed Level 1 (Bedfast) - Unable to balance while sitting on edge of bed   Is the above level different from baseline mobility prior to current illness? No - Consider discontinuing PT/OT No - Consider discontinuing PT/OT No - Consider discontinuing PT/OT No - Consider discontinuing PT/OT Yes - Recommend PT order    Row Name 05/27/23 1954 05/27/23 1330 05/27/23 1202       Does patient have an order for bedrest or is patient medically unstable Yes- Bedfast (Level 1) - Complete -- --     What is the highest level of mobility based on the progressive mobility assessment? Level 2 (Chairfast) - Balance while sitting on edge of bed and  cannot stand Level 2 (Chairfast) - Balance while sitting on edge of bed and cannot stand Level 2 (Chairfast) - Balance while sitting on edge of bed and cannot stand     Is the above level different from baseline mobility prior to current illness? Yes - Recommend PT order -- --              Barriers to discharge: none Disposition Plan:  SNF HH orders placed:  Status is: Inpt  Objective: Blood pressure 118/64, pulse  62, temperature 97.8 F (36.6 C), resp. rate 18, height 5' 9 (1.753 m), weight 68 kg, SpO2 96%.  Examination:  Physical Exam Constitutional:      General: He is not in acute distress.    Appearance: Normal appearance.  HENT:     Head: Normocephalic and atraumatic.     Mouth/Throat:     Mouth: Mucous membranes are moist.  Eyes:     Extraocular Movements: Extraocular movements intact.  Cardiovascular:     Rate and Rhythm: Normal rate and regular rhythm.  Pulmonary:     Effort: Pulmonary effort is normal. No respiratory distress.     Breath sounds: Normal breath sounds. No wheezing.  Abdominal:     General: Bowel sounds are normal. There is no distension.     Palpations: Abdomen is soft.     Tenderness: There is no abdominal tenderness.  Musculoskeletal:        General: Normal range of motion.     Cervical back: Normal range of motion and neck supple.  Skin:    General: Skin is warm and dry.  Neurological:     General: No focal deficit present.     Mental Status: He is alert.  Psychiatric:        Mood and Affect: Mood normal.        Behavior: Behavior normal.      Consultants:    Procedures:    Data Reviewed: No results found for this or any previous visit (from the past 24 hours).  I have reviewed pertinent nursing notes, vitals, labs, and images as necessary. I have ordered labwork to follow up on as indicated.  I have reviewed the last notes from staff over past 24 hours. I have discussed patient's care plan and test results with nursing staff, CM/SW, and other staff as appropriate.    LOS: 4 days   Alm Apo, MD [facility] 05/30/2023, 9:52 AM

## 2023-05-30 NOTE — TOC Initial Note (Signed)
 Transition of Care [facility]) - Initial/Assessment Note    Patient Details  Name: Joel Solis MRN: [id number] Date of Birth: 04-16-1946  [facility] CM/SW Contact:    NORMAN ASPEN, LCSW Phone Number: 05/30/2023, 2:56 PM  Clinical Narrative:                 Met with pt to review dc recommendations from therapy for SNF rehab.  He is very agreeable with this plan as he notes he lives alone and mobility at wheelchair level. He requests not to return to facility he went to ~ 6months ago ([facility] in [place]).  He is agreeable for CSW to contact his daughter to update her as well.  Will begin SNF bed search process.  Expected Discharge Plan: Skilled Nursing Facility Barriers to Discharge: SNF Pending bed offer, Insurance Authorization   Patient Goals and CMS Choice Patient states their goals for this hospitalization and ongoing recovery are:: to dc to SNF rehab          Expected Discharge Plan and Services In-house Referral: Clinical Social Work   Post Acute Care Choice: Skilled Nursing Facility Living arrangements for the past 2 months: Single Family Home                                      Prior Living Arrangements/Services Living arrangements for the past 2 months: Single Family Home Lives with:: Self Patient language and need for interpreter reviewed:: Yes Do you feel safe going back to the place where you live?: Yes      Need for Family Participation in Patient Care: Yes (Comment) Care giver support system in place?: No (comment)   Criminal Activity/Legal Involvement Pertinent to Current Situation/Hospitalization: No - Comment as needed  Activities of Daily Living   ADL Screening (condition at time of admission) Independently performs ADLs?: No Does the patient have a NEW difficulty with bathing/dressing/toileting/self-feeding that is expected to last >3 days?: No Does the patient have a NEW difficulty with getting in/out of bed, walking, or climbing  stairs that is expected to last >3 days?: No Does the patient have a NEW difficulty with communication that is expected to last >3 days?: No Is the patient deaf or have difficulty hearing?: No Does the patient have difficulty seeing, even when wearing glasses/contacts?: No Does the patient have difficulty concentrating, remembering, or making decisions?: No  Permission Sought/Granted Permission sought to share information with : Family Supports Permission granted to share information with : Yes, Verbal Permission Granted  Share Information with NAME: daughter, Joel Solis [phone]           Emotional Assessment Appearance:: Appears stated age Attitude/Demeanor/Rapport: Engaged Affect (typically observed): Accepting Orientation: : Oriented to Self, Oriented to Place, Oriented to  Time, Oriented to Situation Alcohol / Substance Use: Not Applicable Psych Involvement: No (comment)  Admission diagnosis:  Hypokalemia [E87.6] Cellulitis [L03.90] Hypoalbuminemia [E88.09] Failure to thrive in adult [R62.7] Generalized weakness [R53.1] Cellulitis of groin, left [L03.314] Excoriation of multiple sites [T07.XXXA] Cellulitis, unspecified cellulitis site [L03.90] Patient Active Problem List   Diagnosis Date Noted   Cellulitis of groin, left 05/25/2023   History of hemorrhagic cerebrovascular accident (CVA) with residual deficit 05/25/2023   Failure to thrive in adult 05/25/2023   Fall 10/23/2022   CVA, old, hemiparesis (HCC) 10/23/2022   Urinary retention 10/23/2022   PAD (peripheral artery disease) (HCC) 05/08/2018   Cellulitis  of left leg 04/13/2018   Lower extremity cellulitis 02/02/2018   Infected superficial injury of toe of right foot 02/02/2018   Cellulitis 02/02/2018   UTI (urinary tract infection) 11/22/2016   AKI (acute kidney injury) (HCC) 11/22/2016   Inneffective medication administration routine at home 11/22/2016   Dirty living conditions 11/22/2016   Elevated  troponin 11/16/2016   Acute metabolic encephalopathy 11/15/2016   Hypokalemia 11/15/2016   Essential hypertension    HLD (hyperlipidemia)    S/P VP shunt    Tobacco abuse    PCP:  [facility] Pharmacy:   [id number] [place], [place] - [address] [place] [address] Phone: [phone] Fax: [phone]  [facility] [address] [place] [address] Phone: [phone] Fax: [phone]     Social Drivers of Health (SDOH) Social History: SDOH Screenings   Food Insecurity: No Food Insecurity (05/26/2023)  Housing: Low Risk  (05/26/2023)  Transportation Needs: Unmet Transportation Needs (05/26/2023)  Utilities: At Risk (05/26/2023)  Social Connections: Socially Isolated (05/26/2023)  Tobacco Use: High Risk (05/25/2023)   SDOH Interventions:     Readmission Risk Interventions    05/30/2023    2:53 PM  Readmission Risk Prevention Plan  Post Dischage Appt Complete  Medication Screening Complete  Transportation Screening Complete

## 2023-05-31 DIAGNOSIS — L03314 Cellulitis of groin: Secondary | ICD-10-CM | POA: Diagnosis not present

## 2023-05-31 NOTE — Plan of Care (Signed)
   Problem: Education: Goal: Knowledge of General Education information will improve Description Including pain rating scale, medication(s)/side effects and non-pharmacologic comfort measures Outcome: Progressing

## 2023-05-31 NOTE — Progress Notes (Signed)
 PROGRESS NOTE  Joel Solis FMW:[id number] DOB: 12-Aug-1945 DOA: 05/25/2023 PCP: [facility]   LOS: 5 days   Brief narrative:  Joel Solis is a 78 y.o. male with medical history significant for hemorrhagic stroke with residual left hemiparesis, nonambulatory uses  power wheelchair at baseline), PAD, HTN, HLD, s/p VP shunt  presented to the ED for evaluation of generalized weakness and pain with infection in the skin.  Patient currently lives alone at home and has a wheelchair for ambulation but has been getting weaker even to get in his wheelchair now.  In the ED, patient had multiple areas of excoriation especially around the perineal area and legs and complained of shooting pain down the legs.  In the ED, vitals were stable.  Lab was notable for potassium low at 3.2.  COVID influenza and RSV was negative.  Chest x-ray was negative for infiltrate.  Patient was given IV Ancef  and potassium and was admitted to hospital with working diagnosis of cellulitis involving left groin and left foot.  Assessment/Plan:  Cellulitis of left groin and left foot: No recent labs.  Continue IV Ancef .  Urinalysis was negative.  Afebrile.  Could be changed to oral Keflex  on discharge.   Failure to thrive/Progressive weakness. Poor functional status at baseline with worsening.  Lives alone and has a wheelchair. waiting for skilled nursing facility   Hypokalemia: Improved after replacement.  Check BMP in AM.   History of hemorrhagic stroke with residual left hemiparesis Nonambulatory - uses power wheelchair at baseline: Continue aspirin  and statin.    Hypertension: Continue lisinopril , Lopressor , amlodipine .   Hyperlipidemia: Continue atorvastatin .   DVT prophylaxis: enoxaparin  (LOVENOX ) injection 40 mg Start: 05/26/23 1000   Disposition: Awaiting for skilled nursing facility placement.  Medically stable for disposition.  Status is: Inpatient Remains inpatient appropriate  because: Need for skilled nursing facility     Code Status:     Code Status: Full Code  Family Communication: None at bedside  Consultants: None  Procedures: None  Anti-infectives:  Cefazolin  IV  Anti-infectives (From admission, onward)    Start     Dose/Rate Route Frequency Ordered Stop   05/26/23 0000  ceFAZolin  (ANCEF ) IVPB 1 g/50 mL premix        1 g 100 mL/hr over 30 Minutes Intravenous Every 8 hours 05/25/23 2320 06/01/23 2359   05/25/23 2145  ceFAZolin  (ANCEF ) IVPB 1 g/50 mL premix  Status:  Discontinued        1 g 100 mL/hr over 30 Minutes Intravenous  Once 05/25/23 2143 05/25/23 2320        Subjective: Today, patient was seen and examined at bedside.  Patient denies interval complaints.  No fever chills or rigor.  Objective: Vitals:   05/30/23 2146 05/31/23 0650  BP: 132/71 (!) 146/76  Pulse: 76 65  Resp: 18 18  Temp: 98.3 F (36.8 C) 97.9 F (36.6 C)  SpO2: 95% 98%    Intake/Output Summary (Last 24 hours) at 05/31/2023 1051 Last data filed at 05/31/2023 1000 Gross per 24 hour  Intake 1370 ml  Output 950 ml  Net 420 ml   Filed Weights   05/25/23 1717 05/25/23 1735  Weight: 68.1 kg 68 kg   Body mass index is 22.15 kg/m.   Physical Exam: GENERAL: Patient is alert awake and oriented. Not in obvious distress. HENT: No scleral pallor or icterus. Pupils equally reactive to light. Oral mucosa is moist NECK: is supple, no gross swelling noted. CHEST: Clear to  auscultation. No crackles or wheezes.   CVS: S1 and S2 heard, no murmur. Regular rate and rhythm.  ABDOMEN: Soft, non-tender, bowel sounds are present. EXTREMITIES: No edema. CNS: Cranial nerves are intact.  Left hemiparesis with contracture. SKIN: warm and dry without rashes.  Left lower extremity with erythema superficial skin erosion and mild cellulitis.  Improved from previous.  Data Review: I have personally reviewed the following laboratory data and studies,  CBC: Recent Labs  Lab  May 28, 2023 1856 05/26/23 0555 05/27/23 0529  WBC 11.9* 10.1 9.8  HGB 13.8 13.8 14.4  HCT 44.7 43.4 46.0  MCV 90.7 90.2 91.3  PLT 262 271 292   Basic Metabolic Panel: Recent Labs  Lab May 28, 2023 1856 05/26/23 0555 05/27/23 0529  NA 137 141 137  K 3.2* 3.8 3.5  CL 106 110 104  CO2 23 25 26   GLUCOSE 104* 85 78  BUN 24* 22 25*  CREATININE 0.91 0.74 0.88  CALCIUM  8.1* 8.8* 8.6*  MG  --   --  2.2   Liver Function Tests: Recent Labs  Lab 2023/05/28 1856  AST 20  ALT 14  ALKPHOS 92  BILITOT 0.7  PROT 6.3*  ALBUMIN 3.1*   No results for input(s): LIPASE, AMYLASE in the last 168 hours. No results for input(s): AMMONIA in the last 168 hours. Cardiac Enzymes: No results for input(s): CKTOTAL, CKMB, CKMBINDEX, TROPONINI in the last 168 hours. BNP (last 3 results) Recent Labs    10/24/22 0542 10/25/22 0439  BNP 448.0* 419.6*    ProBNP (last 3 results) No results for input(s): PROBNP in the last 8760 hours.  CBG: No results for input(s): GLUCAP in the last 168 hours. Recent Results (from the past 240 hours)  Resp panel by RT-PCR (RSV, Flu A&B, Covid) Anterior Nasal Swab     Status: None   Collection Time: 05/28/23  7:07 PM   Specimen: Anterior Nasal Swab  Result Value Ref Range Status   SARS Coronavirus 2 by RT PCR NEGATIVE NEGATIVE Final    Comment: (NOTE) SARS-CoV-2 target nucleic acids are NOT DETECTED.  The SARS-CoV-2 RNA is generally detectable in upper respiratory specimens during the acute phase of infection. The lowest concentration of SARS-CoV-2 viral copies this assay can detect is 138 copies/mL. A negative result does not preclude SARS-Cov-2 infection and should not be used as the sole basis for treatment or other patient management decisions. A negative result may occur with  improper specimen collection/handling, submission of specimen other than nasopharyngeal swab, presence of viral mutation(s) within the areas targeted by this assay,  and inadequate number of viral copies(<138 copies/mL). A negative result must be combined with clinical observations, patient history, and epidemiological information. The expected result is Negative.  Fact Sheet for Patients:  bloggercourse.com  Fact Sheet for Healthcare Providers:  seriousbroker.it  This test is no t yet approved or cleared by the [place]  FDA and  has been authorized for detection and/or diagnosis of SARS-CoV-2 by FDA under an Emergency Use Authorization (EUA). This EUA will remain  in effect (meaning this test can be used) for the duration of the COVID-19 declaration under Section 564(b)(1) of the Act, 21 U.S.C.section 360bbb-3(b)(1), unless the authorization is terminated  or revoked sooner.       Influenza A by PCR NEGATIVE NEGATIVE Final   Influenza B by PCR NEGATIVE NEGATIVE Final    Comment: (NOTE) The Xpert Xpress SARS-CoV-2/FLU/RSV plus assay is intended as an aid in the diagnosis of influenza from Nasopharyngeal swab  specimens and should not be used as a sole basis for treatment. Nasal washings and aspirates are unacceptable for Xpert Xpress SARS-CoV-2/FLU/RSV testing.  Fact Sheet for Patients: bloggercourse.com  Fact Sheet for Healthcare Providers: seriousbroker.it  This test is not yet approved or cleared by the [place]  FDA and has been authorized for detection and/or diagnosis of SARS-CoV-2 by FDA under an Emergency Use Authorization (EUA). This EUA will remain in effect (meaning this test can be used) for the duration of the COVID-19 declaration under Section 564(b)(1) of the Act, 21 U.S.C. section 360bbb-3(b)(1), unless the authorization is terminated or revoked.     Resp Syncytial Virus by PCR NEGATIVE NEGATIVE Final    Comment: (NOTE) Fact Sheet for Patients: bloggercourse.com  Fact Sheet for  Healthcare Providers: seriousbroker.it  This test is not yet approved or cleared by the [place]  FDA and has been authorized for detection and/or diagnosis of SARS-CoV-2 by FDA under an Emergency Use Authorization (EUA). This EUA will remain in effect (meaning this test can be used) for the duration of the COVID-19 declaration under Section 564(b)(1) of the Act, 21 U.S.C. section 360bbb-3(b)(1), unless the authorization is terminated or revoked.  Performed at [facility], [address], [place], [place] [address]      Studies: No results found.    Evia Goldsmith, MD  [facility] 05/31/2023  If 7PM-7AM, please contact night-coverage

## 2023-05-31 NOTE — Progress Notes (Signed)
 Occupational Therapy Treatment Patient Details Name: Joel Solis MRN: [id number] DOB: 06-06-1945 Today's Date: 05/31/2023   History of present illness Patient is a 78 y.o. male with presented to the ED for evaluation of generalized weakness, pain all over, skin infection, admitted with cellulitis. 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 L hand noted to have limited visibility with increased pain and contractures in LUE secondary to h/o CVA. Patient noted to have L index finger nail cutting into palmar surface. Patient's nurse called into room to be able to visualize area as well. Still unable to visualize pinky finger but is in same position as index digit. Nurse reported she would speak with MD. Patient was max A for supine to sit on EOB with increased time to engage in grooming tasks at EOB. Patient will benefit from continued inpatient follow up therapy, <3 hours/day. Patient's discharge plan remains appropriate at this time. OT will continue to follow acutely.        If plan is discharge home, recommend the following:  Two people to help with walking and/or transfers;A lot of help with bathing/dressing/bathroom;Assistance with cooking/housework;Direct supervision/assist for medications management;Direct supervision/assist for financial management;Help with stairs or ramp for entrance;Assist for transportation   Equipment Recommendations  None recommended by OT       Precautions / Restrictions Precautions Precautions: Fall Precaution Comments: Hx CVA L side weakness/contractures Restrictions Weight Bearing Restrictions Per Provider Order: No       Mobility Bed Mobility Overal bed mobility: Needs Assistance Bed Mobility: Supine to Sit, Sit to Supine     Supine to sit: Max  assist Sit to supine: Max assist   General bed mobility comments: with increased time. patient repoting increased pain with touch to LLE and LUE             Balance Overall balance assessment: Needs assistance Sitting-balance support: Single extremity supported, Feet supported Sitting balance-Leahy Scale: Fair           ADL either performed or assessed with clinical judgement   ADL Overall ADL's : Needs assistance/impaired                 Upper Body Dressing : Sitting;Moderate assistance Upper Body Dressing Details (indicate cue type and reason): EOB with increased time.                   General ADL Comments: patient was educated on concerns with L hand positioning and skin integrity. patient reported that he did not want it to get infected. nurse called into room and educated on how to visualize hand and nurse to contact MD. no splinting can be completed with nails growing into skin like current contition.      Cognition Arousal: Alert Behavior During Therapy: WFL for tasks assessed/performed Overall Cognitive Status: Within Functional Limits for tasks assessed     General Comments: patient continues to be fearful of falling, more talkative today                   Pertinent Vitals/ Pain       Pain Assessment Pain Assessment: Faces Faces Pain Scale: Hurts even more Pain Location: L hand with attempts to visualize Pain Descriptors / Indicators: Grimacing, Guarding Pain Intervention(s): Limited activity within patient's tolerance, Monitored during session  Frequency  Min 1X/week        Progress Toward Goals  OT Goals(current goals can now be found in the care plan section)  Progress towards OT goals: Progressing toward goals     Plan         AM-PAC OT 6 Clicks Daily Activity     Outcome Measure   Help from another person eating meals?: A Little Help from another person taking care of personal grooming?: A Little Help  from another person toileting, which includes using toliet, bedpan, or urinal?: Total Help from another person bathing (including washing, rinsing, drying)?: Total Help from another person to put on and taking off regular upper body clothing?: A Lot Help from another person to put on and taking off regular lower body clothing?: Total 6 Click Score: 11    End of Session    OT Visit Diagnosis: Unsteadiness on feet (R26.81);Other abnormalities of gait and mobility (R26.89);Muscle weakness (generalized) (M62.81);Pain   Activity Tolerance Patient limited by pain   Patient Left in bed;with call bell/phone within reach;with bed alarm set   Nurse Communication Mobility status;Other (comment) (concerns over skin integrity of L hand.)        Time: 8667-8597 OT Time Calculation (min): 30 min  Charges: OT General Charges $OT Visit: 1 Visit OT Treatments $Therapeutic Activity: 23-37 mins  Geofm LEYLAND, MS Acute Rehabilitation Department Office# [phone]   Geofm CHRISTELLA Dance 05/31/2023, 3:33 PM

## 2023-05-31 NOTE — NC FL2 (Signed)
   MEDICAID FL2 LEVEL OF CARE FORM     IDENTIFICATION  Patient Name: Joel Solis Birthdate: November 02, 1945 Sex: male Admission Date (Current Location): 05/25/2023  [facility] and [place] Number:  Producer, Television/film/video and Address:  [facility],  [address], [place] [address]      Provider Number: [id number]  Attending Physician Name and Address:  Sonjia Held, MD  Relative Name and Phone Number:  daughter, Najib Colmenares @ [phone]    Current Level of Care: Hospital Recommended Level of Care: Skilled Nursing Facility Prior Approval Number:    Date Approved/Denied:   PASRR Number: [id number]  Discharge Plan: SNF    Current Diagnoses: Patient Active Problem List   Diagnosis Date Noted   Cellulitis of groin, left 05/25/2023   History of hemorrhagic cerebrovascular accident (CVA) with residual deficit 05/25/2023   Failure to thrive in adult 05/25/2023   Fall 10/23/2022   CVA, old, hemiparesis (HCC) 10/23/2022   Urinary retention 10/23/2022   PAD (peripheral artery disease) (HCC) 05/08/2018   Cellulitis of left leg 04/13/2018   Lower extremity cellulitis 02/02/2018   Infected superficial injury of toe of right foot 02/02/2018   Cellulitis 02/02/2018   UTI (urinary tract infection) 11/22/2016   AKI (acute kidney injury) (HCC) 11/22/2016   Inneffective medication administration routine at home 11/22/2016   Dirty living conditions 11/22/2016   Elevated troponin 11/16/2016   Acute metabolic encephalopathy 11/15/2016   Hypokalemia 11/15/2016   Essential hypertension    HLD (hyperlipidemia)    S/P VP shunt    Tobacco abuse     Orientation RESPIRATION BLADDER Height & Weight     Self, Time, Situation, Place  Normal Continent, External catheter Weight: 150 lb (68 kg) Height:  5' 9 (175.3 cm)  BEHAVIORAL SYMPTOMS/MOOD NEUROLOGICAL BOWEL NUTRITION STATUS      Continent Diet  AMBULATORY STATUS COMMUNICATION OF NEEDS Skin   Total Care Verbally  Other (Comment) (cellulitis)                       Personal Care Assistance Level of Assistance  Dressing, Bathing Bathing Assistance: Limited assistance   Dressing Assistance: Limited assistance     Functional Limitations Info  Sight, Hearing, Speech Sight Info: Adequate Hearing Info: Adequate Speech Info: Adequate    SPECIAL CARE FACTORS FREQUENCY  PT (By licensed PT), OT (By licensed OT)     PT Frequency: 5x/wk OT Frequency: 5x/wk            Contractures Contractures Info: Not present    Additional Factors Info  Code Status, Allergies Code Status Info: Full Allergies Info: oyster shell; shellfish allergy           Current Medications (05/31/2023):  This is the current hospital active medication list Current Facility-Administered Medications  Medication Dose Route Frequency Provider Last Rate Last Admin   acetaminophen  (TYLENOL ) tablet 325 mg  325 mg Oral Q6H PRN Pokhrel, Laxman, MD   325 mg at 05/30/23 2254   amLODipine  (NORVASC ) tablet 10 mg  10 mg Oral Daily Patel, Vishal R, MD   10 mg at 05/31/23 0901   aspirin  EC tablet 81 mg  81 mg Oral Daily Patel, Vishal R, MD   81 mg at 05/31/23 0901   atorvastatin  (LIPITOR) tablet 40 mg  40 mg Oral Daily Patel, Vishal R, MD   40 mg at 05/31/23 0901   ceFAZolin  (ANCEF ) IVPB 1 g/50 mL premix  1 g Intravenous Q8H Patel, Vishal  R, MD 100 mL/hr at 05/31/23 0957 1 g at 05/31/23 0957   enoxaparin  (LOVENOX ) injection 40 mg  40 mg Subcutaneous Q24H Patel, Vishal R, MD   40 mg at 05/31/23 0901   HYDROcodone -acetaminophen  (NORCO/VICODIN) 5-325 MG per tablet 1 tablet  1 tablet Oral Q6H PRN Pokhrel, Laxman, MD   1 tablet at 05/31/23 1034   lisinopril  (ZESTRIL ) tablet 20 mg  20 mg Oral Daily Patel, Vishal R, MD   20 mg at 05/31/23 0901   metoprolol  tartrate (LOPRESSOR ) tablet 100 mg  100 mg Oral BID Patel, Vishal R, MD   100 mg at 05/31/23 0901   ondansetron  (ZOFRAN ) tablet 4 mg  4 mg Oral Q6H PRN Patel, Vishal R, MD       Or    ondansetron  (ZOFRAN ) injection 4 mg  4 mg Intravenous Q6H PRN Patel, Vishal R, MD       senna-docusate (Senokot-S) tablet 1 tablet  1 tablet Oral QHS PRN Patel, Vishal R, MD         Discharge Medications: Please see discharge summary for a list of discharge medications.  Relevant Imaging Results:  Relevant Lab Results:   Additional Information ssn:[phone]  Leshaun Biebel, LCSW

## 2023-06-01 DIAGNOSIS — L03314 Cellulitis of groin: Secondary | ICD-10-CM | POA: Diagnosis not present

## 2023-06-01 MED ORDER — GABAPENTIN 100 MG PO CAPS
100.0000 mg | ORAL_CAPSULE | Freq: Every day | ORAL | Status: DC
  Administered 2023-06-01 – 2023-06-07 (×7): 100 mg via ORAL
  Filled 2023-06-01 (×7): qty 1

## 2023-06-01 NOTE — Plan of Care (Signed)
  Problem: Education: Goal: Knowledge of General Education information will improve Description: Including pain rating scale, medication(s)/side effects and non-pharmacologic comfort measures Outcome: Progressing   Problem: Health Behavior/Discharge Planning: Goal: Ability to manage health-related needs will improve Outcome: Progressing   Problem: Clinical Measurements: Goal: Ability to maintain clinical measurements within normal limits will improve Outcome: Progressing Goal: Will remain free from infection Outcome: Progressing Goal: Diagnostic test results will improve Outcome: Progressing Goal: Respiratory complications will improve Outcome: Progressing Goal: Cardiovascular complication will be avoided Outcome: Progressing   Problem: Activity: Goal: Risk for activity intolerance will decrease Outcome: Progressing   Problem: Nutrition: Goal: Adequate nutrition will be maintained Outcome: Progressing   Problem: Coping: Goal: Level of anxiety will decrease Outcome: Progressing   Problem: Elimination: Goal: Will not experience complications related to bowel motility Outcome: Progressing Goal: Will not experience complications related to urinary retention Outcome: Progressing   Problem: Pain Management: Goal: General experience of comfort will improve Outcome: Progressing   Problem: Safety: Goal: Ability to remain free from injury will improve Outcome: Progressing   Problem: Skin Integrity: Goal: Risk for impaired skin integrity will decrease Outcome: Progressing   Problem: Clinical Measurements: Goal: Ability to avoid or minimize complications of infection will improve Outcome: Progressing   Problem: Skin Integrity: Goal: Skin integrity will improve Outcome: Progressing

## 2023-06-01 NOTE — Progress Notes (Addendum)
 PROGRESS NOTE  Joel Solis FMW:[id number] DOB: 23-Nov-1945 DOA: 05/25/2023 PCP: [facility]   LOS: 6 days   Brief narrative:  Joel Solis is a 78 y.o. male with medical history significant for hemorrhagic stroke with residual left hemiparesis, nonambulatory uses  power wheelchair at baseline), PAD, HTN, HLD, s/p VP shunt  presented to the ED for evaluation of generalized weakness and pain with infection in the skin.  Patient currently lives alone at home and has a wheelchair for ambulation but has been getting weaker even to get in his wheelchair now.  In the ED, patient had multiple areas of excoriation especially around the perineal area and legs and complained of shooting pain down the legs.  In the ED, vitals were stable.  Lab was notable for potassium low at 3.2.  COVID influenza and RSV was negative.  Chest x-ray was negative for infiltrate.  Patient was given IV Ancef  and potassium and was admitted to hospital with working diagnosis of cellulitis involving left groin and left foot.  Assessment/Plan:  Cellulitis of left groin and left foot: No recent labs.  Continue IV Ancef .  Urinalysis was negative.  Afebrile.  Plan to change to oral Keflex  on discharge to complete 7 to 10-day course.   Failure to thrive/Progressive weakness. Poor functional status at baseline with worsening.  Lives alone and has a wheelchair.  Currently awaiting  for skilled nursing facility   Hypokalemia: Improved after replacement.  Check BMP in AM.   History of hemorrhagic stroke with residual left hemiparesis Nonambulatory - uses power wheelchair at baseline: Continue aspirin  and statin.    Hypertension: Continue lisinopril , Lopressor , amlodipine .   Hyperlipidemia: Continue atorvastatin .   DVT prophylaxis: enoxaparin  (LOVENOX ) injection 40 mg Start: 05/26/23 1000   Disposition: Awaiting for skilled nursing facility placement.  Medically stable for disposition.  Status  is: Inpatient  Remains inpatient appropriate because: Need for skilled nursing facility     Code Status:     Code Status: Full Code  Family Communication: Spoke with the patient's daughter on the phone and updated her about the clinical condition of the patient.  Consultants: None  Procedures: None  Anti-infectives:  Cefazolin  IV  Anti-infectives (From admission, onward)    Start     Dose/Rate Route Frequency Ordered Stop   05/26/23 0000  ceFAZolin  (ANCEF ) IVPB 1 g/50 mL premix        1 g 100 mL/hr over 30 Minutes Intravenous Every 8 hours 05/25/23 2320 06/01/23 2359   05/25/23 2145  ceFAZolin  (ANCEF ) IVPB 1 g/50 mL premix  Status:  Discontinued        1 g 100 mL/hr over 30 Minutes Intravenous  Once 05/25/23 2143 05/25/23 2320       Subjective: Today, patient was seen and examined at bedside.  Denies interval complaints.  Complains of mild left-sided pain.  No fever chills or rigor.  Objective: Vitals:   05/31/23 2218 06/01/23 0450  BP: (!) 125/58 126/68  Pulse: 64 62  Resp: 16 15  Temp: 98.1 F (36.7 C) 98 F (36.7 C)  SpO2: 97% 97%    Intake/Output Summary (Last 24 hours) at 06/01/2023 0903 Last data filed at 06/01/2023 0849 Gross per 24 hour  Intake 1425.1 ml  Output 3350 ml  Net -1924.9 ml   Filed Weights   05/25/23 1717 05/25/23 1735  Weight: 68.1 kg 68 kg   Body mass index is 22.15 kg/m.   Physical Exam:  GENERAL: Patient is alert awake Communicative,  appears slightly sleepy this morning. HENT: No scleral pallor or icterus. Pupils equally reactive to light. Oral mucosa is moist NECK: is supple, no gross swelling noted. CHEST: Clear to auscultation. No crackles or wheezes.   CVS: S1 and S2 heard, no murmur. Regular rate and rhythm.  ABDOMEN: Soft, non-tender, bowel sounds are present. EXTREMITIES: No edema. CNS: Cranial nerves are intact.  Left hemiparesis with contracture. SKIN: warm and dry without rashes.  Left lower extremity with erythema  superficial skin erosion and mild cellulitis.  Improved from admission.  Data Review: I have personally reviewed the following laboratory data and studies,  CBC: Recent Labs  Lab 05/25/23 1856 05/26/23 0555 05/27/23 0529  WBC 11.9* 10.1 9.8  HGB 13.8 13.8 14.4  HCT 44.7 43.4 46.0  MCV 90.7 90.2 91.3  PLT 262 271 292   Basic Metabolic Panel: Recent Labs  Lab 05/25/23 1856 05/26/23 0555 05/27/23 0529  NA 137 141 137  K 3.2* 3.8 3.5  CL 106 110 104  CO2 23 25 26   GLUCOSE 104* 85 78  BUN 24* 22 25*  CREATININE 0.91 0.74 0.88  CALCIUM  8.1* 8.8* 8.6*  MG  --   --  2.2   Liver Function Tests: Recent Labs  Lab 05/25/23 1856  AST 20  ALT 14  ALKPHOS 92  BILITOT 0.7  PROT 6.3*  ALBUMIN 3.1*   No results for input(s): LIPASE, AMYLASE in the last 168 hours. No results for input(s): AMMONIA in the last 168 hours. Cardiac Enzymes: No results for input(s): CKTOTAL, CKMB, CKMBINDEX, TROPONINI in the last 168 hours. BNP (last 3 results) Recent Labs    10/24/22 0542 10/25/22 0439  BNP 448.0* 419.6*    ProBNP (last 3 results) No results for input(s): PROBNP in the last 8760 hours.  CBG: No results for input(s): GLUCAP in the last 168 hours. Recent Results (from the past 240 hours)  Resp panel by RT-PCR (RSV, Flu A&B, Covid) Anterior Nasal Swab     Status: None   Collection Time: 05/25/23  7:07 PM   Specimen: Anterior Nasal Swab  Result Value Ref Range Status   SARS Coronavirus 2 by RT PCR NEGATIVE NEGATIVE Final    Comment: (NOTE) SARS-CoV-2 target nucleic acids are NOT DETECTED.  The SARS-CoV-2 RNA is generally detectable in upper respiratory specimens during the acute phase of infection. The lowest concentration of SARS-CoV-2 viral copies this assay can detect is 138 copies/mL. A negative result does not preclude SARS-Cov-2 infection and should not be used as the sole basis for treatment or other patient management decisions. A negative  result may occur with  improper specimen collection/handling, submission of specimen other than nasopharyngeal swab, presence of viral mutation(s) within the areas targeted by this assay, and inadequate number of viral copies(<138 copies/mL). A negative result must be combined with clinical observations, patient history, and epidemiological information. The expected result is Negative.  Fact Sheet for Patients:  bloggercourse.com  Fact Sheet for Healthcare Providers:  seriousbroker.it  This test is no t yet approved or cleared by the [place]  FDA and  has been authorized for detection and/or diagnosis of SARS-CoV-2 by FDA under an Emergency Use Authorization (EUA). This EUA will remain  in effect (meaning this test can be used) for the duration of the COVID-19 declaration under Section 564(b)(1) of the Act, 21 U.S.C.section 360bbb-3(b)(1), unless the authorization is terminated  or revoked sooner.       Influenza A by PCR NEGATIVE NEGATIVE Final   Influenza  B by PCR NEGATIVE NEGATIVE Final    Comment: (NOTE) The Xpert Xpress SARS-CoV-2/FLU/RSV plus assay is intended as an aid in the diagnosis of influenza from Nasopharyngeal swab specimens and should not be used as a sole basis for treatment. Nasal washings and aspirates are unacceptable for Xpert Xpress SARS-CoV-2/FLU/RSV testing.  Fact Sheet for Patients: bloggercourse.com  Fact Sheet for Healthcare Providers: seriousbroker.it  This test is not yet approved or cleared by the [place]  FDA and has been authorized for detection and/or diagnosis of SARS-CoV-2 by FDA under an Emergency Use Authorization (EUA). This EUA will remain in effect (meaning this test can be used) for the duration of the COVID-19 declaration under Section 564(b)(1) of the Act, 21 U.S.C. section 360bbb-3(b)(1), unless the authorization is  terminated or revoked.     Resp Syncytial Virus by PCR NEGATIVE NEGATIVE Final    Comment: (NOTE) Fact Sheet for Patients: bloggercourse.com  Fact Sheet for Healthcare Providers: seriousbroker.it  This test is not yet approved or cleared by the [place]  FDA and has been authorized for detection and/or diagnosis of SARS-CoV-2 by FDA under an Emergency Use Authorization (EUA). This EUA will remain in effect (meaning this test can be used) for the duration of the COVID-19 declaration under Section 564(b)(1) of the Act, 21 U.S.C. section 360bbb-3(b)(1), unless the authorization is terminated or revoked.  Performed at [facility], [address] [place], [place] [address]      Studies: No results found.    Deztiny Sarra, MD  [facility] 06/01/2023  If 7PM-7AM, please contact night-coverage

## 2023-06-01 NOTE — TOC Progression Note (Signed)
 Transition of Care [facility]) - Progression Note    Patient Details  Name: Joel Solis MRN: [id number] Date of Birth: 05/10/1946  [facility] CM/SW Contact  NORMAN ASPEN, [place] Phone Number: 06/01/2023, 2:59 PM  Clinical Narrative:     Have spoken with pt's daughter, Damien, today to update on SNF bed search.  Unfortunately, still no bed offers and she is aware that his insurance has very limited number of local facilities in network.   Will continue to keep pt and family updated.  Expected Discharge Plan: Skilled Nursing Facility Barriers to Discharge: SNF Pending bed offer, Insurance Authorization  Expected Discharge Plan and Services In-house Referral: Clinical Social Work   Post Acute Care Choice: Skilled Nursing Facility Living arrangements for the past 2 months: Single Family Home                                       Social Determinants of Health (SDOH) Interventions SDOH Screenings   Food Insecurity: No Food Insecurity (05/26/2023)  Housing: Low Risk  (05/26/2023)  Transportation Needs: Unmet Transportation Needs (05/26/2023)  Utilities: At Risk (05/26/2023)  Social Connections: Socially Isolated (05/26/2023)  Tobacco Use: High Risk (05/25/2023)    Readmission Risk Interventions    05/30/2023    2:53 PM  Readmission Risk Prevention Plan  Post Dischage Appt Complete  Medication Screening Complete  Transportation Screening Complete

## 2023-06-02 DIAGNOSIS — L03314 Cellulitis of groin: Secondary | ICD-10-CM | POA: Diagnosis not present

## 2023-06-02 LAB — BASIC METABOLIC PANEL
Anion gap: 6 (ref 5–15)
BUN: 24 mg/dL — ABNORMAL HIGH (ref 8–23)
CO2: 23 mmol/L (ref 22–32)
Calcium: 8.1 mg/dL — ABNORMAL LOW (ref 8.9–10.3)
Chloride: 108 mmol/L (ref 98–111)
Creatinine, Ser: 1.05 mg/dL (ref 0.61–1.24)
GFR, Estimated: >60 mL/min (ref >60)
Glucose, Bld: 91 mg/dL (ref 70–99)
Potassium: 3.8 mmol/L (ref 3.5–5.1)
Sodium: 137 mmol/L (ref 135–145)

## 2023-06-02 LAB — CBC
HCT: 43.4 % (ref 39.0–52.0)
Hemoglobin: 13.4 g/dL (ref 13.0–17.0)
MCH: 28.5 pg (ref 26.0–34.0)
MCHC: 30.9 g/dL (ref 30.0–36.0)
MCV: 92.1 fL (ref 80.0–100.0)
Platelets: 293 10*3/uL (ref 150–400)
RBC: 4.71 MIL/uL (ref 4.22–5.81)
RDW: 13.3 % (ref 11.5–15.5)
WBC: 8 10*3/uL (ref 4.0–10.5)
nRBC: 0 % (ref 0.0–0.2)

## 2023-06-02 NOTE — Plan of Care (Signed)
  Problem: Nutrition: Goal: Adequate nutrition will be maintained Outcome: Completed/Met

## 2023-06-02 NOTE — Progress Notes (Signed)
 PROGRESS NOTE  [name]:[id number] DOB: [birth year] DOA: 05/25/2023 PCP: [facility]   LOS: 7 days   Brief narrative:  Joel Solis is a 78 y.o. male with medical history significant for hemorrhagic stroke with residual left hemiparesis, nonambulatory uses  power wheelchair at baseline), PAD, HTN, HLD, s/p VP shunt  presented to the ED for evaluation of generalized weakness and pain with infection in the skin.  Patient currently lives alone at home and has a wheelchair for ambulation but has been getting weaker even to get in his wheelchair now.  In the ED, patient had multiple areas of excoriation especially around the perineal area and legs and complained of shooting pain down the legs.  In the ED, vitals were stable.  Lab was notable for potassium low at 3.2.  COVID influenza and RSV was negative.  Chest x-ray was negative for infiltrate.  Patient was given IV Ancef  and potassium and was admitted to hospital with working diagnosis of cellulitis involving left groin and left foot.  Assessment/Plan:  Cellulitis of left groin and left foot: No recent labs  Urinalysis was negative.  Afebrile.  Received IV Keflex  during hospitalization and has completed 7-day course.   Failure to thrive/Progressive weakness. Poor functional status at baseline with worsening.  Lives alone and has a wheelchair.  Currently awaiting  for skilled nursing facility.  Patient is agreeable   Hypokalemia: Improved after replacement.  Latest potassium was 3.8.   History of hemorrhagic stroke with residual left hemiparesis Nonambulatory - uses power wheelchair at baseline: Continue aspirin  and statin.    Hypertension: Continue lisinopril , Lopressor , amlodipine .   Hyperlipidemia: Continue atorvastatin .   DVT prophylaxis: enoxaparin  (LOVENOX ) injection 40 mg Start: 05/26/23 1000   Disposition: Awaiting for skilled nursing facility placement.  Medically stable for  disposition.  Status is: Inpatient  Remains inpatient appropriate because: Need for skilled nursing facility     Code Status:     Code Status: Full Code  Family Communication: Spoke with the patient's daughter on the phone on 06/01/2023  Consultants: None  Procedures: None  Anti-infectives:  Cefazolin  IV  Anti-infectives (From admission, onward)    Start     Dose/Rate Route Frequency Ordered Stop   05/26/23 0000  ceFAZolin  (ANCEF ) IVPB 1 g/50 mL premix        1 g 100 mL/hr over 30 Minutes Intravenous Every 8 hours 05/25/23 2320 06/01/23 1531   05/25/23 2145  ceFAZolin  (ANCEF ) IVPB 1 g/50 mL premix  Status:  Discontinued        1 g 100 mL/hr over 30 Minutes Intravenous  Once 05/25/23 2143 05/25/23 2320       Subjective: Today, patient was seen and examined at bedside.  Denies interval complaints.  Complains of mild pain on the left lower extremity.  Denies any fever chills or rigor.   Objective: Vitals:   06/01/23 2121 06/02/23 0524  BP: 128/71 128/70  Pulse: 64 63  Resp: 18 18  Temp: 98.3 F (36.8 C) 97.7 F (36.5 C)  SpO2: 97% 98%    Intake/Output Summary (Last 24 hours) at 06/02/2023 1115 Last data filed at 06/02/2023 1000 Gross per 24 hour  Intake 1200 ml  Output 3125 ml  Net -1925 ml   Filed Weights   05/25/23 1717 05/25/23 1735  Weight: 68.1 kg 68 kg   Body mass index is 22.15 kg/m.   Physical Exam:  GENERAL: Patient is alert awake Communicative, HENT: No scleral pallor or icterus. Pupils  equally reactive to light. Oral mucosa is moist NECK: is supple, no gross swelling noted. CHEST: Clear to auscultation. No crackles or wheezes.   CVS: S1 and S2 heard, no murmur. Regular rate and rhythm.  ABDOMEN: Soft, non-tender, bowel sounds are present. EXTREMITIES: No edema. CNS: Cranial nerves are intact.  Left hemiparesis with contracture. SKIN: warm and dry without rashes.  Left lower extremity with erythema superficial skin erosion and mild  cellulitis.  Improved from admission.  Data Review: I have personally reviewed the following laboratory data and studies,  CBC: Recent Labs  Lab 05/27/23 0529 06/02/23 0450  WBC 9.8 8.0  HGB 14.4 13.4  HCT 46.0 43.4  MCV 91.3 92.1  PLT 292 293   Basic Metabolic Panel: Recent Labs  Lab 05/27/23 0529 06/02/23 0450  NA 137 137  K 3.5 3.8  CL 104 108  CO2 26 23  GLUCOSE 78 91  BUN 25* 24*  CREATININE 0.88 1.05  CALCIUM  8.6* 8.1*  MG 2.2  --    Liver Function Tests: No results for input(s): AST, ALT, ALKPHOS, BILITOT, PROT, ALBUMIN in the last 168 hours.  No results for input(s): LIPASE, AMYLASE in the last 168 hours. No results for input(s): AMMONIA in the last 168 hours. Cardiac Enzymes: No results for input(s): CKTOTAL, CKMB, CKMBINDEX, TROPONINI in the last 168 hours. BNP (last 3 results) Recent Labs    10/24/22 0542 10/25/22 0439  BNP 448.0* 419.6*    ProBNP (last 3 results) No results for input(s): PROBNP in the last 8760 hours.  CBG: No results for input(s): GLUCAP in the last 168 hours. Recent Results (from the past 240 hours)  Resp panel by RT-PCR (RSV, Flu A&B, Covid) Anterior Nasal Swab     Status: None   Collection Time: 05/25/23  7:07 PM   Specimen: Anterior Nasal Swab  Result Value Ref Range Status   SARS Coronavirus 2 by RT PCR NEGATIVE NEGATIVE Final    Comment: (NOTE) SARS-CoV-2 target nucleic acids are NOT DETECTED.  The SARS-CoV-2 RNA is generally detectable in upper respiratory specimens during the acute phase of infection. The lowest concentration of SARS-CoV-2 viral copies this assay can detect is 138 copies/mL. A negative result does not preclude SARS-Cov-2 infection and should not be used as the sole basis for treatment or other patient management decisions. A negative result may occur with  improper specimen collection/handling, submission of specimen other than nasopharyngeal swab, presence of viral  mutation(s) within the areas targeted by this assay, and inadequate number of viral copies(<138 copies/mL). A negative result must be combined with clinical observations, patient history, and epidemiological information. The expected result is Negative.  Fact Sheet for Patients:  bloggercourse.com  Fact Sheet for Healthcare Providers:  seriousbroker.it  This test is no t yet approved or cleared by the [place]  FDA and  has been authorized for detection and/or diagnosis of SARS-CoV-2 by FDA under an Emergency Use Authorization (EUA). This EUA will remain  in effect (meaning this test can be used) for the duration of the COVID-19 declaration under Section 564(b)(1) of the Act, 21 U.S.C.section 360bbb-3(b)(1), unless the authorization is terminated  or revoked sooner.       Influenza A by PCR NEGATIVE NEGATIVE Final   Influenza B by PCR NEGATIVE NEGATIVE Final    Comment: (NOTE) The Xpert Xpress SARS-CoV-2/FLU/RSV plus assay is intended as an aid in the diagnosis of influenza from Nasopharyngeal swab specimens and should not be used as a sole basis for treatment.  Nasal washings and aspirates are unacceptable for Xpert Xpress SARS-CoV-2/FLU/RSV testing.  Fact Sheet for Patients: bloggercourse.com  Fact Sheet for Healthcare Providers: seriousbroker.it  This test is not yet approved or cleared by the [place]  FDA and has been authorized for detection and/or diagnosis of SARS-CoV-2 by FDA under an Emergency Use Authorization (EUA). This EUA will remain in effect (meaning this test can be used) for the duration of the COVID-19 declaration under Section 564(b)(1) of the Act, 21 U.S.C. section 360bbb-3(b)(1), unless the authorization is terminated or revoked.     Resp Syncytial Virus by PCR NEGATIVE NEGATIVE Final    Comment: (NOTE) Fact Sheet for  Patients: bloggercourse.com  Fact Sheet for Healthcare Providers: seriousbroker.it  This test is not yet approved or cleared by the [place]  FDA and has been authorized for detection and/or diagnosis of SARS-CoV-2 by FDA under an Emergency Use Authorization (EUA). This EUA will remain in effect (meaning this test can be used) for the duration of the COVID-19 declaration under Section 564(b)(1) of the Act, 21 U.S.C. section 360bbb-3(b)(1), unless the authorization is terminated or revoked.  Performed at [facility], [address], [place], [place] [address]      Studies: No results found.    Blakelynn Scheeler, MD  [facility] 06/02/2023  If 7PM-7AM, please contact night-coverage

## 2023-06-02 NOTE — Progress Notes (Signed)
 Physical Therapy Treatment Patient Details Name: Joel Solis MRN: [id number] DOB: 02/18/1946 Today's Date: 06/02/2023   History of Present Illness Patient is a 78 y.o. male with presented to the ED for evaluation of generalized weakness, pain all over, skin infection, admitted with cellulitis. PMH: hemorrhagic stroke with residual left hemiparesis (nonambulatory, uses power wheelchair at baseline), PAD, HTN, HLD, s/p VP shunt who    PT Comments  General Comments: AxO x 3 pleasant and more willing to get OOB this session.  Feeling better. Stated he had his stroke in 2011.  He lives alone with his cat and a care giver comes a few times a week.  He is non amb but has a power wc for mobility.  He also has a transfer pole floor to ceiling he uses to get in/OOB. Assisted OOB to recliner via lateral scooting.  General bed mobility comments: Required Max Assist for upper body as well as use of rail and HOB elevated.  Also required assist using bed pad to complete scooting to EOB.  Lateral/Scoot Transfers: +2 physical assistance, +2 safety/equipment, Max assist, Total assist.  Positioned in recliner to comfort. Pt will need ST Rehab at SNF to address mobility and functional decline prior to safely returning home.    If plan is discharge home, recommend the following: Two people to help with walking and/or transfers;Two people to help with bathing/dressing/bathroom;Help with stairs or ramp for entrance;Assist for transportation;Assistance with cooking/housework   Can travel by private vehicle        Equipment Recommendations  None recommended by PT    Recommendations for Other Services       Precautions / Restrictions Precautions Precautions: Fall Precaution Comments: Hx CVA L side weakness/contractures Restrictions Weight Bearing Restrictions Per Provider Order: No     Mobility  Bed Mobility Overal bed mobility: Needs Assistance Bed Mobility: Supine to Sit     Supine to sit: Max  assist     General bed mobility comments: Required Max Assist for upper body as well as use of rail and HOB elevated.  Also required assist using bed pad to complete scooting to EOB.    Transfers Overall transfer level: Needs assistance Equipment used: None Transfers: Bed to chair/wheelchair/BSC            Lateral/Scoot Transfers: +2 physical assistance, +2 safety/equipment, Max assist, Total assist General transfer comment: assisted from bed to drop arm recliner lateral scoot + 2 asisst (anterior/posterior? using bed pad to complete.  Rec Maxi Lift back to bed.  Pad in place in recliner.    Ambulation/Gait               General Gait Details: non amb   Stairs             Wheelchair Mobility     Tilt Bed    Modified Rankin (Stroke Patients Only)       Balance                                            Cognition Arousal: Alert   Overall Cognitive Status: Within Functional Limits for tasks assessed                                 General Comments: AxO x 3 pleasant and more willing to get OOB  this session.  Feeling better. Stated he had his stroke in 2011.  He lives alone with his cat and a care giver comes a few times a week.  He is non amb but has a power wc for mobility.  He also has a transfer pole floor to ceiling he uses to get in/OOB.        Exercises      General Comments        Pertinent Vitals/Pain Pain Assessment Pain Assessment: Faces Faces Pain Scale: Hurts a little bit Pain Location: L LE with attempts to straighten/uncross legs Pain Descriptors / Indicators: Grimacing, Guarding Pain Intervention(s): Monitored during session, Repositioned    Home Living                          Prior Function            PT Goals (current goals can now be found in the care plan section) Progress towards PT goals: Progressing toward goals    Frequency    Min 1X/week      PT Plan       Co-evaluation              AM-PAC PT 6 Clicks Mobility   Outcome Measure    Help needed moving from lying on your back to sitting on the side of a flat bed without using bedrails?: A Lot Help needed moving to and from a bed to a chair (including a wheelchair)?: A Lot Help needed standing up from a chair using your arms (e.g., wheelchair or bedside chair)?: A Lot Help needed to walk in hospital room?: Total Help needed climbing 3-5 steps with a railing? : Total 6 Click Score: 8    End of Session Equipment Utilized During Treatment: Gait belt   Patient left: in chair;with call bell/phone within reach;with nursing/sitter in room Nurse Communication: Mobility status PT Visit Diagnosis: Hemiplegia and hemiparesis;Other abnormalities of gait and mobility (R26.89) Hemiplegia - Right/Left: Left Hemiplegia - caused by: Cerebral infarction     Time: 9154-9086 PT Time Calculation (min) (ACUTE ONLY): 28 min  Charges:    $Therapeutic Activity: 23-37 mins PT General Charges $$ ACUTE PT VISIT: 1 Visit                     Katheryn Leap  PTA Acute  Rehabilitation Services Office M-F          [phone]

## 2023-06-02 NOTE — Plan of Care (Signed)
   Problem: Health Behavior/Discharge Planning: Goal: Ability to manage health-related needs will improve Outcome: Progressing

## 2023-06-03 DIAGNOSIS — L03314 Cellulitis of groin: Secondary | ICD-10-CM | POA: Diagnosis not present

## 2023-06-03 NOTE — Progress Notes (Signed)
 PROGRESS NOTE  Joel Solis FMW:[id number] DOB: 1945-07-28 DOA: 05/25/2023 PCP: [facility]   LOS: 8 days   Brief narrative:  Joel Solis is a 78 y.o. male with medical history significant for hemorrhagic stroke with residual left hemiparesis, nonambulatory uses  power wheelchair at baseline), PAD, HTN, HLD, s/p VP shunt  presented to the ED for evaluation of generalized weakness and pain with infection in the skin.  Patient currently lives alone at home and has a wheelchair for ambulation but has been getting weaker even to get in his wheelchair now.  In the ED, patient had multiple areas of excoriation especially around the perineal area and legs and complained of shooting pain down the legs.  In the ED, vitals were stable.  Lab was notable for potassium low at 3.2.  COVID influenza and RSV was negative.  Chest x-ray was negative for infiltrate.  Patient was given IV Ancef  and potassium and was admitted to hospital with working diagnosis of cellulitis involving left groin and left foot.  Assessment/Plan:  Cellulitis of left groin and left foot: No recent labs  Urinalysis was negative.  Afebrile.  Received IV Keflex  during hospitalization and has completed 7-day course.   Failure to thrive/Progressive weakness. Poor functional status at baseline with worsening.  Lives alone and has a wheelchair.  Currently awaiting  for skilled nursing facility.  Patient is agreeable   Hypokalemia: Improved after replacement.  Latest potassium was 3.8.   History of hemorrhagic stroke with residual left hemiparesis Nonambulatory - uses power wheelchair at baseline: Continue aspirin  and statin.    Hypertension: Continue lisinopril , Lopressor , amlodipine .   Hyperlipidemia: Continue atorvastatin .   DVT prophylaxis: enoxaparin  (LOVENOX ) injection 40 mg Start: 05/26/23 1000   Disposition: Awaiting for skilled nursing facility placement.  Medically stable for  disposition.  Status is: Inpatient  Remains inpatient appropriate because: Need for skilled nursing facility     Code Status:     Code Status: Full Code  Family Communication: Spoke with the patient's daughter on the phone on 06/01/2023  Consultants: None  Procedures: None  Anti-infectives:  None   Anti-infectives (From admission, onward)    Start     Dose/Rate Route Frequency Ordered Stop   05/26/23 0000  ceFAZolin  (ANCEF ) IVPB 1 g/50 mL premix        1 g 100 mL/hr over 30 Minutes Intravenous Every 8 hours 05/25/23 2320 06/01/23 1531   05/25/23 2145  ceFAZolin  (ANCEF ) IVPB 1 g/50 mL premix  Status:  Discontinued        1 g 100 mL/hr over 30 Minutes Intravenous  Once 05/25/23 2143 05/25/23 2320       Subjective: Today, patient was seen and examined at bedside.  Inquiring about disposition.  Complains of left lower extremity pain.    Objective: Vitals:   06/02/23 2141 06/03/23 0613  BP: (!) 155/78 133/69  Pulse: 79 63  Resp: 16 14  Temp: 98.3 F (36.8 C)   SpO2: 96% 97%    Intake/Output Summary (Last 24 hours) at 06/03/2023 1113 Last data filed at 06/03/2023 0600 Gross per 24 hour  Intake 1495 ml  Output 2500 ml  Net -1005 ml   Filed Weights   05/25/23 1717 05/25/23 1735  Weight: 68.1 kg 68 kg   Body mass index is 22.15 kg/m.   Physical Exam:  GENERAL: Patient is alert awake Communicative, HENT: No scleral pallor or icterus. Pupils equally reactive to light. Oral mucosa is moist NECK: is supple,  no gross swelling noted. CHEST: Clear to auscultation. No crackles or wheezes.   CVS: S1 and S2 heard, no murmur. Regular rate and rhythm.  ABDOMEN: Soft, non-tender, bowel sounds are present. EXTREMITIES: No edema. CNS: Cranial nerves are intact.  Left hemiparesis with contracture. SKIN: warm and dry without rashes.  Left lower extremity with erythema superficial skin erosion and mild cellulitis.  Improved   Data Review: I have personally reviewed the  following laboratory data and studies,  CBC: Recent Labs  Lab 06/02/23 0450  WBC 8.0  HGB 13.4  HCT 43.4  MCV 92.1  PLT 293   Basic Metabolic Panel: Recent Labs  Lab 06/02/23 0450  NA 137  K 3.8  CL 108  CO2 23  GLUCOSE 91  BUN 24*  CREATININE 1.05  CALCIUM  8.1*   Liver Function Tests: No results for input(s): AST, ALT, ALKPHOS, BILITOT, PROT, ALBUMIN in the last 168 hours.  No results for input(s): LIPASE, AMYLASE in the last 168 hours. No results for input(s): AMMONIA in the last 168 hours. Cardiac Enzymes: No results for input(s): CKTOTAL, CKMB, CKMBINDEX, TROPONINI in the last 168 hours. BNP (last 3 results) Recent Labs    10/24/22 0542 10/25/22 0439  BNP 448.0* 419.6*    ProBNP (last 3 results) No results for input(s): PROBNP in the last 8760 hours.  CBG: No results for input(s): GLUCAP in the last 168 hours. Recent Results (from the past 240 hours)  Resp panel by RT-PCR (RSV, Flu A&B, Covid) Anterior Nasal Swab     Status: None   Collection Time: 05/25/23  7:07 PM   Specimen: Anterior Nasal Swab  Result Value Ref Range Status   SARS Coronavirus 2 by RT PCR NEGATIVE NEGATIVE Final    Comment: (NOTE) SARS-CoV-2 target nucleic acids are NOT DETECTED.  The SARS-CoV-2 RNA is generally detectable in upper respiratory specimens during the acute phase of infection. The lowest concentration of SARS-CoV-2 viral copies this assay can detect is 138 copies/mL. A negative result does not preclude SARS-Cov-2 infection and should not be used as the sole basis for treatment or other patient management decisions. A negative result may occur with  improper specimen collection/handling, submission of specimen other than nasopharyngeal swab, presence of viral mutation(s) within the areas targeted by this assay, and inadequate number of viral copies(<138 copies/mL). A negative result must be combined with clinical observations, patient  history, and epidemiological information. The expected result is Negative.  Fact Sheet for Patients:  bloggercourse.com  Fact Sheet for Healthcare Providers:  seriousbroker.it  This test is no t yet approved or cleared by the [place]  FDA and  has been authorized for detection and/or diagnosis of SARS-CoV-2 by FDA under an Emergency Use Authorization (EUA). This EUA will remain  in effect (meaning this test can be used) for the duration of the COVID-19 declaration under Section 564(b)(1) of the Act, 21 U.S.C.section 360bbb-3(b)(1), unless the authorization is terminated  or revoked sooner.       Influenza A by PCR NEGATIVE NEGATIVE Final   Influenza B by PCR NEGATIVE NEGATIVE Final    Comment: (NOTE) The Xpert Xpress SARS-CoV-2/FLU/RSV plus assay is intended as an aid in the diagnosis of influenza from Nasopharyngeal swab specimens and should not be used as a sole basis for treatment. Nasal washings and aspirates are unacceptable for Xpert Xpress SARS-CoV-2/FLU/RSV testing.  Fact Sheet for Patients: bloggercourse.com  Fact Sheet for Healthcare Providers: seriousbroker.it  This test is not yet approved or cleared by the  [place]  FDA and has been authorized for detection and/or diagnosis of SARS-CoV-2 by FDA under an Emergency Use Authorization (EUA). This EUA will remain in effect (meaning this test can be used) for the duration of the COVID-19 declaration under Section 564(b)(1) of the Act, 21 U.S.C. section 360bbb-3(b)(1), unless the authorization is terminated or revoked.     Resp Syncytial Virus by PCR NEGATIVE NEGATIVE Final    Comment: (NOTE) Fact Sheet for Patients: bloggercourse.com  Fact Sheet for Healthcare Providers: seriousbroker.it  This test is not yet approved or cleared by the [place]  FDA  and has been authorized for detection and/or diagnosis of SARS-CoV-2 by FDA under an Emergency Use Authorization (EUA). This EUA will remain in effect (meaning this test can be used) for the duration of the COVID-19 declaration under Section 564(b)(1) of the Act, 21 U.S.C. section 360bbb-3(b)(1), unless the authorization is terminated or revoked.  Performed at [facility], [address] [place], [place] [address]      Studies: No results found.    Kylee Nardozzi, MD  [facility] 06/03/2023  If 7PM-7AM, please contact night-coverage

## 2023-06-03 NOTE — Plan of Care (Signed)
   Problem: Coping: Goal: Level of anxiety will decrease Outcome: Progressing

## 2023-06-04 DIAGNOSIS — L03314 Cellulitis of groin: Secondary | ICD-10-CM | POA: Diagnosis not present

## 2023-06-04 MED ORDER — HYDROCODONE-ACETAMINOPHEN 5-325 MG PO TABS
1.0000 | ORAL_TABLET | ORAL | Status: DC | PRN
  Administered 2023-06-04 – 2023-06-07 (×2): 1 via ORAL
  Filled 2023-06-04 (×2): qty 1

## 2023-06-04 MED ORDER — DICLOFENAC SODIUM 1 % EX GEL
4.0000 g | Freq: Four times a day (QID) | CUTANEOUS | Status: DC | PRN
  Administered 2023-06-04: 4 g via TOPICAL
  Filled 2023-06-04: qty 100

## 2023-06-04 NOTE — Progress Notes (Signed)
 PROGRESS NOTE  Kaiser [name]:[id number] DOB: Aug 17, 1945 DOA: 05/25/2023 PCP: [facility]   LOS: 9 days   Brief narrative:  Joel Solis is a 78 y.o. male with medical history significant for hemorrhagic stroke with residual left hemiparesis, nonambulatory uses  power wheelchair at baseline), PAD, HTN, HLD, s/p VP shunt  presented to the ED for evaluation of generalized weakness and pain with infection in the skin.  Patient currently lives alone at home and has a wheelchair for ambulation but has been getting weaker even to get in his wheelchair now.  In the ED, patient had multiple areas of excoriation especially around the perineal area and legs and complained of shooting pain down the legs.  In the ED, vitals were stable.  Lab was notable for potassium low at 3.2.  COVID influenza and RSV was negative.  Chest x-ray was negative for infiltrate.  Patient was given IV Ancef  and potassium and was admitted to hospital with working diagnosis of cellulitis involving left groin and left foot. Assessment/Plan:  Cellulitis of left groin and left foot: Completed 7-day course of IV cefazolin  during hospitalization.  Has improved at this time.   Failure to thrive/Progressive weakness. Poor functional status at baseline with worsening.  Lives alone and has a wheelchair.  Currently awaiting  for skilled nursing facility.    Hypokalemia: Improved after replacement.  Latest potassium was 3.8.   History of hemorrhagic stroke with residual left hemiparesis Nonambulatory - uses power wheelchair at baseline: Continue aspirin  and statin.    Hypertension: Continue lisinopril , Lopressor , amlodipine .  Blood pressure seems to be stable.   Hyperlipidemia: Continue atorvastatin .   DVT prophylaxis: enoxaparin  (LOVENOX ) injection 40 mg Start: 05/26/23 1000   Disposition: Awaiting for skilled nursing facility placement.  Medically stable for disposition.  Status is:  Inpatient  Remains inpatient appropriate because: Need for skilled nursing facility     Code Status:     Code Status: Full Code  Family Communication: Spoke with the patient's daughter on the phone on 06/01/2023  Consultants: None  Procedures: None  Anti-infectives:  None   Anti-infectives (From admission, onward)    Start     Dose/Rate Route Frequency Ordered Stop   05/26/23 0000  ceFAZolin  (ANCEF ) IVPB 1 g/50 mL premix        1 g 100 mL/hr over 30 Minutes Intravenous Every 8 hours 05/25/23 2320 06/01/23 1531   05/25/23 2145  ceFAZolin  (ANCEF ) IVPB 1 g/50 mL premix  Status:  Discontinued        1 g 100 mL/hr over 30 Minutes Intravenous  Once 05/25/23 2143 05/25/23 2320       Subjective: Today, patient was seen and examined at bedside.  Denies interval complaints.  Mild lower extremity pain on the left side.  Objective: Vitals:   06/04/23 0538 06/04/23 0559  BP: 127/69   Pulse: (!) 58 61  Resp: 19   Temp: 97.6 F (36.4 C)   SpO2: 98%     Intake/Output Summary (Last 24 hours) at 06/04/2023 1003 Last data filed at 06/04/2023 0602 Gross per 24 hour  Intake 1740 ml  Output 1800 ml  Net -60 ml   Filed Weights   05/25/23 1717 05/25/23 1735  Weight: 68.1 kg 68 kg   Body mass index is 22.15 kg/m.   Physical Exam:  GENERAL: Patient is alert awake Communicative, HENT: No scleral pallor or icterus. Pupils equally reactive to light. Oral mucosa is moist NECK: is supple, no gross swelling  noted. CHEST: Clear to auscultation. No crackles or wheezes.   CVS: S1 and S2 heard, no murmur. Regular rate and rhythm.  ABDOMEN: Soft, non-tender, bowel sounds are present. EXTREMITIES: No edema. CNS: Cranial nerves are intact.  Left hemiparesis with contracture. SKIN: warm and dry without rashes.  Left lower extremity with erythema superficial skin erosion and mild cellulitis.  Improved   Data Review: I have personally reviewed the following laboratory data and  studies,  CBC: Recent Labs  Lab 06/02/23 0450  WBC 8.0  HGB 13.4  HCT 43.4  MCV 92.1  PLT 293   Basic Metabolic Panel: Recent Labs  Lab 06/02/23 0450  NA 137  K 3.8  CL 108  CO2 23  GLUCOSE 91  BUN 24*  CREATININE 1.05  CALCIUM  8.1*   Liver Function Tests: No results for input(s): AST, ALT, ALKPHOS, BILITOT, PROT, ALBUMIN in the last 168 hours.  No results for input(s): LIPASE, AMYLASE in the last 168 hours. No results for input(s): AMMONIA in the last 168 hours. Cardiac Enzymes: No results for input(s): CKTOTAL, CKMB, CKMBINDEX, TROPONINI in the last 168 hours. BNP (last 3 results) Recent Labs    10/24/22 0542 10/25/22 0439  BNP 448.0* 419.6*    ProBNP (last 3 results) No results for input(s): PROBNP in the last 8760 hours.  CBG: No results for input(s): GLUCAP in the last 168 hours. Recent Results (from the past 240 hours)  Resp panel by RT-PCR (RSV, Flu A&B, Covid) Anterior Nasal Swab     Status: None   Collection Time: 05/25/23  7:07 PM   Specimen: Anterior Nasal Swab  Result Value Ref Range Status   SARS Coronavirus 2 by RT PCR NEGATIVE NEGATIVE Final    Comment: (NOTE) SARS-CoV-2 target nucleic acids are NOT DETECTED.  The SARS-CoV-2 RNA is generally detectable in upper respiratory specimens during the acute phase of infection. The lowest concentration of SARS-CoV-2 viral copies this assay can detect is 138 copies/mL. A negative result does not preclude SARS-Cov-2 infection and should not be used as the sole basis for treatment or other patient management decisions. A negative result may occur with  improper specimen collection/handling, submission of specimen other than nasopharyngeal swab, presence of viral mutation(s) within the areas targeted by this assay, and inadequate number of viral copies(<138 copies/mL). A negative result must be combined with clinical observations, patient history, and  epidemiological information. The expected result is Negative.  Fact Sheet for Patients:  bloggercourse.com  Fact Sheet for Healthcare Providers:  seriousbroker.it  This test is no t yet approved or cleared by the [place]  FDA and  has been authorized for detection and/or diagnosis of SARS-CoV-2 by FDA under an Emergency Use Authorization (EUA). This EUA will remain  in effect (meaning this test can be used) for the duration of the COVID-19 declaration under Section 564(b)(1) of the Act, 21 U.S.C.section 360bbb-3(b)(1), unless the authorization is terminated  or revoked sooner.       Influenza A by PCR NEGATIVE NEGATIVE Final   Influenza B by PCR NEGATIVE NEGATIVE Final    Comment: (NOTE) The Xpert Xpress SARS-CoV-2/FLU/RSV plus assay is intended as an aid in the diagnosis of influenza from Nasopharyngeal swab specimens and should not be used as a sole basis for treatment. Nasal washings and aspirates are unacceptable for Xpert Xpress SARS-CoV-2/FLU/RSV testing.  Fact Sheet for Patients: bloggercourse.com  Fact Sheet for Healthcare Providers: seriousbroker.it  This test is not yet approved or cleared by the [place]  FDA  and has been authorized for detection and/or diagnosis of SARS-CoV-2 by FDA under an Emergency Use Authorization (EUA). This EUA will remain in effect (meaning this test can be used) for the duration of the COVID-19 declaration under Section 564(b)(1) of the Act, 21 U.S.C. section 360bbb-3(b)(1), unless the authorization is terminated or revoked.     Resp Syncytial Virus by PCR NEGATIVE NEGATIVE Final    Comment: (NOTE) Fact Sheet for Patients: bloggercourse.com  Fact Sheet for Healthcare Providers: seriousbroker.it  This test is not yet approved or cleared by the [place]  FDA and has been  authorized for detection and/or diagnosis of SARS-CoV-2 by FDA under an Emergency Use Authorization (EUA). This EUA will remain in effect (meaning this test can be used) for the duration of the COVID-19 declaration under Section 564(b)(1) of the Act, 21 U.S.C. section 360bbb-3(b)(1), unless the authorization is terminated or revoked.  Performed at [facility], [address] [place], [place] [address]      Studies: No results found.    Gil Ingwersen, MD  [facility] 06/04/2023  If 7PM-7AM, please contact night-coverage

## 2023-06-04 NOTE — Plan of Care (Signed)

## 2023-06-05 DIAGNOSIS — L03314 Cellulitis of groin: Secondary | ICD-10-CM | POA: Diagnosis not present

## 2023-06-05 NOTE — Progress Notes (Signed)
 PROGRESS NOTE  Joel Solis FMW:[id number] DOB: 10/25/1945 DOA: 05/25/2023 PCP: [facility]   LOS: 10 days   Brief narrative:  Joel Solis is a 78 y.o. male with medical history significant for hemorrhagic stroke with residual left hemiparesis, nonambulatory uses  power wheelchair at baseline), PAD, HTN, HLD, s/p VP shunt  presented to the ED for evaluation of generalized weakness and pain with infection in the skin.  Patient currently lives alone at home and has a wheelchair for ambulation but has been getting weaker even to get in his wheelchair now.  In the ED, patient had multiple areas of excoriation especially around the perineal area and legs and complained of shooting pain down the legs.  In the ED, vitals were stable.  Lab was notable for potassium low at 3.2.  COVID influenza and RSV was negative.  Chest x-ray was negative for infiltrate.  Patient was given IV Ancef  and potassium and was admitted to hospital with working diagnosis of cellulitis involving left groin and left foot.  Assessment/Plan:  Cellulitis of left groin and left foot: Completed 7-day course of IV cefazolin  during hospitalization.  Has improved at this time.   Failure to thrive/Progressive weakness. Poor functional status at baseline with worsening.  Lives alone and has a wheelchair.  Currently awaiting  for skilled nursing facility.    Hypokalemia: Improved after replacement.  Latest potassium was 3.8.  Check BMP in AM.   History of hemorrhagic stroke with residual left hemiparesis Nonambulatory - uses power wheelchair at baseline: Continue aspirin  and statin.    Hypertension: Continue lisinopril , Lopressor , amlodipine .  Blood pressure seems to be stable.   Hyperlipidemia: Continue atorvastatin .   DVT prophylaxis: enoxaparin  (LOVENOX ) injection 40 mg Start: 05/26/23 1000   Disposition: Awaiting for skilled nursing facility placement.  Medically stable for  disposition.  Status is: Inpatient  Remains inpatient appropriate because: Need for skilled nursing facility     Code Status:     Code Status: Full Code  Family Communication: Spoke with the patient's daughter on the phone on 06/01/2023  Consultants: None  Procedures: None  Anti-infectives:  None   Anti-infectives (From admission, onward)    Start     Dose/Rate Route Frequency Ordered Stop   05/26/23 0000  ceFAZolin  (ANCEF ) IVPB 1 g/50 mL premix        1 g 100 mL/hr over 30 Minutes Intravenous Every 8 hours 05/25/23 2320 06/01/23 1531   05/25/23 2145  ceFAZolin  (ANCEF ) IVPB 1 g/50 mL premix  Status:  Discontinued        1 g 100 mL/hr over 30 Minutes Intravenous  Once 05/25/23 2143 05/25/23 2320       Subjective: Today, patient was seen and examined at bedside.  Complains of mild left lower extremity pain on the left side.  Inquiring about his discharge.  Objective: Vitals:   06/04/23 2132 06/05/23 0640  BP: 117/65 116/69  Pulse: 65 (!) 59  Resp: 17 14  Temp: 98.3 F (36.8 C) 97.7 F (36.5 C)  SpO2: 94% 97%    Intake/Output Summary (Last 24 hours) at 06/05/2023 1104 Last data filed at 06/05/2023 1000 Gross per 24 hour  Intake 1360 ml  Output 2000 ml  Net -640 ml   Filed Weights   05/25/23 1717 05/25/23 1735  Weight: 68.1 kg 68 kg   Body mass index is 22.15 kg/m.   Physical Exam:  GENERAL: Patient is alert awake oriented HENT: No scleral pallor or icterus. Pupils equally  reactive to light. Oral mucosa is moist NECK: is supple, no gross swelling noted. CHEST: Clear to auscultation. No crackles or wheezes.   CVS: S1 and S2 heard, no murmur. Regular rate and rhythm.  ABDOMEN: Soft, non-tender, bowel sounds are present. EXTREMITIES: No edema. CNS: Cranial nerves are intact.  Left hemiparesis with contracture. SKIN: warm and dry without rashes.  Left lower extremity with erythema superficial skin erosion and mild cellulitis.  Improved   Data Review: I  have personally reviewed the following laboratory data and studies,  CBC: Recent Labs  Lab 06/02/23 0450  WBC 8.0  HGB 13.4  HCT 43.4  MCV 92.1  PLT 293   Basic Metabolic Panel: Recent Labs  Lab 06/02/23 0450  NA 137  K 3.8  CL 108  CO2 23  GLUCOSE 91  BUN 24*  CREATININE 1.05  CALCIUM  8.1*   Liver Function Tests: No results for input(s): AST, ALT, ALKPHOS, BILITOT, PROT, ALBUMIN in the last 168 hours.  No results for input(s): LIPASE, AMYLASE in the last 168 hours. No results for input(s): AMMONIA in the last 168 hours. Cardiac Enzymes: No results for input(s): CKTOTAL, CKMB, CKMBINDEX, TROPONINI in the last 168 hours. BNP (last 3 results) Recent Labs    10/24/22 0542 10/25/22 0439  BNP 448.0* 419.6*    ProBNP (last 3 results) No results for input(s): PROBNP in the last 8760 hours.  CBG: No results for input(s): GLUCAP in the last 168 hours. No results found for this or any previous visit (from the past 240 hours).    Studies: No results found.    Dacian Orrico, MD  [facility] 06/05/2023  If 7PM-7AM, please contact night-coverage

## 2023-06-05 NOTE — TOC Progression Note (Addendum)
 Transition of Care [facility] - Progression Note    Patient Details  Name: Joel Solis MRN: [id number] Date of Birth: Sep 17, 1945  [facility] CM/SW Contact  NORMAN ASPEN, LCSW Phone Number: 06/05/2023, 2:35 PM  Clinical Narrative:     Still no SNF bed offers.  Have reached out to two facilities who have indicated considering. Pt needs to continue working with our therapies since his insurance coverage has VERY limited number of facilities in the network.  ADDENDUM: Have just received SNF bed offer from [facility]  Pt and daughter have accepted and facility will begin insurance authorization. MD and RN aware.  Expected Discharge Plan: Skilled Nursing Facility Barriers to Discharge: SNF Pending bed offer, Insurance Authorization  Expected Discharge Plan and Services In-house Referral: Clinical Social Work   Post Acute Care Choice: Skilled Nursing Facility Living arrangements for the past 2 months: Single Family Home                                       Social Determinants of Health (SDOH) Interventions SDOH Screenings   Food Insecurity: No Food Insecurity (05/26/2023)  Housing: Low Risk  (05/26/2023)  Transportation Needs: Unmet Transportation Needs (05/26/2023)  Utilities: At Risk (05/26/2023)  Social Connections: Socially Isolated (05/26/2023)  Tobacco Use: High Risk (05/25/2023)    Readmission Risk Interventions    05/30/2023    2:53 PM  Readmission Risk Prevention Plan  Post Dischage Appt Complete  Medication Screening Complete  Transportation Screening Complete

## 2023-06-05 NOTE — Plan of Care (Signed)
   Problem: Education: Goal: Knowledge of General Education information will improve Description Including pain rating scale, medication(s)/side effects and non-pharmacologic comfort measures Outcome: Progressing

## 2023-06-06 DIAGNOSIS — L03314 Cellulitis of groin: Secondary | ICD-10-CM | POA: Diagnosis not present

## 2023-06-06 LAB — CBC
HCT: 40.8 % (ref 39.0–52.0)
Hemoglobin: 12.8 g/dL — ABNORMAL LOW (ref 13.0–17.0)
MCH: 28.4 pg (ref 26.0–34.0)
MCHC: 31.4 g/dL (ref 30.0–36.0)
MCV: 90.7 fL (ref 80.0–100.0)
Platelets: 276 10*3/uL (ref 150–400)
RBC: 4.5 MIL/uL (ref 4.22–5.81)
RDW: 13.5 % (ref 11.5–15.5)
WBC: 7.1 10*3/uL (ref 4.0–10.5)
nRBC: 0 % (ref 0.0–0.2)

## 2023-06-06 LAB — BASIC METABOLIC PANEL
Anion gap: 7 (ref 5–15)
BUN: 22 mg/dL (ref 8–23)
CO2: 21 mmol/L — ABNORMAL LOW (ref 22–32)
Calcium: 8.1 mg/dL — ABNORMAL LOW (ref 8.9–10.3)
Chloride: 108 mmol/L (ref 98–111)
Creatinine, Ser: 1.07 mg/dL (ref 0.61–1.24)
GFR, Estimated: >60 mL/min (ref >60)
Glucose, Bld: 81 mg/dL (ref 70–99)
Potassium: 3.4 mmol/L — ABNORMAL LOW (ref 3.5–5.1)
Sodium: 136 mmol/L (ref 135–145)

## 2023-06-06 MED ORDER — POTASSIUM CHLORIDE CRYS ER 20 MEQ PO TBCR
40.0000 meq | EXTENDED_RELEASE_TABLET | Freq: Once | ORAL | Status: AC
  Administered 2023-06-06: 40 meq via ORAL
  Filled 2023-06-06: qty 2

## 2023-06-06 NOTE — Plan of Care (Signed)
   Problem: Education: Goal: Knowledge of General Education information will improve Description Including pain rating scale, medication(s)/side effects and non-pharmacologic comfort measures Outcome: Progressing

## 2023-06-06 NOTE — Plan of Care (Signed)
  Problem: Safety: Goal: Ability to remain free from injury will improve Outcome: Progressing   Problem: Pain Management: Goal: General experience of comfort will improve Outcome: Progressing   Problem: Clinical Measurements: Goal: Will remain free from infection Outcome: Progressing

## 2023-06-06 NOTE — Progress Notes (Signed)
 Physical Therapy Treatment Patient Details Name: Joel Solis MRN: [id number] DOB: April 18, 1946 Today's Date: 06/06/2023   History of Present Illness Patient is a 78 y.o. male with presented to the ED for evaluation of generalized weakness, pain all over, skin infection, admitted with cellulitis. PMH: hemorrhagic stroke with residual left hemiparesis (nonambulatory, uses power wheelchair at baseline), PAD, HTN, HLD, s/p VP shunt    PT Comments  Pt very willing to participate in PT session. Working on bed mobility, sitting EOB, and transfer to and from recliner with lateral scoot and squat pivot assist. Patient will benefit from continued inpatient follow up therapy, <3 hours/day    If plan is discharge home, recommend the following: Two people to help with walking and/or transfers;Two people to help with bathing/dressing/bathroom;Help with stairs or ramp for entrance;Assist for transportation;Assistance with cooking/housework   Can travel by private vehicle     No  Equipment Recommendations  None recommended by PT    Recommendations for Other Services       Precautions / Restrictions Precautions Precautions: Fall Precaution Comments: Hx CVA L side weakness/contractures in LUE and LLE with gross flexion Restrictions Weight Bearing Restrictions Per Provider Order: No     Mobility  Bed Mobility Overal bed mobility: Needs Assistance Bed Mobility: Supine to Sit     Supine to sit: Max assist Sit to supine: Max assist, +2 for physical assistance   General bed mobility comments: Required Max Assist for upper body as well as use of rail and HOB elevated a lot for supine to sit and heavy use of rails on right side.   Also required assist using bed pad to complete scooting to EOB.    Transfers Overall transfer level: Needs assistance Equipment used: None Transfers: Bed to chair/wheelchair/BSC            Lateral/Scoot Transfers: +2 physical assistance, +2 safety/equipment, Max  assist, Total assist General transfer comment: performed transfer to the recliner and back. Going from recliner back to bed still went to the right but diud not have drop arm on this side and was a bit more challenging for the patient    Ambulation/Gait Ambulation/Gait assistance:  (pt has not walked in many years , transfers only)                 Optometrist     Tilt Bed    Modified Rankin (Stroke Patients Only)       Balance Overall balance assessment: Needs assistance Sitting-balance support: Single extremity supported, Feet supported Sitting balance-Leahy Scale: Fair Sitting balance - Comments: static sitting was min A however unable to acept any perturbations without UE support or assistance.                                    Cognition Arousal: Alert   Overall Cognitive Status: Within Functional Limits for tasks assessed                                 General Comments: AxO x 3 pleasant and more willing to get OOB this session.  Feeling better. Stated he had his stroke in 2011.  He lives alone with his cat and a care giver comes a few times a week.  He is non amb but has a  power wc for mobility.  He also has a transfer pole floor to ceiling he uses to get in/OOB.        Exercises      General Comments        Pertinent Vitals/Pain Pain Assessment Pain Assessment: No/denies pain    Home Living                          Prior Function            PT Goals (current goals can now be found in the care plan section) Acute Rehab PT Goals Patient Stated Goal: I want to get a little strnger and do the things I used to be able to do. My contractures have gotten worse and difficult to transfer PT Goal Formulation: With patient Time For Goal Achievement: 06/10/23 Progress towards PT goals: Progressing toward goals    Frequency    Min 1X/week      PT Plan       Co-evaluation              AM-PAC PT 6 Clicks Mobility   Outcome Measure  Help needed turning from your back to your side while in a flat bed without using bedrails?: A Lot Help needed moving from lying on your back to sitting on the side of a flat bed without using bedrails?: Total Help needed moving to and from a bed to a chair (including a wheelchair)?: Total Help needed standing up from a chair using your arms (e.g., wheelchair or bedside chair)?: Total Help needed to walk in hospital room?: Total Help needed climbing 3-5 steps with a railing? : Total 6 Click Score: 7    End of Session Equipment Utilized During Treatment: Gait belt Activity Tolerance: Patient tolerated treatment well Patient left: with call bell/phone within reach;in bed;with bed alarm set Nurse Communication: Mobility status PT Visit Diagnosis: Hemiplegia and hemiparesis;Other abnormalities of gait and mobility (R26.89) Hemiplegia - Right/Left: Left Hemiplegia - caused by: Cerebral infarction     Time: 1420-1455 PT Time Calculation (min) (ACUTE ONLY): 35 min  Charges:    $Therapeutic Activity: 23-37 mins PT General Charges $$ ACUTE PT VISIT: 1 Visit                     Carlas Vandyne, PT, MPT Acute Rehabilitation Services Office: [phone] If a weekend: secure chat groups: [facility] PT, [facility] OT, [facility] SLP 06/06/2023    Jah Alarid 06/06/2023, 3:41 PM

## 2023-06-06 NOTE — Progress Notes (Addendum)
 PROGRESS NOTE  Joel Solis FMW:[id number] DOB: January 07, 1946 DOA: 05/25/2023 PCP: [facility]   LOS: 11 days   Brief narrative:  Joel Solis is a 78 y.o. male with medical history significant for hemorrhagic stroke with residual left hemiparesis, nonambulatory uses  power wheelchair at baseline), PAD, HTN, HLD, s/p VP shunt  presented to the ED for evaluation of generalized weakness and pain with infection in the skin.  Patient currently lives alone at home and has a wheelchair for ambulation but has been getting weaker even to get in his wheelchair now.  In the ED, patient had multiple areas of excoriation especially around the perineal area and legs and complained of shooting pain down the legs.  In the ED, vitals were stable.  Lab was notable for potassium low at 3.2.  COVID influenza and RSV was negative.  Chest x-ray was negative for infiltrate.  Patient was given IV Ancef  and potassium and was admitted to hospital with working diagnosis of cellulitis involving left groin and left foot.  Assessment/Plan:  Cellulitis of left groin and left foot: Completed 7-day course of IV cefazolin  during hospitalization.  Has improved at this time.   Failure to thrive/Progressive weakness. Poor functional status at baseline with worsening.  Lives alone and has a wheelchair.  Currently awaiting  for skilled nursing facility.    Hypokalemia: Improved after replacement.  Latest potassium was 3.4, continue to replenish.  Check BMP in AM.   History of hemorrhagic stroke with residual left hemiparesis Nonambulatory - uses power wheelchair at baseline: Continue aspirin  and statin.    Hypertension: Continue lisinopril , Lopressor , amlodipine .  Blood pressure seems to be stable.   Hyperlipidemia: Continue atorvastatin .   DVT prophylaxis: enoxaparin  (LOVENOX ) injection 40 mg Start: 05/26/23 1000  Disposition:  Awaiting for skilled nursing facility placement.  Medically  stable for disposition.  Status is: Inpatient  Remains inpatient appropriate because: Need for skilled nursing facility     Code Status:     Code Status: Full Code  Family Communication: None at bedside.  Spoke with the patient's daughter on the phone on 06/06/2023  Consultants: None  Procedures: None  Anti-infectives:  None   Anti-infectives (From admission, onward)    Start     Dose/Rate Route Frequency Ordered Stop   05/26/23 0000  ceFAZolin  (ANCEF ) IVPB 1 g/50 mL premix        1 g 100 mL/hr over 30 Minutes Intravenous Every 8 hours 05/25/23 2320 06/01/23 1531   05/25/23 2145  ceFAZolin  (ANCEF ) IVPB 1 g/50 mL premix  Status:  Discontinued        1 g 100 mL/hr over 30 Minutes Intravenous  Once 05/25/23 2143 05/25/23 2320      Subjective:  Today, patient was seen and examined at bedside.  Denies interval complaints except for mild leg discomfort on the left side.  Awaiting for disposition.  Objective: Vitals:   06/06/23 0855 06/06/23 0856  BP:  125/65  Pulse: 61   Resp:    Temp:    SpO2:      Intake/Output Summary (Last 24 hours) at 06/06/2023 1159 Last data filed at 06/06/2023 1000 Gross per 24 hour  Intake 2040 ml  Output 3350 ml  Net -1310 ml   Filed Weights   05/25/23 1717 05/25/23 1735  Weight: 68.1 kg 68 kg   Body mass index is 22.15 kg/m.   Physical Exam:  GENERAL: Patient is alert awake oriented HENT: No scleral pallor or icterus. Pupils equally  reactive to light. Oral mucosa is moist NECK: is supple, no gross swelling noted. CHEST: Clear to auscultation. No crackles or wheezes.   CVS: S1 and S2 heard, no murmur. Regular rate and rhythm.  ABDOMEN: Soft, non-tender, bowel sounds are present. EXTREMITIES: No edema. CNS: Cranial nerves are intact.  Left hemiparesis with contracture. SKIN: warm and dry without rashes.  Left lower extremity with erythema superficial skin erosion and mild cellulitis.  Improved   Data Review: I have personally  reviewed the following laboratory data and studies,  CBC: Recent Labs  Lab 06/02/23 0450 06/06/23 0441  WBC 8.0 7.1  HGB 13.4 12.8*  HCT 43.4 40.8  MCV 92.1 90.7  PLT 293 276   Basic Metabolic Panel: Recent Labs  Lab 06/02/23 0450 06/06/23 0441  NA 137 136  K 3.8 3.4*  CL 108 108  CO2 23 21*  GLUCOSE 91 81  BUN 24* 22  CREATININE 1.05 1.07  CALCIUM  8.1* 8.1*   Liver Function Tests: No results for input(s): AST, ALT, ALKPHOS, BILITOT, PROT, ALBUMIN in the last 168 hours.  No results for input(s): LIPASE, AMYLASE in the last 168 hours. No results for input(s): AMMONIA in the last 168 hours. Cardiac Enzymes: No results for input(s): CKTOTAL, CKMB, CKMBINDEX, TROPONINI in the last 168 hours. BNP (last 3 results) Recent Labs    10/24/22 0542 10/25/22 0439  BNP 448.0* 419.6*    ProBNP (last 3 results) No results for input(s): PROBNP in the last 8760 hours.  CBG: No results for input(s): GLUCAP in the last 168 hours. No results found for this or any previous visit (from the past 240 hours).    Studies: No results found.    Saphira Lahmann, MD  [facility] 06/06/2023  If 7PM-7AM, please contact night-coverage

## 2023-06-07 DIAGNOSIS — L03314 Cellulitis of groin: Secondary | ICD-10-CM | POA: Diagnosis not present

## 2023-06-07 LAB — BASIC METABOLIC PANEL
Anion gap: 10 (ref 5–15)
BUN: 24 mg/dL — ABNORMAL HIGH (ref 8–23)
CO2: 18 mmol/L — ABNORMAL LOW (ref 22–32)
Calcium: 8.3 mg/dL — ABNORMAL LOW (ref 8.9–10.3)
Chloride: 114 mmol/L — ABNORMAL HIGH (ref 98–111)
Creatinine, Ser: 1.09 mg/dL (ref 0.61–1.24)
GFR, Estimated: >60 mL/min (ref >60)
Glucose, Bld: 87 mg/dL (ref 70–99)
Potassium: 3.8 mmol/L (ref 3.5–5.1)
Sodium: 142 mmol/L (ref 135–145)

## 2023-06-07 NOTE — Progress Notes (Signed)
 Occupational Therapy Treatment Patient Details Name: Joel Solis MRN: [id number] DOB: 05-26-1945 Today's Date: 06/07/2023   History of present illness Patient is a 78 y.o. male with presented to the ED for evaluation of generalized weakness, pain all over, skin infection, admitted with cellulitis. PMH: hemorrhagic stroke with residual left hemiparesis (nonambulatory, uses power wheelchair at baseline), PAD, HTN, HLD, s/p VP shunt   OT comments  Patient was noted to need increased A for sitting balance on EOB. Patient needed education on hygiene with bed pan to reduce risks of contamination with keeping it at [facility]. Nurse and MD consulted via secure chat in regards to L hand skin integrity. Patient would need 24/7 caregiver support in next level of care. Patient will benefit from continued inpatient follow up therapy, <3 hours/day.       If plan is discharge home, recommend the following:  Two people to help with walking and/or transfers;A lot of help with bathing/dressing/bathroom;Assistance with cooking/housework;Direct supervision/assist for medications management;Direct supervision/assist for financial management;Help with stairs or ramp for entrance;Assist for transportation   Equipment Recommendations  None recommended by OT       Precautions / Restrictions Precautions Precautions: Fall Precaution Comments: Hx CVA L side weakness/contractures in LUE and LLE with gross flexion Restrictions Weight Bearing Restrictions Per Provider Order: No       Mobility Bed Mobility Overal bed mobility: Needs Assistance Bed Mobility: Supine to Sit, Sit to Supine     Supine to sit: Max assist Sit to supine: Max assist                 Balance Overall balance assessment: Needs assistance Sitting-balance support: Single extremity supported, Feet supported Sitting balance-Leahy Scale: Poor Sitting balance - Comments: declining for therapist to assist with positioning to improve sitting  balance. Postural control: Posterior lean           ADL either performed or assessed with clinical judgement   ADL Overall ADL's : Needs assistance/impaired         Upper Body Bathing: Bed level;Maximal assistance Upper Body Bathing Details (indicate cue type and reason): patient was max A for bathing tasks at bed level with patient unable to keep sitting balance on EOB on this date.     Upper Body Dressing : Moderate assistance;Bed level                     General ADL Comments: patient was noted to have continued skin breakdown in L hand with smell noted. nurse consulted again about this. message sent to MD and nurse in secure chat.      Cognition Arousal: Alert Behavior During Therapy: WFL for tasks assessed/performed Overall Cognitive Status: Within Functional Limits for tasks assessed           General Comments: patient needed increased education on hygiene with bed pan use and calling nursing for hygiene when feeling wet.                   Pertinent Vitals/ Pain       Pain Assessment Pain Assessment: No/denies pain         Frequency  Min 1X/week        Progress Toward Goals  OT Goals(current goals can now be found in the care plan section)  Progress towards OT goals: Progressing toward goals     Plan         AM-PAC OT "6 Clicks" Daily Activity     Outcome  Measure   Help from another person eating meals?: A Little Help from another person taking care of personal grooming?: A Little Help from another person toileting, which includes using toliet, bedpan, or urinal?: Total Help from another person bathing (including washing, rinsing, drying)?: Total Help from another person to put on and taking off regular upper body clothing?: A Lot Help from another person to put on and taking off regular lower body clothing?: Total 6 Click Score: 11    End of Session    OT Visit Diagnosis: Unsteadiness on feet (R26.81);Other abnormalities of  gait and mobility (R26.89);Muscle weakness (generalized) (M62.81);Pain   Activity Tolerance Patient limited by pain   Patient Left in bed;with call bell/phone within reach;with bed alarm set   Nurse Communication Other (comment) (concerns over patients skin integrity on L hand)        Time: 2130-8657 OT Time Calculation (min): 30 min  Charges: OT General Charges $OT Visit: 1 Visit OT Treatments $Therapeutic Activity: 23-37 mins  Joel Heckler, MS Acute Rehabilitation Department Office# [phone]   Joel Solis 06/07/2023, 4:22 PM

## 2023-06-07 NOTE — Progress Notes (Signed)
 [facility]   NOTE Joel Solis ZOX:[id number]  DOB: Nov 06, 1945  DOA: 05/25/2023  PCP: [facility]  06/07/2023,7:11 AM   LOS: 12 days      Code Status: Full code From: Home  current Dispo: Skilled     78 year old male Previous hemorrhagic stroke with left-sided hemiparesis (?  BP shunt) and power chair nonambulatory-prior gets home health 3 times a week HTN HLD peripheral arterial disease Nicotine  dependence Previous admission for AKI Enterococcus faecalis June 2024 Prior at med 2/2 recurrent lower extremity cellulitis  Events 05/25/2023 admit generalized weakness overall pain skin infection-2-week to get into wheelchair stuck in bed with soiled and wet clothing-chronic left arm left leg pain-BUN/creatinine 24/0.9 LFTs normal potassium 3.2-WBC 11.9 hemoglobin 13 platelet 262--- admission for cellulitis left groin and left foot-COVID influenza RSV negative CXR negative for infiltrate started on Ancef  admitted with cellulitis left groin and left foot  procedures 1/2 CXR on arrival no acute intrathoracic process  Plan  Left groin left foot cellulitis on admission Overall much improved-completed 7 days of antibiotics on 1/9 Ancef  Keep areas elevated Adult failure to thrive and progressive weakness Probably secondary to overall weakness in addition to prior stroke and inability to care for himself Mentally quite fit--- supplement as able Hypokalemia Resolved during hospital stay-periodic lab rechecks 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 Continues aspirin  81, atorvastatin  40 Needs shunt assessment as as needed looks stable at this time Take gabapentin  100 at bedtime for presumed neuropathy HTN Continues on amlodipine  10 lisinopril  20 metoprolol  100 twice daily-moderately controlled HLD/PAD Continues on aspirin  as above and statin  DVT prophylaxis: Lovenox   Status is: Inpatient Remains inpatient appropriate because:  Awaiting bed      Subjective: Coherent awake alert feels fair eating and drinking okay no chest pain no cough no fever no chills  Objective + exam Vitals:   06/06/23 0856 06/06/23 1416 06/06/23 2024 06/07/23 0529  BP: 125/65 118/62 121/61 139/76  Pulse:  67 68 60  Resp:  18 18 18   Temp:  98.5 F (36.9 C) 98 F (36.7 C) 97.6 F (36.4 C)  TempSrc:  Oral Oral Oral  SpO2:  98% 98% 97%  Weight:      Height:       Filed Weights   05/25/23 1717 05/25/23 1735  Weight: 68.1 kg 68 kg    Examination: EOMI NCAT no icterus no pallor Quite wasted appearance of lower extremities ROM intact moving 4 limbs equally without real deficit Pure wick in place  Data Reviewed: reviewed   CBC    Component Value Date/Time   WBC 7.1 06/06/2023 0441   RBC 4.50 06/06/2023 0441   HGB 12.8 (L) 06/06/2023 0441   HCT 40.8 06/06/2023 0441   PLT 276 06/06/2023 0441   MCV 90.7 06/06/2023 0441   MCH 28.4 06/06/2023 0441   MCHC 31.4 06/06/2023 0441   RDW 13.5 06/06/2023 0441   LYMPHSABS 0.8 10/25/2022 0439   MONOABS 0.3 10/25/2022 0439   EOSABS 0.0 10/25/2022 0439   BASOSABS 0.0 10/25/2022 0439      Latest Ref Rng & Units 06/06/2023    4:41 AM 06/02/2023    4:50 AM 05/27/2023    5:29 AM  CMP  Glucose 70 - 99 mg/dL 81  91  78   BUN 8 - 23 mg/dL 22  24  25    Creatinine 0.61 - 1.24 mg/dL 8.11  9.14  7.82   Sodium 135 - 145  mmol/L 136  137  137   Potassium 3.5 - 5.1 mmol/L 3.4  3.8  3.5   Chloride 98 - 111 mmol/L 108  108  104   CO2 22 - 32 mmol/L 21  23  26    Calcium  8.9 - 10.3 mg/dL 8.1  8.1  8.6     Scheduled Meds:  amLODipine   10 mg Oral Daily   aspirin  EC  81 mg Oral Daily   atorvastatin   40 mg Oral Daily   enoxaparin  (LOVENOX ) injection  40 mg Subcutaneous Q24H   gabapentin   100 mg Oral QHS   lisinopril   20 mg Oral Daily   metoprolol  tartrate  100 mg Oral BID   Continuous Infusions:  Time 26  Joel Laval Cafaro, MD  [facility]

## 2023-06-08 DIAGNOSIS — L03314 Cellulitis of groin: Secondary | ICD-10-CM | POA: Diagnosis not present

## 2023-06-08 MED ORDER — HYDROCODONE-ACETAMINOPHEN 5-325 MG PO TABS: 1.0000 | ORAL_TABLET | ORAL | 0 refills | Status: AC | PRN

## 2023-06-08 MED ORDER — ACETAMINOPHEN 325 MG PO TABS: 325.0000 mg | ORAL_TABLET | Freq: Four times a day (QID) | ORAL | Status: AC | PRN

## 2023-06-08 MED ORDER — GABAPENTIN 100 MG PO CAPS: 100.0000 mg | ORAL_CAPSULE | Freq: Every day | ORAL | 0 refills | Status: AC

## 2023-06-08 NOTE — Progress Notes (Signed)
Report called to [facility] [phone] and all questions were answered.

## 2023-06-08 NOTE — Discharge Summary (Signed)
Physician Discharge Summary  Joel Solis ZOX:[id number] DOB: Jan 30, 1946 DOA: 05/25/2023  PCP: [facility]  Admit date: 05/25/2023 Discharge date: 06/08/2023  Time spent: 42 minutes  Recommendations for Outpatient Follow-up:  Requires skilled level care for lower extremity cellulitis Check on left hand with contracture and may need to get someone to The nails at skilled facility carefully-dressings as below to bed of wound Supplement as able  Discharge Diagnoses:  MAIN problem for hospitalization   Lower extremity cellulitis Hand infection secondary to contractures and nail growing into palm  Please see below for itemized issues addressed in HOpsital- refer to other progress notes for clarity if needed  Discharge Condition: Improved  Diet recommendation: Heart healthy  Filed Weights   05/25/23 1717 05/25/23 1735 06/08/23 0558  Weight: 68.1 kg 68 kg 68.5 kg    History of present illness:  78 year old male Previous hemorrhagic stroke with left-sided hemiparesis (?  BP shunt) and power chair nonambulatory-prior gets home health 3 times a week HTN HLD peripheral arterial disease Nicotine dependence Previous admission for AKI Enterococcus faecalis June 2024 Prior at med 2/2 recurrent lower extremity cellulitis   Events 05/25/2023 admit generalized weakness overall pain skin infection-2-week to get into wheelchair stuck in bed with soiled and wet clothing-chronic left arm left leg pain-BUN/creatinine 24/0.9 LFTs normal potassium 3.2-WBC 11.9 hemoglobin 13 platelet 262--- admission for cellulitis left groin and left foot-COVID influenza RSV negative CXR negative for infiltrate started on Ancef admitted with cellulitis left groin and left foot   procedures 1/2 CXR on arrival no acute intrathoracic process   Plan   Left groin left foot cellulitis on admission Overall much improved-completed 7 days of antibiotics on 1/9 Ancef Keep areas elevated Wound  on left hand- Has an area on daughter's name at the fourth or third digit which is pressing on the palmar aspect of the hand and causing a pressure ulcer wound dressings as per below Will need someone to cut his nails Adult failure to thrive and progressive weakness Probably secondary to overall weakness in addition to prior stroke and inability to care for himself Mentally quite fit--- supplement as able Hypokalemia Resolved during hospital stay-periodic lab rechecks 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 Continues aspirin 81, atorvastatin 40 Needs shunt assessment as as needed looks stable at this time Take gabapentin 100 at bedtime for presumed neuropathy HTN Continues on amlodipine 10 lisinopril 20 metoprolol 100 twice daily-moderately controlled HLD/PAD Continues on aspirin as above and statin   Discharge Exam: Vitals:   06/07/23 1952 06/08/23 0558  BP: 123/63 130/71  Pulse: 65 60  Resp: 14 19  Temp: 97.9 F (36.6 C) 97.7 F (36.5 C)  SpO2: 99% 98%    Subj on day of d/c   Awake coherent no distress looks well feels fair No nausea no vomiting  General Exam on discharge  EOMI NCAT Some contractures lower extremity Very contracted left upper extremity with 3 cm round wound on palm where pressure of the nail comes from Some mask off ROM intact otherwise to right upper extremity Coherent alert  Discharge Instructions   Discharge Instructions     Diet - low sodium heart healthy   Complete by: As directed    Discharge wound care:   Complete by: As directed    L hand wound care Cut strip of silver hydrofiber (Aquacel Ag+) [place] [phone] and weave under finger  on left hand to attempt to place over wound, cover with  foam if possible. If not leave open with silver as wick.  Change every other day and PRN   Increase activity slowly   Complete by: As directed       Allergies as of 06/08/2023       Reactions   Oyster Shell  Diarrhea, Nausea And Vomiting, Other (See Comments)   GI Intolerance- Pt states he is allergic to the whole oyster, clams and mussels   Shellfish Allergy Diarrhea, Nausea And Vomiting        Medication List     TAKE these medications    acetaminophen 325 MG tablet Commonly known as: TYLENOL Take 1 tablet (325 mg total) by mouth every 6 (six) hours as needed for mild pain (pain score 1-3), headache or fever.   amLODipine 10 MG tablet Commonly known as: NORVASC Take 10 mg by mouth daily.   aspirin EC 81 MG tablet Take 81 mg by mouth daily. Swallow whole.   atorvastatin 40 MG tablet Commonly known as: LIPITOR Take 40 mg by mouth daily.   furosemide 20 MG tablet Commonly known as: LASIX Take 20 mg by mouth daily.   gabapentin 100 MG capsule Commonly known as: NEURONTIN Take 1 capsule (100 mg total) by mouth at bedtime.   HYDROcodone-acetaminophen 5-325 MG tablet Commonly known as: NORCO/VICODIN Take 1 tablet by mouth every 4 (four) hours as needed for moderate pain (pain score 4-6) or severe pain (pain score 7-10).   lisinopril 20 MG tablet Commonly known as: ZESTRIL Take 20 mg by mouth daily.   metoprolol tartrate 100 MG tablet Commonly known as: LOPRESSOR Take 100 mg by mouth 2 (two) times daily.               Discharge Care Instructions  (From admission, onward)           Start     Ordered   06/08/23 0000  Discharge wound care:       Comments: L hand wound care Cut strip of silver hydrofiber (Aquacel Ag+) [place] [phone] and weave under finger  on left hand to attempt to place over wound, cover with foam if possible. If not leave open with silver as wick.  Change every other day and PRN   06/08/23 1136           Allergies  Allergen Reactions   Oyster Shell Diarrhea, Nausea And Vomiting and Other (See Comments)    GI Intolerance- Pt states he is allergic to the whole oyster, clams and mussels   Shellfish Allergy Diarrhea and Nausea And Vomiting     Contact information for after-discharge care     Destination     [facility] .   Service: Skilled Nursing Contact information: [address] [place] [address] [phone]                      The results of significant diagnostics from this hospitalization (including imaging, microbiology, ancillary and laboratory) are listed below for reference.    Significant Diagnostic Studies: DG Chest Port 1 View Result Date: 05/25/2023 CLINICAL DATA:  Weakness EXAM: PORTABLE CHEST 1 VIEW COMPARISON:  10/23/2022 FINDINGS: 2 frontal views of the chest demonstrate an unremarkable cardiac silhouette. No airspace disease, effusion, or pneumothorax. No acute bony abnormalities. Ventriculostomy catheter tubing overlies the right chest. IMPRESSION: 1. No acute intrathoracic process. Electronically Signed   By: Sharlet Salina M.D.   On: 05/25/2023 20:32   CT Cervical Spine Wo  Contrast Result Date: 05/16/2023 CLINICAL DATA:  Poly trauma, blunt due to a fall EXAM: CT CERVICAL SPINE WITHOUT CONTRAST TECHNIQUE: Multidetector CT imaging of the cervical spine was performed without intravenous contrast. Multiplanar CT image reconstructions were also generated. RADIATION DOSE REDUCTION: This exam was performed according to the departmental dose-optimization program which includes automated exposure control, adjustment of the mA and/or kV according to patient size and/or use of iterative reconstruction technique. COMPARISON:  None Available. FINDINGS: Alignment: Normal. Skull base and vertebrae: No acute fracture. No primary bone lesion or focal pathologic process. Soft tissues and spinal canal: No prevertebral fluid or swelling. No visible canal hematoma. Disc levels: Degenerative changes with disc space narrowing and endplate osteophyte formation most prominent at C5-6 and C6-7 levels. Upper chest: Postoperative changes consistent with right carotid  endarterectomy. Ventricular shunt tubing is demonstrated in the right side of the neck. Other: None. IMPRESSION: Normal alignment of the cervical spine. No acute displaced fractures are demonstrated. Moderate degenerative changes. Electronically Signed   By: Burman Nieves M.D.   On: 05/16/2023 23:32   CT Maxillofacial Wo Contrast Result Date: 05/16/2023 CLINICAL DATA:  Blunt facial trauma due to a fall. EXAM: CT MAXILLOFACIAL WITHOUT CONTRAST TECHNIQUE: Multidetector CT imaging of the maxillofacial structures was performed. Multiplanar CT image reconstructions were also generated. RADIATION DOSE REDUCTION: This exam was performed according to the departmental dose-optimization program which includes automated exposure control, adjustment of the mA and/or kV according to patient size and/or use of iterative reconstruction technique. COMPARISON:  CT head 05/16/2023 FINDINGS: Osseous: No fracture or mandibular dislocation. No destructive process. Orbits: The globes and extraocular muscles appear intact and symmetrical. No retrobulbar infiltration. Sinuses: Paranasal sinuses and mastoid air cells are clear. Soft tissues: No soft tissue swelling, mass, or hematoma. Postoperative changes in the right carotid artery. Limited intracranial: No significant or unexpected finding. IMPRESSION: No acute displaced orbital or facial fractures are identified. Electronically Signed   By: Burman Nieves M.D.   On: 05/16/2023 23:30   CT Head Wo Contrast Result Date: 05/16/2023 CLINICAL DATA:  Poly trauma, blunt due to a fall. EXAM: CT HEAD WITHOUT CONTRAST TECHNIQUE: Contiguous axial images were obtained from the base of the skull through the vertex without intravenous contrast. RADIATION DOSE REDUCTION: This exam was performed according to the departmental dose-optimization program which includes automated exposure control, adjustment of the mA and/or kV according to patient size and/or use of iterative reconstruction  technique. COMPARISON:  MRI brain 11/16/2016.  CT head 11/15/2016 FINDINGS: Brain: Diffuse cerebral atrophy. Ventricular dilatation consistent with central atrophy. Low-attenuation changes in the deep white matter consistent with small vessel ischemia. No abnormal extra-axial fluid collections. No mass effect or midline shift. Gray-white matter junctions are distinct. Basal cisterns are not effaced. No acute intracranial hemorrhage. Right trans frontal ventricular shunt tube with tip in the medial right lateral ventricle. Vascular: No hyperdense vessel or unexpected calcification. Skull: Right frontal burr hole.  No acute depressed skull fractures. Sinuses/Orbits: Paranasal sinuses and mastoid air cells are clear. Other: None. IMPRESSION: No acute intracranial abnormalities. Chronic atrophy and small vessel ischemic changes. Right trans frontal ventricular shunt tube. Ventricles remain mildly dilated but without change since previous studies. Electronically Signed   By: Burman Nieves M.D.   On: 05/16/2023 23:28    Microbiology: No results found for this or any previous visit (from the past 240 hours).   Labs: Basic Metabolic Panel: Recent Labs  Lab 06/02/23 0450 06/06/23 0441 06/07/23 0452  NA 137 136 142  K 3.8 3.4* 3.8  CL 108 108 114*  CO2 23 21* 18*  GLUCOSE 91 81 87  BUN 24* 22 24*  CREATININE 1.05 1.07 1.09  CALCIUM 8.1* 8.1* 8.3*   Liver Function Tests: No results for input(s): "AST", "ALT", "ALKPHOS", "BILITOT", "PROT", "ALBUMIN" in the last 168 hours. No results for input(s): "LIPASE", "AMYLASE" in the last 168 hours. No results for input(s): "AMMONIA" in the last 168 hours. CBC: Recent Labs  Lab 06/02/23 0450 06/06/23 0441  WBC 8.0 7.1  HGB 13.4 12.8*  HCT 43.4 40.8  MCV 92.1 90.7  PLT 293 276   Cardiac Enzymes: No results for input(s): "CKTOTAL", "CKMB", "CKMBINDEX", "TROPONINI" in the last 168 hours. BNP: BNP (last 3 results) Recent Labs    10/24/22 0542  10/25/22 0439  BNP 448.0* 419.6*    ProBNP (last 3 results) No results for input(s): "PROBNP" in the last 8760 hours.  CBG: No results for input(s): "GLUCAP" in the last 168 hours.  Signed:  Rhetta Mura MD   [facility] 06/08/2023, 11:37 AM

## 2023-06-08 NOTE — TOC Transition Note (Signed)
Transition of Care [facility]) - Discharge Note   Patient Details  Name: Joel Solis MRN: [id number] Date of Birth: [birth year]  [facility]) CM/SW Contact:  Amada Jupiter, LCSW Phone Number: 06/08/2023, 1:04 PM   Clinical Narrative:     Notified by facility that they have secured insurance authorization.  MD has cleared pt for dc today.  Pt and daughter aware and agreeable.  PTAR called at 1:05pm.  RN to call report to [phone].  No further TOC needs.  Final next level of care: Skilled Nursing Facility Barriers to Discharge: Barriers Resolved   Patient Goals and CMS Choice Patient states their goals for this hospitalization and ongoing recovery are:: to dc to SNF rehab          Discharge Placement   Existing PASRR number confirmed : 05/31/23          Patient chooses bed at: Other - please specify in the comment section below: ( Rehab) Patient to be transferred to facility by: PTAR Name of family member notified: daughter, [name] Patient and family notified of of transfer: 06/08/23  Discharge Plan and Services Additional resources added to the After Visit Summary for   In-house Referral: Clinical Social Work   Post Acute Care Choice: Skilled Nursing Facility          DME Arranged: N/A DME Agency: NA                  Social Drivers of Health (SDOH) Interventions SDOH Screenings   Food Insecurity: No Food Insecurity (05/26/2023)  Housing: Low Risk  (05/26/2023)  Transportation Needs: Unmet Transportation Needs (05/26/2023)  Utilities: At Risk (05/26/2023)  Social Connections: Socially Isolated (05/26/2023)  Tobacco Use: High Risk (05/25/2023)     Readmission Risk Interventions    05/30/2023    2:53 PM  Readmission Risk Prevention Plan  Post Dischage Appt Complete  Medication Screening Complete  Transportation Screening Complete

## 2023-06-08 NOTE — Consult Note (Signed)
WOC Nurse Consult Note: Reason for Consult: wound under contracted finger, trauma from nail penetrating skin MD reports odor Wound type: trauma  Pressure Injury POA: NA Measurement: non measurable  Wound bed: see nursing flow sheets, MD notes Drainage (amount, consistency, odor) none documented Periwound: intact  Dressing procedure/placement/frequency: Silver hydrofiber strip, weave under contracted finger for contact with wound bed, cover with foam or leave open to air based on ability to lift nail off of wound.   Re consult if needed, will not follow at this time. Thanks  Anders Hohmann [employer], RN,CWOCN, CNS, CWON-AP [phone])

## 2023-10-03 ENCOUNTER — Telehealth: Payer: Self-pay | Admitting: Neurology

## 2023-10-03 NOTE — Telephone Encounter (Signed)
 [facility] called to reschedule appointment due to scheduling conflict.

## 2023-10-10 ENCOUNTER — Ambulatory Visit: Payer: Medicare Other | Admitting: Neurology

## 2024-02-01 ENCOUNTER — Encounter: Payer: Self-pay | Admitting: Neurology

## 2024-02-01 ENCOUNTER — Ambulatory Visit: Admitting: Neurology

## 2024-02-01 VITALS — BP 108/78 | HR 74 | Ht 69.0 in | Wt 167.0 lb

## 2024-02-01 DIAGNOSIS — I6930 Unspecified sequelae of cerebral infarction: Secondary | ICD-10-CM | POA: Diagnosis not present

## 2024-02-01 DIAGNOSIS — Z982 Presence of cerebrospinal fluid drainage device: Secondary | ICD-10-CM

## 2024-02-01 NOTE — Progress Notes (Unsigned)
 Chief Complaint  Patient presents with   New Patient (Initial Visit)    RM 14 Stroke 15 yrs ago, left side weak    ASSESSMENT AND PLAN  Joel Solis is a 78 y.o. male   Long history of right thalamus, basal ganglia hemorrhagic infarction, status post right frontal approach VP shunt, in 2011,  with residual severe spastic left hemiparesis,  He has developed fixed contraction of left fingers, cannot be straightened even with passive stretch,  I do not think he will benefit botulism toxin injection  Passive stretch, better hygiene, warm compression  DIAGNOSTIC DATA (LABS, IMAGING, TESTING) - I reviewed patient records, labs, notes, testing and imaging myself where available.   MEDICAL HISTORY:  Joel Solis, 78 year old male, brought in by nursing home for evaluation of left arm spasticity, seen in request by his primary care PA Emil Keen initial evaluation February 01, 2024  History is obtained from the patient and review of electronic medical records. I personally reviewed pertinent available imaging films in PACS.   PMHx of  Stroke with residual left hemiparesis since 2011 HTN HLD  Patient has been on resident of rehab center of [facility] since January 2025, he was able to provide limited history, per patient, he lived alone prior related to rehab admission  He suffered a stroke in 2011, with residual left hemiparesis, initially was able to live alone, but now has increased difficulty to take care of himself at home, with increased painful spasticity of left arm, left leg, no longer ambulatory  The staff reported is difficult to straighten out his left fingers, to have his nail cut,  Personally reviewed MRI of the brain in 2018, right frontal approach ventricular cath in place, no hydrocephalus, remote hemorrhagic right thalamic, basal ganglia infarction, with additional remote left thalamic lacunar infarction, 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  PHYSICAL EXAMNIATION:  Gen: NAD, conversant, well nourised, well groomed                     Cardiovascular: Regular rate rhythm, no peripheral edema, warm, nontender. Eyes: Conjunctivae clear without exudates or hemorrhage Neck: Supple, no carotid bruits. Pulmonary: Clear to auscultation bilaterally   NEUROLOGICAL EXAM:  MENTAL STATUS: Speech/cognition: In wheelchair, mild slurred speech, cooperative on history taking and examination CRANIAL NERVES: CN II: Visual fields are full to confrontation. Pupils are round equal and briskly reactive to light. CN III, IV, VI: extraocular movement are normal. No ptosis. CN V: Facial sensation is intact to light touch CN VII: Left lower face weakness CN VIII: Hearing is normal to causal conversation. CN IX, X: Phonation is normal. CN XI: Head turning and shoulder shrug are intact  MOTOR: Spastic hemiparesis, no significant movement of left upper extremity, left shoulder abduction, pronation, fixed contraction of finger flexion, could not straight out fingers with passive stretch, complains of significant pain, Poor hygiene of left hand,  Antigravity movement of left lower extremity, fixed contraction of left knee 150 degree,  REFLEXES: Hyperreflexia of left upper and lower extremity  SENSORY: Intact to light touch, pinprick and vibratory sensation are intact in fingers and toes.  COORDINATION: There is no trunk or limb dysmetria noted.  GAIT/STANCE: Deferred  REVIEW OF SYSTEMS:  Full 14  system review of systems performed and notable only for as above All other review of systems were negative.   ALLERGIES: Allergies  Allergen Reactions   Oyster Shell Diarrhea, Nausea And Vomiting and Other (See Comments)    GI Intolerance- Pt states he is allergic to the whole oyster, clams and mussels   Shellfish Allergy Diarrhea and Nausea And Vomiting    HOME MEDICATIONS: Current Outpatient Medications  Medication Sig Dispense Refill   acetaminophen  (TYLENOL ) 325 MG tablet Take 1 tablet (325 mg total) by mouth every 6 (six) hours as needed for mild pain (pain score 1-3), headache or fever.     amLODipine  (NORVASC ) 10 MG tablet Take 10 mg by mouth daily.     aspirin  EC 81 MG tablet Take 81 mg by mouth daily. Swallow whole.     atorvastatin  (LIPITOR) 40 MG tablet Take 40 mg by mouth daily.     furosemide  (LASIX ) 20 MG tablet Take 20 mg by mouth daily.     gabapentin  (NEURONTIN ) 100 MG capsule Take 1 capsule (100 mg total) by mouth at bedtime. 15 capsule 0   HYDROcodone -acetaminophen  (NORCO/VICODIN) 5-325 MG tablet Take 1 tablet by mouth every 4 (four) hours as needed for moderate pain (pain score 4-6) or severe pain (pain score 7-10). 15 tablet 0   lisinopril  (ZESTRIL ) 20 MG tablet Take 20 mg by mouth daily.     methocarbamol (ROBAXIN) 500 MG tablet Take 500 mg by mouth 4 (four) times daily.     metoprolol  tartrate (LOPRESSOR ) 100 MG tablet Take 100 mg by mouth 2 (two) times daily.     No current facility-administered medications for this visit.    PAST MEDICAL HISTORY: Past Medical History:  Diagnosis Date   Essential hypertension    Hemorrhagic stroke (HCC)    HLD (hyperlipidemia)    S/P VP shunt    Stroke (HCC)    Tobacco abuse     PAST SURGICAL HISTORY: Past Surgical History:  Procedure Laterality Date   cyst      cyst in neck   VENTRICULOPERITONEAL SHUNT      FAMILY HISTORY: Family History  Problem Relation Age of Onset   Intracerebral hemorrhage Mother     Lung cancer Father    Esophageal cancer Brother     SOCIAL HISTORY: Social History   Socioeconomic History   Marital status: Widowed    Spouse name: Not on file   Number of children: Not on file   Years of education: Not on file   Highest education level: Not on file  Occupational History   Not on file  Tobacco Use   Smoking status: Every Day    Current packs/day: 0.00    Average packs/day: 1 pack/day for 50.0 years (50.0 ttl pk-yrs)    Types: Cigarettes    Start date: 09/12/1967    Last attempt to quit: 09/11/2017    Years since quitting: 6.3   Smokeless tobacco: Never  Vaping Use   Vaping status: Never Used  Substance and Sexual Activity   Alcohol use: Not Currently    Comment: not drank in 10 months   Drug use: No   Sexual activity: Never  Other Topics Concern   Not on file  Social  History Narrative   Right handed    Lives in a facility   Social Drivers of Health   Financial Resource Strain: Not on file  Food Insecurity: No Food Insecurity (05/26/2023)   Hunger Vital Sign    Worried About Running Out of Food in the Last Year: Never true    Ran Out of Food in the Last Year: Never true  Transportation Needs: Unmet Transportation Needs (05/26/2023)   PRAPARE - Administrator, Civil Service (Medical): Yes    Lack of Transportation (Non-Medical): Yes  Physical Activity: Not on file  Stress: Not on file  Social Connections: Socially Isolated (05/26/2023)   Social Connection and Isolation Panel    Frequency of Communication with Friends and Family: Three times a week    Frequency of Social Gatherings with Friends and Family: Once a week    Attends Religious Services: Never    Database administrator or Organizations: No    Attends Banker Meetings: Never    Marital Status: Widowed  Intimate Partner Violence: Not At Risk (05/26/2023)   Humiliation, Afraid, Rape, and Kick questionnaire    Fear of Current or Ex-Partner: No    Emotionally Abused: No     Physically Abused: No    Sexually Abused: No      Modena Callander, M.D. Ph.D.  [facility] [address] [place], [place] [address] Ph: [phone] Fax: [phone]  CC:  Emil Heather FALCON, PA-C [address] [place],  [place] [address]  [facility]
